# Patient Record
Sex: Male | Born: 1945 | Race: White | Hispanic: No | Marital: Single | Smoking: Current every day smoker
Health system: Southern US, Community
[De-identification: ages and names within clinical notes are randomized; demographics above are authoritative.]

## PROBLEM LIST (undated history)

## (undated) DIAGNOSIS — Z9289 Personal history of other medical treatment: Secondary | ICD-10-CM

## (undated) DIAGNOSIS — J449 Chronic obstructive pulmonary disease, unspecified: Secondary | ICD-10-CM

## (undated) DIAGNOSIS — C3490 Malignant neoplasm of unspecified part of unspecified bronchus or lung: Secondary | ICD-10-CM

## (undated) DIAGNOSIS — R918 Other nonspecific abnormal finding of lung field: Secondary | ICD-10-CM

## (undated) DIAGNOSIS — J189 Pneumonia, unspecified organism: Secondary | ICD-10-CM

## (undated) DIAGNOSIS — J45909 Unspecified asthma, uncomplicated: Secondary | ICD-10-CM

## (undated) HISTORY — PX: LUNG LOBECTOMY: SHX167

## (undated) SURGERY — Surgical Case
Anesthesia: *Unknown

---

## 1988-01-13 DIAGNOSIS — C3490 Malignant neoplasm of unspecified part of unspecified bronchus or lung: Secondary | ICD-10-CM

## 1988-01-13 HISTORY — DX: Malignant neoplasm of unspecified part of unspecified bronchus or lung: C34.90

## 2013-11-12 DIAGNOSIS — J189 Pneumonia, unspecified organism: Secondary | ICD-10-CM

## 2013-11-12 HISTORY — DX: Pneumonia, unspecified organism: J18.9

## 2013-11-21 ENCOUNTER — Inpatient Hospital Stay: Payer: Self-pay | Admitting: Internal Medicine

## 2013-11-21 DIAGNOSIS — Z85118 Personal history of other malignant neoplasm of bronchus and lung: Secondary | ICD-10-CM | POA: Diagnosis not present

## 2013-11-21 DIAGNOSIS — J189 Pneumonia, unspecified organism: Secondary | ICD-10-CM | POA: Diagnosis not present

## 2013-11-21 DIAGNOSIS — J180 Bronchopneumonia, unspecified organism: Secondary | ICD-10-CM | POA: Diagnosis not present

## 2013-11-21 DIAGNOSIS — F17210 Nicotine dependence, cigarettes, uncomplicated: Secondary | ICD-10-CM | POA: Diagnosis not present

## 2013-11-21 DIAGNOSIS — R590 Localized enlarged lymph nodes: Secondary | ICD-10-CM | POA: Diagnosis not present

## 2013-11-21 DIAGNOSIS — J9601 Acute respiratory failure with hypoxia: Secondary | ICD-10-CM | POA: Diagnosis not present

## 2013-11-21 DIAGNOSIS — Z902 Acquired absence of lung [part of]: Secondary | ICD-10-CM | POA: Diagnosis present

## 2013-11-21 DIAGNOSIS — A419 Sepsis, unspecified organism: Secondary | ICD-10-CM | POA: Diagnosis not present

## 2013-11-21 DIAGNOSIS — J441 Chronic obstructive pulmonary disease with (acute) exacerbation: Secondary | ICD-10-CM | POA: Diagnosis not present

## 2013-11-21 DIAGNOSIS — R6520 Severe sepsis without septic shock: Secondary | ICD-10-CM | POA: Diagnosis not present

## 2013-11-21 DIAGNOSIS — J440 Chronic obstructive pulmonary disease with acute lower respiratory infection: Secondary | ICD-10-CM | POA: Diagnosis present

## 2013-11-21 DIAGNOSIS — Z720 Tobacco use: Secondary | ICD-10-CM | POA: Diagnosis not present

## 2013-11-21 DIAGNOSIS — R093 Abnormal sputum: Secondary | ICD-10-CM | POA: Diagnosis present

## 2013-11-21 LAB — COMPREHENSIVE METABOLIC PANEL
ALBUMIN: 2.9 g/dL — AB (ref 3.4–5.0)
ALT: 78 U/L — AB
ANION GAP: 8 (ref 7–16)
Alkaline Phosphatase: 95 U/L
BUN: 12 mg/dL (ref 7–18)
Bilirubin,Total: 0.5 mg/dL (ref 0.2–1.0)
CHLORIDE: 98 mmol/L (ref 98–107)
CREATININE: 0.64 mg/dL (ref 0.60–1.30)
Calcium, Total: 9 mg/dL (ref 8.5–10.1)
Co2: 31 mmol/L (ref 21–32)
EGFR (African American): >60
Glucose: 100 mg/dL — ABNORMAL HIGH (ref 65–99)
Osmolality: 274 (ref 275–301)
Potassium: 3.8 mmol/L (ref 3.5–5.1)
SGOT(AST): 49 U/L — ABNORMAL HIGH (ref 15–37)
SODIUM: 137 mmol/L (ref 136–145)
Total Protein: 8.5 g/dL — ABNORMAL HIGH (ref 6.4–8.2)

## 2013-11-21 LAB — CBC
HCT: 45.2 % (ref 40.0–52.0)
HGB: 14.5 g/dL (ref 13.0–18.0)
MCH: 26.9 pg (ref 26.0–34.0)
MCHC: 32.1 g/dL (ref 32.0–36.0)
MCV: 84 fL (ref 80–100)
Platelet: 478 10*3/uL — ABNORMAL HIGH (ref 150–440)
RBC: 5.4 10*6/uL (ref 4.40–5.90)
RDW: 13.3 % (ref 11.5–14.5)
WBC: 19.7 10*3/uL — AB (ref 3.8–10.6)

## 2013-11-21 LAB — TROPONIN I: Troponin-I: <0.02 ng/mL

## 2013-11-22 DIAGNOSIS — J9601 Acute respiratory failure with hypoxia: Secondary | ICD-10-CM | POA: Diagnosis not present

## 2013-11-22 DIAGNOSIS — Z720 Tobacco use: Secondary | ICD-10-CM | POA: Diagnosis not present

## 2013-11-22 DIAGNOSIS — Z85118 Personal history of other malignant neoplasm of bronchus and lung: Secondary | ICD-10-CM | POA: Diagnosis not present

## 2013-11-22 DIAGNOSIS — J189 Pneumonia, unspecified organism: Secondary | ICD-10-CM | POA: Diagnosis not present

## 2013-11-22 LAB — BASIC METABOLIC PANEL
Anion Gap: 7 (ref 7–16)
BUN: 13 mg/dL (ref 7–18)
CO2: 30 mmol/L (ref 21–32)
Calcium, Total: 8.4 mg/dL — ABNORMAL LOW (ref 8.5–10.1)
Chloride: 101 mmol/L (ref 98–107)
Creatinine: 0.67 mg/dL (ref 0.60–1.30)
EGFR (Non-African Amer.): >60
Glucose: 143 mg/dL — ABNORMAL HIGH (ref 65–99)
Osmolality: 278 (ref 275–301)
POTASSIUM: 4.2 mmol/L (ref 3.5–5.1)
Sodium: 138 mmol/L (ref 136–145)

## 2013-11-22 LAB — CBC WITH DIFFERENTIAL/PLATELET
BASOS ABS: 0 10*3/uL (ref 0.0–0.1)
BASOS PCT: 0.2 %
EOS ABS: 0 10*3/uL (ref 0.0–0.7)
Eosinophil %: 0 %
HCT: 37.1 % — ABNORMAL LOW (ref 40.0–52.0)
HGB: 11.9 g/dL — AB (ref 13.0–18.0)
LYMPHS ABS: 1.7 10*3/uL (ref 1.0–3.6)
LYMPHS PCT: 13.3 %
MCH: 26.9 pg (ref 26.0–34.0)
MCHC: 32 g/dL (ref 32.0–36.0)
MCV: 84 fL (ref 80–100)
Monocyte #: 0.2 x10 3/mm (ref 0.2–1.0)
Monocyte %: 1.6 %
Neutrophil #: 10.6 10*3/uL — ABNORMAL HIGH (ref 1.4–6.5)
Neutrophil %: 84.9 %
PLATELETS: 397 10*3/uL (ref 150–440)
RBC: 4.41 10*6/uL (ref 4.40–5.90)
RDW: 13.3 % (ref 11.5–14.5)
WBC: 12.5 10*3/uL — ABNORMAL HIGH (ref 3.8–10.6)

## 2013-11-26 LAB — CULTURE, BLOOD (SINGLE)

## 2013-11-27 LAB — CULTURE, BLOOD (SINGLE)

## 2013-11-29 DIAGNOSIS — J158 Pneumonia due to other specified bacteria: Secondary | ICD-10-CM | POA: Diagnosis not present

## 2013-11-29 DIAGNOSIS — Z125 Encounter for screening for malignant neoplasm of prostate: Secondary | ICD-10-CM | POA: Diagnosis not present

## 2013-11-29 DIAGNOSIS — R5383 Other fatigue: Secondary | ICD-10-CM | POA: Diagnosis not present

## 2013-11-29 DIAGNOSIS — G4701 Insomnia due to medical condition: Secondary | ICD-10-CM | POA: Diagnosis not present

## 2013-11-29 DIAGNOSIS — E756 Lipid storage disorder, unspecified: Secondary | ICD-10-CM | POA: Diagnosis not present

## 2013-11-29 DIAGNOSIS — R0602 Shortness of breath: Secondary | ICD-10-CM | POA: Diagnosis not present

## 2013-11-29 DIAGNOSIS — F17210 Nicotine dependence, cigarettes, uncomplicated: Secondary | ICD-10-CM | POA: Diagnosis not present

## 2013-11-29 DIAGNOSIS — R29890 Loss of height: Secondary | ICD-10-CM | POA: Diagnosis not present

## 2013-12-12 DIAGNOSIS — J158 Pneumonia due to other specified bacteria: Secondary | ICD-10-CM | POA: Diagnosis not present

## 2013-12-12 DIAGNOSIS — R0602 Shortness of breath: Secondary | ICD-10-CM | POA: Diagnosis not present

## 2013-12-12 DIAGNOSIS — G4701 Insomnia due to medical condition: Secondary | ICD-10-CM | POA: Diagnosis not present

## 2013-12-12 DIAGNOSIS — R29890 Loss of height: Secondary | ICD-10-CM | POA: Diagnosis not present

## 2013-12-13 DIAGNOSIS — R0602 Shortness of breath: Secondary | ICD-10-CM | POA: Diagnosis not present

## 2013-12-27 ENCOUNTER — Ambulatory Visit: Payer: Self-pay | Admitting: Internal Medicine

## 2013-12-27 DIAGNOSIS — J449 Chronic obstructive pulmonary disease, unspecified: Secondary | ICD-10-CM | POA: Diagnosis not present

## 2013-12-27 DIAGNOSIS — R634 Abnormal weight loss: Secondary | ICD-10-CM | POA: Diagnosis not present

## 2013-12-27 DIAGNOSIS — J158 Pneumonia due to other specified bacteria: Secondary | ICD-10-CM | POA: Diagnosis not present

## 2013-12-27 DIAGNOSIS — F17210 Nicotine dependence, cigarettes, uncomplicated: Secondary | ICD-10-CM | POA: Diagnosis not present

## 2013-12-27 DIAGNOSIS — J439 Emphysema, unspecified: Secondary | ICD-10-CM | POA: Diagnosis not present

## 2013-12-27 DIAGNOSIS — R918 Other nonspecific abnormal finding of lung field: Secondary | ICD-10-CM | POA: Diagnosis not present

## 2014-01-25 ENCOUNTER — Ambulatory Visit: Payer: Self-pay | Admitting: Internal Medicine

## 2014-05-05 NOTE — Discharge Summary (Signed)
PATIENT NAME:  Rick Clark, Rick Clark MR#:  [id number] DATE OF BIRTH:  27-Mar-1945  DATE OF ADMISSION:  11/21/2013 DATE OF DISCHARGE:  11/22/2013  PRESENTING COMPLAINT: Shortness of breath.   DISCHARGE DIAGNOSES:  1.  Acute chronic obstructive pulmonary disease exacerbation with emphysema and ongoing tobacco abuse.  2.  Bilateral pneumonia improving.  3.  Chronic tobacco abuse. Saturations 97% on room air.   MEDICATIONS:  1.  Keflex 500 mg p.o. 3 times a day.  2.  Azithromycin 500 mg p.o. daily. 3.  Symbicort 160/4.5 at 1 inhalation b.i.d.  4.  Spiriva 18 mcg inhalation daily.  5.  Ventolin HFA 2 puffs 4 times a day as needed.  6.  Prednisone taper as directed.  7.  Establish primary care physician in the area.   FOLLOWUP: With [facility] on 11/29/2013 at 2:15 p.m.    LABORATORY DATA: At discharge: White count is 12.5, hemoglobin and hematocrit 11.9 and 37.1 and a platelet count is 397,000.  Creatinine is 0.69.  CT of the chest shows no evidence of PE bilateral lower lobe and lingular bronchopneumonia.  There is mediastinal left hilar lymphadenopathy likely reactive, given the pulmonary findings.  Blood cultures negative in 18 to 24 hours.  Blood cultures 1 out of 2, gram-positive cocci in cluster and second culture is negative in 18 to 24 hours.   CONSULTATIONS: None.    BRIEF SUMMARY OF HOSPITAL COURSE:  [name] is a 69 year old Caucasian male with past medical history of lung cancer, status post partial pneumonectomy and ongoing tobacco abuse, comes in with increasing shortness of breath for the past 2 weeks. He has been having some cough.  He was initially hypoxic in the Emergency Room.  He was admitted with:  1.  Acute hypoxic respiratory failure secondary to chronic obstructive pulmonary disease exacerbation and was started on IV steroids, high dose, along with oral inhalers and nebulizers. The patient improved.  His saturations were 97% on room air.  His shortness of  breath was much better.  He was changed to p.o. prednisone taper and given inhalers to go home.   2.  Bilateral bronchopneumonia.   The patient was started on IV Levaquin, and since he was not able to afford it, he was changed to p.o. Keflex and Zithromax. Blood cultures 1 out of 2 was positive for gram-positive cocci  which likely is staph coag-negative coag negative. He remained afebrile. White count was down to 12,000.  3.  Ongoing tobacco abuse. The patient was advised on smoking cessation.  4.  History of lung cancer, status post partial pneumonectomy on the right.   Overall hospital stay remained stable.   CODE STATUS: The patient remained a FULL CODE.  He has been set up an appointment with [facility] to establish primary care.   TIME SPENT: 40 minutes.     ____________________________ Hart Rochester Posey Pronto, MD sap:DT D: 11/23/2013 07:03:13 ET T: 11/23/2013 11:58:13 ET JOB#: [id number]  cc: Aniqua Briere A. Posey Pronto, MD, <Dictator> [facility]   Ilda Basset MD ELECTRONICALLY SIGNED 12/14/2013 14:03

## 2014-05-05 NOTE — H&P (Signed)
PATIENT NAME:  Rick Clark, Rick Clark MR#:  [id number] DATE OF BIRTH:  [birth year]  DATE OF ADMISSION:  11/21/2013  PRIMARY CARE PHYSICIAN: The patient does not have one.  CHIEF COMPLAINT: Shortness of breath.   HISTORY OF PRESENT ILLNESS: This is a 69 year old male who presents to the hospital complaining of shortness of breath and weakness progressively getting worse over the past 2 weeks. The patient describes his symptoms as having significant exertional dyspnea now on minimal exertion. He admits to a cough but is nonproductive. Because his symptoms have progressively gotten worse, he came to the ER for further evaluation. In the Emergency Room, the patient was noted to be hypoxic with room air O2 sats in the 80s. His chest x-ray findings were suggestive of multifocal pneumonia. Hospitalist services were contacted for further treatment and evaluation. The patient denies any chest pain, any nausea, vomiting, abdominal pain, diarrhea, fevers, chills, or any other associated symptoms presently.   REVIEW OF SYSTEMS: CONSTITUTIONAL: No documented fever. Positive weakness. Positive weight loss, unknown how much amount. No weight gain.  EYES: No blurred or double vision.  ENT: No tinnitus. No postnasal drip. No redness of the oropharynx.  RESPIRATORY: Positive cough. No wheeze. No hemoptysis. Positive dyspnea.  CARDIOVASCULAR: No chest pain, no orthopnea, no palpitations, or syncope.  GASTROINTESTINAL: No nausea, no vomiting, no diarrhea, no abdominal pain, and no melena or hematochezia.  GENITOURINARY: No dysuria or hematuria.  ENDOCRINE: No polyuria or nocturia. No heat or cold intolerance.  HEMATOLOGIC: No anemia, no bruising, no bleeding.  INTEGUMENTARY: No rashes or lesions.  MUSCULOSKELETAL: No arthritis. No swelling. No gout.  NEUROLOGIC: No numbness or tingling. No ataxia. No seizure type activity. PSYCH: No anxiety. No insomnia. No ADD.   PAST MEDICAL HISTORY: Consistent with history of lung  cancer status post partial pneumonectomy, history of ongoing tobacco abuse.   ALLERGIES: No known drug allergies.   SOCIAL HISTORY: Still smokes about a pack per day, has been smoking for the past 40+ years. No alcohol abuse. No illicit drug abuse. Lives with his granddaughter.   FAMILY HISTORY: Mother and father are both deceased. Mother was diabetic. Father died from old age.   CURRENT MEDICATIONS: He is currently on no medications.   PHYSICAL EXAMINATION: Presently is as follows:  VITAL SIGNS: Temperature 97.8, pulse 83, respirations 18, blood pressure 93/52, sats 83% on room air.  GENERAL: He is a cachectic emaciated male but in no apparent distress.  HEAD, EYES, EARS, NOSE AND THROAT: He is atraumatic, normocephalic. Extraocular muscles are intact. Pupils are equal and reactive to light. Sclerae anicteric. No conjunctival injection. No pharyngeal erythema.  NECK: Supple. There is no jugular venous distention. No bruits. No lymphadenopathy or thyromegaly.  HEART: Regular rate and rhythm. Distant heart sounds. No murmurs, no rubs, no clicks.  LUNGS: Prolonged inspiratory and expiratory phase, minimal end-expiratory wheezing. No rhonchi. No rales. Negative use of accessory muscles. No dullness to percussion.  ABDOMEN: Soft, flat, nontender, nondistended. Has good bowel sounds. No hepatosplenomegaly appreciated.  EXTREMITIES: No evidence of any cyanosis. Positive clubbing. No peripheral edema. +2 pedal and radial pulses bilaterally.  NEUROLOGIC: The patient is alert, awake and oriented x3 with no focal motor or sensory deficits appreciated bilaterally.  SKIN: Moist and warm with no rashes appreciated.  LYMPHATIC: There is no cervical or axillary lymphadenopathy.   DIAGNOSTIC DATA: Serum glucose 100, BUN 12, creatinine 0.6, sodium 137, potassium 3.8, chloride 98, bicarb 31. LFTs are within normal limits. Troponin less than 0.02. White  cell count 8.7, hemoglobin 14.5, hematocrit 45.2, platelet  count 478,000.   The patient did have a chest x-ray done which shows partial pneumonectomy on the right, patchy bronchopneumonia in both mid and lower lung fields.   ASSESSMENT AND PLAN: This is a 69 year old male with a history of lung cancer status post partial pneumonectomy, ongoing tobacco abuse, presents to the hospital with shortness of breath, weakness and noted to be in chronic obstructive pulmonary disease exacerbation due to pneumonia.  1.  Chronic obstructive pulmonary disease exacerbation. This is likely due to multifocal pneumonia. I will treat the patient with IV steroids, around-the-clock nebulizer treatments, start him on some Symbicort and Spiriva, and give him IV Levaquin for his pneumonia. Follow him clinically. We will assess him for home oxygen prior to discharge.  2.  Pneumonia. This is likely community-acquired pneumonia. Will treat the patient with IV Levaquin, follow blood and sputum cultures.  3.  Leukocytosis, secondary to the pneumonia. We will follow white cell count with IV antibiotic therapy.  4.  History of lung cancer status post pneumonectomy. I will get a CT chest with contrast to rule out underlying lung mass possibly, also work-up for possible underlying pulmonary embolism.   CODE STATUS: FULL.  TIME SPENT ON ADMISSION: 50 minutes.  ____________________________ Belia Heman. Verdell Carmine, MD vjs:sb D: 11/21/2013 67:20:94 ET T: 11/21/2013 14:34:10 ET JOB#: [id number]  cc: Belia Heman. Verdell Carmine, MD, <Dictator> Henreitta Leber MD ELECTRONICALLY SIGNED 12/01/2013 12:39

## 2014-06-22 ENCOUNTER — Other Ambulatory Visit: Payer: Self-pay | Admitting: Physician Assistant

## 2014-06-22 ENCOUNTER — Inpatient Hospital Stay
Admission: EM | Admit: 2014-06-22 | Discharge: 2014-07-02 | DRG: 193 | Disposition: A | Discharge status: Home Health | Payer: PPO | Attending: Internal Medicine | Admitting: Internal Medicine

## 2014-06-22 ENCOUNTER — Ambulatory Visit
Admission: RE | Admit: 2014-06-22 | Discharge: 2014-06-22 | Disposition: A | Discharge status: Home / Self Care | Payer: PPO | Source: Ambulatory Visit | Attending: Physician Assistant | Admitting: Physician Assistant

## 2014-06-22 ENCOUNTER — Encounter: Payer: Self-pay | Admitting: Emergency Medicine

## 2014-06-22 DIAGNOSIS — F17210 Nicotine dependence, cigarettes, uncomplicated: Secondary | ICD-10-CM | POA: Diagnosis present

## 2014-06-22 DIAGNOSIS — R0602 Shortness of breath: Secondary | ICD-10-CM

## 2014-06-22 DIAGNOSIS — Z7951 Long term (current) use of inhaled steroids: Secondary | ICD-10-CM | POA: Diagnosis not present

## 2014-06-22 DIAGNOSIS — Z8249 Family history of ischemic heart disease and other diseases of the circulatory system: Secondary | ICD-10-CM

## 2014-06-22 DIAGNOSIS — R079 Chest pain, unspecified: Secondary | ICD-10-CM

## 2014-06-22 DIAGNOSIS — J189 Pneumonia, unspecified organism: Secondary | ICD-10-CM | POA: Diagnosis present

## 2014-06-22 DIAGNOSIS — J449 Chronic obstructive pulmonary disease, unspecified: Secondary | ICD-10-CM | POA: Diagnosis not present

## 2014-06-22 DIAGNOSIS — R0781 Pleurodynia: Secondary | ICD-10-CM

## 2014-06-22 DIAGNOSIS — J918 Pleural effusion in other conditions classified elsewhere: Secondary | ICD-10-CM

## 2014-06-22 DIAGNOSIS — R627 Adult failure to thrive: Secondary | ICD-10-CM | POA: Diagnosis present

## 2014-06-22 DIAGNOSIS — J90 Pleural effusion, not elsewhere classified: Secondary | ICD-10-CM | POA: Diagnosis not present

## 2014-06-22 DIAGNOSIS — Z79899 Other long term (current) drug therapy: Secondary | ICD-10-CM

## 2014-06-22 DIAGNOSIS — Z85118 Personal history of other malignant neoplasm of bronchus and lung: Secondary | ICD-10-CM | POA: Diagnosis not present

## 2014-06-22 DIAGNOSIS — Z681 Body mass index (BMI) 19 or less, adult: Secondary | ICD-10-CM

## 2014-06-22 DIAGNOSIS — J181 Lobar pneumonia, unspecified organism: Secondary | ICD-10-CM

## 2014-06-22 DIAGNOSIS — E43 Unspecified severe protein-calorie malnutrition: Secondary | ICD-10-CM | POA: Diagnosis present

## 2014-06-22 DIAGNOSIS — J948 Other specified pleural conditions: Secondary | ICD-10-CM | POA: Diagnosis not present

## 2014-06-22 DIAGNOSIS — J869 Pyothorax without fistula: Secondary | ICD-10-CM | POA: Diagnosis not present

## 2014-06-22 HISTORY — DX: Pneumonia, unspecified organism: J18.9

## 2014-06-22 HISTORY — DX: Malignant neoplasm of unspecified part of unspecified bronchus or lung: C34.90

## 2014-06-22 LAB — COMPREHENSIVE METABOLIC PANEL
ALK PHOS: 82 U/L (ref 38–126)
ALT: 11 U/L — AB (ref 17–63)
ANION GAP: 10 (ref 5–15)
AST: 16 U/L (ref 15–41)
Albumin: 3.4 g/dL — ABNORMAL LOW (ref 3.5–5.0)
BUN: 13 mg/dL (ref 6–20)
CALCIUM: 8.9 mg/dL (ref 8.9–10.3)
CHLORIDE: 93 mmol/L — AB (ref 101–111)
CO2: 30 mmol/L (ref 22–32)
Creatinine, Ser: 0.64 mg/dL (ref 0.61–1.24)
GFR calc Af Amer: >60 mL/min (ref >60)
Glucose, Bld: 141 mg/dL — ABNORMAL HIGH (ref 65–99)
Potassium: 3.9 mmol/L (ref 3.5–5.1)
SODIUM: 133 mmol/L — AB (ref 135–145)
TOTAL PROTEIN: 7.8 g/dL (ref 6.5–8.1)
Total Bilirubin: 0.6 mg/dL (ref 0.3–1.2)

## 2014-06-22 LAB — PROTIME-INR
INR: 1.24
Prothrombin Time: 15.8 seconds — ABNORMAL HIGH (ref 11.4–15.0)

## 2014-06-22 LAB — CBC WITH DIFFERENTIAL/PLATELET
BASOS PCT: 0 %
Basophils Absolute: 0.1 10*3/uL (ref 0–0.1)
EOS ABS: 0 10*3/uL (ref 0–0.7)
EOS PCT: 0 %
HCT: 43.7 % (ref 40.0–52.0)
Hemoglobin: 14.2 g/dL (ref 13.0–18.0)
LYMPHS ABS: 1 10*3/uL (ref 1.0–3.6)
Lymphocytes Relative: 5 %
MCH: 27.2 pg (ref 26.0–34.0)
MCHC: 32.6 g/dL (ref 32.0–36.0)
MCV: 83.5 fL (ref 80.0–100.0)
Monocytes Absolute: 1.3 10*3/uL — ABNORMAL HIGH (ref 0.2–1.0)
Monocytes Relative: 6 %
NEUTROS PCT: 89 %
Neutro Abs: 17.7 10*3/uL — ABNORMAL HIGH (ref 1.4–6.5)
PLATELETS: 306 10*3/uL (ref 150–440)
RBC: 5.23 MIL/uL (ref 4.40–5.90)
RDW: 13.9 % (ref 11.5–14.5)
WBC: 20 10*3/uL — AB (ref 3.8–10.6)

## 2014-06-22 LAB — TROPONIN I: Troponin I: <0.03 ng/mL (ref <0.031)

## 2014-06-22 MED ORDER — MORPHINE SULFATE 2 MG/ML IJ SOLN
2.0000 mg | INTRAMUSCULAR | Status: DC | PRN
  Administered 2014-06-23 – 2014-06-26 (×16): 2 mg via INTRAVENOUS
  Filled 2014-06-22 (×15): qty 1

## 2014-06-22 MED ORDER — SODIUM CHLORIDE 0.9 % IV SOLN
Freq: Once | INTRAVENOUS | Status: AC
  Administered 2014-06-22: 21:00:00 via INTRAVENOUS

## 2014-06-22 MED ORDER — VANCOMYCIN HCL 500 MG IV SOLR
500.0000 mg | INTRAVENOUS | Status: AC
  Administered 2014-06-22: 500 mg via INTRAVENOUS
  Filled 2014-06-22 (×2): qty 500

## 2014-06-22 MED ORDER — ALBUTEROL SULFATE (2.5 MG/3ML) 0.083% IN NEBU: 3.0000 mL | INHALATION_SOLUTION | Freq: Four times a day (QID) | RESPIRATORY_TRACT | Status: DC | PRN

## 2014-06-22 MED ORDER — VANCOMYCIN HCL IN DEXTROSE 1-5 GM/200ML-% IV SOLN: 1000.0000 mg | Freq: Once | INTRAVENOUS | Status: DC

## 2014-06-22 MED ORDER — HEPARIN SODIUM (PORCINE) 5000 UNIT/ML IJ SOLN
5000.0000 [IU] | Freq: Three times a day (TID) | INTRAMUSCULAR | Status: DC
  Administered 2014-06-22 – 2014-06-23 (×2): 5000 [IU] via SUBCUTANEOUS
  Filled 2014-06-22 (×2): qty 1

## 2014-06-22 MED ORDER — LEVOFLOXACIN IN D5W 750 MG/150ML IV SOLN
750.0000 mg | Freq: Once | INTRAVENOUS | Status: AC
  Administered 2014-06-22: 750 mg via INTRAVENOUS

## 2014-06-22 MED ORDER — LEVOFLOXACIN IN D5W 750 MG/150ML IV SOLN
INTRAVENOUS | Status: AC
  Administered 2014-06-22: 750 mg via INTRAVENOUS
  Filled 2014-06-22: qty 150

## 2014-06-22 MED ORDER — ACETAMINOPHEN 325 MG PO TABS: 650.0000 mg | ORAL_TABLET | Freq: Four times a day (QID) | ORAL | Status: DC | PRN

## 2014-06-22 MED ORDER — BUDESONIDE-FORMOTEROL FUMARATE 160-4.5 MCG/ACT IN AERO
2.0000 | INHALATION_SPRAY | Freq: Two times a day (BID) | RESPIRATORY_TRACT | Status: DC
  Administered 2014-06-22 – 2014-07-02 (×19): 2 via RESPIRATORY_TRACT
  Filled 2014-06-22: qty 6

## 2014-06-22 MED ORDER — ONDANSETRON HCL 4 MG/2ML IJ SOLN: 4.0000 mg | Freq: Four times a day (QID) | INTRAMUSCULAR | Status: DC | PRN

## 2014-06-22 MED ORDER — ONDANSETRON HCL 4 MG PO TABS: 4.0000 mg | ORAL_TABLET | Freq: Four times a day (QID) | ORAL | Status: DC | PRN

## 2014-06-22 MED ORDER — IBUPROFEN 400 MG PO TABS: 400.0000 mg | ORAL_TABLET | Freq: Four times a day (QID) | ORAL | Status: DC | PRN

## 2014-06-22 MED ORDER — SODIUM CHLORIDE 0.9 % IV SOLN
INTRAVENOUS | Status: AC
Start: 2014-06-22 — End: 2014-06-23
  Administered 2014-06-22 – 2014-06-23 (×3): via INTRAVENOUS

## 2014-06-22 MED ORDER — IOHEXOL 350 MG/ML SOLN
75.0000 mL | Freq: Once | INTRAVENOUS | Status: AC | PRN
  Administered 2014-06-22: 75 mL via INTRAVENOUS

## 2014-06-22 MED ORDER — MORPHINE SULFATE 2 MG/ML IJ SOLN
2.0000 mg | Freq: Once | INTRAMUSCULAR | Status: AC
  Administered 2014-06-22: 2 mg via INTRAVENOUS

## 2014-06-22 MED ORDER — VANCOMYCIN HCL 500 MG IV SOLR
500.0000 mg | INTRAVENOUS | Status: DC
  Administered 2014-06-23 – 2014-06-24 (×2): 500 mg via INTRAVENOUS
  Filled 2014-06-22 (×5): qty 500

## 2014-06-22 MED ORDER — ACETAMINOPHEN 650 MG RE SUPP: 650.0000 mg | Freq: Four times a day (QID) | RECTAL | Status: DC | PRN

## 2014-06-22 MED ORDER — MORPHINE SULFATE 2 MG/ML IJ SOLN
INTRAMUSCULAR | Status: AC
  Administered 2014-06-22: 2 mg via INTRAVENOUS
  Filled 2014-06-22: qty 1

## 2014-06-22 MED ORDER — TIOTROPIUM BROMIDE MONOHYDRATE 18 MCG IN CAPS
18.0000 ug | ORAL_CAPSULE | Freq: Every day | RESPIRATORY_TRACT | Status: DC
  Administered 2014-06-23 – 2014-07-02 (×9): 18 ug via RESPIRATORY_TRACT
  Filled 2014-06-22 (×3): qty 5

## 2014-06-22 NOTE — ED Provider Notes (Signed)
[facility] Emergency Department Provider Note  ____________________________________________  Time seen: Approximately 7:12 PM  I have reviewed the triage vital signs and the nursing notes.   HISTORY  Chief Complaint Pneumonia  Patient and spouse strands HPI Rick Clark is a 69 y.o. male presents to the ER for complaints of left lateral side pain. Patient states that he was evaluated by his primary care physician and had outpatient CT of his chest done today and was then sent to the ER for further medical evaluation directly from CT. Patient at this time complains of left lateral rib pain states pain is currently a 5 out of 10, states pain is mostly with movement or deep inspiration. Patient denies need for pain medicines at this time.  Patient states that the original onset of this left lateral rib pain was Wednesday and states has been persistent since. Patient denies fall or injury. Denies denies shortness of breath however states that it feels that he cannot take a deep breath in. Denies other pain. Denies abdominal pain, nausea, vomiting, diarrhea. Denies appetite change. Reports continues to eat and drink well.    History reviewed. No pertinent past medical history. 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 Pneumonia 06/22/2014    History reviewed. No pertinent past surgical history.  Current Outpatient Rx  Name  Route  Sig  Dispense  Refill  . albuterol (PROVENTIL HFA;VENTOLIN HFA) 108 (90 BASE) MCG/ACT inhaler   Inhalation   Inhale 1 puff into the lungs every 6 (six) hours as needed for wheezing or shortness of breath.         . budesonide-formoterol (SYMBICORT) 160-4.5 MCG/ACT inhaler   Inhalation   Inhale 2 puffs into the lungs 2 (two) times daily.         Marland Kitchen ibuprofen (ADVIL,MOTRIN) 200 MG tablet   Oral   Take 400 mg by mouth every 6 (six) hours as needed for headache or mild  pain.         Marland Kitchen tiotropium (SPIRIVA) 18 MCG inhalation capsule   Inhalation   Place 18 mcg into inhaler and inhale daily.          PCP: [facility] Allerg[facility] Review of patient's allergies indicates no known allergies.  No family history on file.  Social History History  Substance Use Topics  . Smoking status: Current Every Day Smoker -- 0.50 packs/day for 55 years    Types: Cigarettes  . Smokeless tobacco: Not on file  . Alcohol Use: No    Review of Systems Constitutional: No fever/chills Eyes: No visual changes. ENT: No sore throat. Cardiovascular: Denies chest pain. Respiratory: Denies shortness of breath. Reports unable to take deep inspiration. Gastrointestinal: No abdominal pain.  No nausea, no vomiting.  No diarrhea.  No constipation. Genitourinary: Negative for dysuria. Musculoskeletal: Negative for back pain. Positive for left lateral rib pain Skin: Negative for rash. Neurological: Negative for headaches, focal weakness or numbness.  10-point ROS otherwise negative.  ____________________________________________   PHYSICAL EXAM:  VITAL SIGNS: ED Triage Vitals  Enc Vitals Group     BP 06/22/14 1830 129/63 mmHg     Pulse Rate 06/22/14 1805 102     Resp 06/22/14 1805 18     Temp 06/22/14 1805 97.8 F (36.6 C)     Temp Source 06/22/14 1805 Oral     SpO2 06/22/14 1805 95 %     Weight 06/22/14 1805 88 lb (  39.917 kg)     Height 06/22/14 1805 '5\' 5"'$  (1.651 m)     Head Cir --      Peak Flow --      Pain Score 06/22/14 1812 4     Pain Loc --      Pain Edu? --      Excl. in [place]? --    Today's Vitals   06/22/14 1845 06/22/14 1847 06/22/14 1900 06/22/14 1930  BP:   123/57 112/55  Pulse:   90 88  Temp:      TempSrc:      Resp: 23   31  Height:      Weight:      SpO2:  96% 92% 97%  PainSc:       Constitutional: Alert and oriented. Well appearing and in no acute distress. Eyes: Conjunctivae are normal. PERRL. EOMI. Head: Atraumatic. Nose: No  congestion/rhinnorhea. Mouth/Throat: Mucous membranes are moist.  Oropharynx non-erythematous. Neck: No stridor.  No cervical spine tenderness to palpation. Hematological/Lymphatic/Immunilogical: No cervical lymphadenopathy. Cardiovascular: Normal rate, regular rhythm. Grossly normal heart sounds.  Good peripheral circulation. Respiratory: Decreased lung sounds left lower lobe. Dry intermittent cough noted. No wheezes. No retractions. Gastrointestinal: Soft and nontender. No distention. No abdominal bruits. No CVA tenderness. Musculoskeletal: No lower extremity tenderness nor edema.  No joint effusions. No cervical, thoracic or lumbar tenderness to palpation. Left lateral ribs nontender to palpation. Pain to left lateral ribs increased with flexion and rotation per patient, however again noted nontender to palpation. Distal pedal pulses equal bilaterally and easily found. Neurologic:  Normal speech and language. No gross focal neurologic deficits are appreciated. Speech is normal. No gait instability. Skin:  Skin is warm, dry and intact. No rash noted. Psychiatric: Mood and affect are normal. Speech and behavior are normal.  ____________________________________________   LABS (all labs ordered are listed, but only abnormal results are displayed)  Labs Reviewed  CBC WITH DIFFERENTIAL/PLATELET - Abnormal; Notable for the following:    WBC 20.0 (*)    Neutro Abs 17.7 (*)    Monocytes Absolute 1.3 (*)    All other components within normal limits  COMPREHENSIVE METABOLIC PANEL - Abnormal; Notable for the following:    Sodium 133 (*)    Chloride 93 (*)    Glucose, Bld 141 (*)    Albumin 3.4 (*)    ALT 11 (*)    All other components within normal limits  CULTURE, BLOOD (ROUTINE X 2)  CULTURE, BLOOD (ROUTINE X 2)  TROPONIN I   ____________________________________________  EKG  See  Dr. Lucilla Lame dictation ____________________________________________  RADIOLOGY  CT ANGIOGRAPHY  CHEST WITH CONTRAST  TECHNIQUE: Multidetector CT imaging of the chest was performed using the standard protocol during bolus administration of intravenous contrast. Multiplanar CT image reconstructions and MIPs were obtained to evaluate the vascular anatomy.  CONTRAST: 59m OMNIPAQUE IOHEXOL 350 MG/ML SOLN  COMPARISON: 01/25/2014  FINDINGS: Mediastinum/Nodes: The quality of this exam for evaluation of pulmonary embolism is good. No evidence of pulmonary embolism.  Advanced atherosclerosis, including at the origin of the left subclavian artery. Normal heart size, without pericardial effusion.  A subcarinal node is increased at 1.0 cm today versus 8 mm on the prior. Not pathologic by size criteria.  Left infrahilar soft tissue fullness is new or increased. Example at 11 mm on image 96 of series 6.  Lungs/Pleura: Trace right pleural thickening with pleural calcifications. A left-sided pleural effusion is small. Demonstrates loculation, including laterally and medially on image  111.  Fluid within the endobronchial tree, worse on the left, including to the left lower lobe. Advanced centrilobular emphysema with bullous disease at the apices and pleural-parenchymal biapical scarring.  New patchy right lower lobe airspace disease. Left apical opacity is relatively linear at maximally 6 mm on image 34. Slightly increased since the prior exam.  Favor left-sided pleural thickening anteriorly on image 44.  Peripheral left lower lobe airspace disease. Areas of relative hypoattenuation within, including on image 133 of series 6.  Upper abdomen: Well-circumscribed tiny low-density liver lesions are likely cysts or bile duct hamartomas. Normal imaged portions of the spleen, pancreas, left kidney. Left adrenal gland mildly thickened. Right adrenal gland not imaged.  Musculoskeletal: No acute osseous abnormality.  Review of the MIP images confirms the above  findings.  IMPRESSION: 1. No evidence of pulmonary embolism. 2. Left worse than right bibasilar airspace disease is new and suspicious for infection or aspiration. Areas of left lower lobe hypo enhancement for which necrosis cannot be excluded. 3. Left-sided pleural effusion with loculation. Likely parapneumonic. No significant pleural enhancement to confirm empyema. Thoracentesis should be considered. 4. Advanced centrilobular emphysema. Left apical linear opacity is slightly increased in thickness since the prior exam. Possibly scarring but warrants followup attention. 5. Left infrahilar soft tissue fullness is favored to be related to reactive adenopathy. 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 Recommend follow-up with chest CT at approximately 6-12 weeks with special attention to this area. 6. Fluid in the endobronchial tree, especially the left lower lobe. Clinically correlate for aspiration. 7. Right pleural thickening and calcification, likely related to prior empyema or hemothorax. These results will be called to the ordering clinician or representative by the Radiology Department at the imaging location.    Electronically Signed  By: Abigail Miyamoto M.D.  On: 06/22/2014 17:17 ____________________________________________    ____________________________________________   INITIAL IMPRESSION / ASSESSMENT AND PLAN / ED COURSE  Pertinent labs & imaging results that were available during my care of the patient were reviewed by me and considered in my medical decision making (see chart for details).  Well-appearing patient. Presents the ER directed from CT due to abnormal CT chest findings. Discussed patient and planning care with Dr. Kerman Passey who also reviewed CT chest. Patient with elevated WBC 20 and Concern for left lower lobar  pneumonia and left lower pleural effusion with loculation.   1900 discussed patient and CT findings with Dr. Humphrey Rolls- pulmonology. Per Dr. Chancy Milroy patient to be admitted with IV antibiotics and fluids and he will consult. Per Dr. Chancy Milroy he reports patient will likely undergo a thoracentesis due to concern for possible left lower lung empyema.   Discussed with with Dr Marcille Blanco hospitalist who will resume care. Blood cultures obtained. Admit for IV antibiotics. IV levaquin given in ER. Patient and spouse agreed to plan.  ____________________________________________   FINAL CLINICAL IMPRESSION(S) / ED DIAGNOSES  Final diagnoses:  Left lower lobe pneumonia  Rib pain on left side     Marylene Land, NP 06/22/14 2215  Harvest Dark, MD 06/22/14 2324

## 2014-06-22 NOTE — ED Provider Notes (Signed)
-----------------------------------------   7:12 PM on 06/22/2014 -----------------------------------------  EKG reviewed and interpreted by myself shows sinus rhythm at 91 bpm, narrow QRS, normal axis, normal intervals, nonspecific ST changes. No ST elevations noted.  Harvest Dark, MD 06/22/14 [phone]

## 2014-06-22 NOTE — ED Notes (Signed)
Pt awoke Wednesday with left sided pain, pt reports sob and trouble breathing. Pt is in no acute distress at this time

## 2014-06-22 NOTE — ED Provider Notes (Signed)
-----------------------------------------   8:39 PM on 06/22/2014 -----------------------------------------  EKG reviewed and interpreted by myself shows sinus rhythm at 91 bpm, narrow QRS, normal axis, normal intervals, nonspecific ST changes but no ST elevations noted.  Harvest Dark, MD 06/22/14 2040

## 2014-06-22 NOTE — ED Notes (Signed)
Pt sent from CT with IV in place .  MD called with report, states pt has left sided pneumonia and needs further eval for possible admission under Dr. Humphrey Rolls.Marland Kitchen

## 2014-06-23 ENCOUNTER — Encounter: Payer: Self-pay | Admitting: Internal Medicine

## 2014-06-23 DIAGNOSIS — J189 Pneumonia, unspecified organism: Secondary | ICD-10-CM

## 2014-06-23 DIAGNOSIS — J918 Pleural effusion in other conditions classified elsewhere: Secondary | ICD-10-CM

## 2014-06-23 DIAGNOSIS — R627 Adult failure to thrive: Secondary | ICD-10-CM

## 2014-06-23 MED ORDER — LEVOFLOXACIN IN D5W 750 MG/150ML IV SOLN
750.0000 mg | INTRAVENOUS | Status: DC
  Filled 2014-06-23: qty 150

## 2014-06-23 MED ORDER — NICOTINE 21 MG/24HR TD PT24
21.0000 mg | MEDICATED_PATCH | Freq: Every day | TRANSDERMAL | Status: DC
  Filled 2014-06-23 (×6): qty 1

## 2014-06-23 NOTE — H&P (Addendum)
Rick Clark is an 69 y.o. male.   Chief Complaint: Cough HPI: The patient presents emergency department complaining of 2 days of cough and progressive weakness. The cough has been nonproductive. The patient denies fevers or chills but states that he barely had the strength to pick up a cup of coffee. In the emergency department the patient underwent a CT of the chest which showed multilobar pneumonia as well as a left sided pleural effusion. There is some right pleural thickening as well concerning for possible malignancy. Due to the patient's age and possible empyema emergency department staff called for admission  Past Medical History  Diagnosis Date  . Lung cancer   . Pneumonia Nov '15    Past Surgical History  Procedure Laterality Date  . Lung lobectomy Right     Upper lobe    Family History  Problem Relation Age of Onset  . Coronary artery disease Father    Social History:  reports that he has been smoking Cigarettes.  He has a 27.5 pack-year smoking history. He does not have any smokeless tobacco history on file. He reports that he does not drink alcohol or use illicit drugs.  Allergies: No Known Allergies  Medications Prior to Admission  Medication Sig Dispense Refill  . albuterol (PROVENTIL HFA;VENTOLIN HFA) 108 (90 BASE) MCG/ACT inhaler Inhale 1 puff into the lungs every 6 (six) hours as needed for wheezing or shortness of breath.    . budesonide-formoterol (SYMBICORT) 160-4.5 MCG/ACT inhaler Inhale 2 puffs into the lungs 2 (two) times daily.    Marland Kitchen ibuprofen (ADVIL,MOTRIN) 200 MG tablet Take 400 mg by mouth every 6 (six) hours as needed for headache or mild pain.    Marland Kitchen tiotropium (SPIRIVA) 18 MCG inhalation capsule Place 18 mcg into inhaler and inhale daily.      Results for orders placed or performed during the hospital encounter of 06/22/14 (from the past 48 hour(s))  CBC with Differential     Status: Abnormal   Collection Time: 06/22/14  7:00 PM  Result Value Ref  Range   WBC 20.0 (H) 3.8 - 10.6 K/uL   RBC 5.23 4.40 - 5.90 MIL/uL   Hemoglobin 14.2 13.0 - 18.0 g/dL   HCT 43.7 40.0 - 52.0 %   MCV 83.5 80.0 - 100.0 fL   MCH 27.2 26.0 - 34.0 pg   MCHC 32.6 32.0 - 36.0 g/dL   RDW 13.9 11.5 - 14.5 %   Platelets 306 150 - 440 K/uL   Neutrophils Relative % 89 %   Neutro Abs 17.7 (H) 1.4 - 6.5 K/uL   Lymphocytes Relative 5 %   Lymphs Abs 1.0 1.0 - 3.6 K/uL   Monocytes Relative 6 %   Monocytes Absolute 1.3 (H) 0.2 - 1.0 K/uL   Eosinophils Relative 0 %   Eosinophils Absolute 0.0 0 - 0.7 K/uL   Basophils Relative 0 %   Basophils Absolute 0.1 0 - 0.1 K/uL  Comprehensive metabolic panel     Status: Abnormal   Collection Time: 06/22/14  7:00 PM  Result Value Ref Range   Sodium 133 (L) 135 - 145 mmol/L   Potassium 3.9 3.5 - 5.1 mmol/L   Chloride 93 (L) 101 - 111 mmol/L   CO2 30 22 - 32 mmol/L   Glucose, Bld 141 (H) 65 - 99 mg/dL   BUN 13 6 - 20 mg/dL   Creatinine, Ser 0.64 0.61 - 1.24 mg/dL   Calcium 8.9 8.9 - 10.3 mg/dL   Total  Protein 7.8 6.5 - 8.1 g/dL   Albumin 3.4 (L) 3.5 - 5.0 g/dL   AST 16 15 - 41 U/L   ALT 11 (L) 17 - 63 U/L   Alkaline Phosphatase 82 38 - 126 U/L   Total Bilirubin 0.6 0.3 - 1.2 mg/dL   GFR calc non Af Amer >60 >60 mL/min   GFR calc Af Amer >60 >60 mL/min    Comment: (NOTE) The eGFR has been calculated using the CKD EPI equation. This calculation has not been validated in all clinical situations. eGFR's persistently <60 mL/min signify possible Chronic Kidney Disease.    Anion gap 10 5 - 15  Troponin I     Status: None   Collection Time: 06/22/14  7:00 PM  Result Value Ref Range   Troponin I <0.03 <0.031 ng/mL    Comment:        NO INDICATION OF MYOCARDIAL INJURY.   Protime-INR     Status: Abnormal   Collection Time: 06/22/14 11:30 PM  Result Value Ref Range   Prothrombin Time 15.8 (H) 11.4 - 15.0 seconds   INR 1.24    Ct Angio Chest Pe W/cm &/or Wo Cm  06/22/2014   CLINICAL DATA:  Left-sided pain for 3 days.  Smoker for 57 years. Lung cancer in 1980. Lobectomy. Shortness of breath.  EXAM: CT ANGIOGRAPHY CHEST WITH CONTRAST  TECHNIQUE: Multidetector CT imaging of the chest was performed using the standard protocol during bolus administration of intravenous contrast. Multiplanar CT image reconstructions and MIPs were obtained to evaluate the vascular anatomy.  CONTRAST:  23m OMNIPAQUE IOHEXOL 350 MG/ML SOLN  COMPARISON:  01/25/2014  FINDINGS: Mediastinum/Nodes: The quality of this exam for evaluation of pulmonary embolism is good. No evidence of pulmonary embolism.  Advanced atherosclerosis, including at the origin of the left subclavian artery. Normal heart size, without pericardial effusion.  A subcarinal node is increased at 1.0 cm today versus 8 mm on the prior. Not pathologic by size criteria.  Left infrahilar soft tissue fullness is new or increased. Example at 11 mm on image 96 of series 6.  Lungs/Pleura: Trace right pleural thickening with pleural calcifications. A left-sided pleural effusion is small. Demonstrates loculation, including laterally and medially on image 111.  Fluid within the endobronchial tree, worse on the left, including to the left lower lobe. Advanced centrilobular emphysema with bullous disease at the apices and pleural-parenchymal biapical scarring.  New patchy right lower lobe airspace disease. Left apical opacity is relatively linear at maximally 6 mm on image 34. Slightly increased since the prior exam.  Favor left-sided pleural thickening anteriorly on image 44.  Peripheral left lower lobe airspace disease. Areas of relative hypoattenuation within, including on image 133 of series 6.  Upper abdomen: Well-circumscribed tiny low-density liver lesions are likely cysts or bile duct hamartomas. Normal imaged portions of the spleen, pancreas, left kidney. Left adrenal gland mildly thickened. Right adrenal gland not imaged.  Musculoskeletal: No acute osseous abnormality.  Review of the MIP  images confirms the above findings.  IMPRESSION: 1. No evidence of pulmonary embolism. 2. Left worse than right bibasilar airspace disease is new and suspicious for infection or aspiration. Areas of left lower lobe hypo enhancement for which necrosis cannot be excluded. 3. Left-sided pleural effusion with loculation. Likely parapneumonic. No significant pleural enhancement to confirm empyema. Thoracentesis should be considered. 4. Advanced centrilobular emphysema. Left apical linear opacity is slightly increased in thickness since the prior exam. Possibly scarring but warrants followup attention. 5. Left  infrahilar soft tissue fullness is favored to be related to reactive adenopathy. 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 Recommend follow-up with chest CT at approximately 6-12 weeks with special attention to this area. 6. Fluid in the endobronchial tree, especially the left lower lobe. Clinically correlate for aspiration. 7. Right pleural thickening and calcification, likely related to prior empyema or hemothorax. These results will be called to the ordering clinician or representative by the Radiology Department at the imaging location.   Electronically Signed   By: Abigail Miyamoto M.D.   On: 06/22/2014 17:17    Review of Systems  Constitutional: Positive for weight loss. Negative for fever and chills.  HENT: Negative for sore throat and tinnitus.   Eyes: Negative for blurred vision and redness.  Respiratory: Positive for cough and shortness of breath (Exertional).        Nonproductive  Cardiovascular: Negative for chest pain, palpitations, orthopnea and PND.  Gastrointestinal: Negative for nausea, vomiting, abdominal pain and diarrhea.  Genitourinary: Negative for dysuria, urgency and frequency.  Musculoskeletal: Negative for myalgias and joint pain.  Skin: Negative  for rash.       No lesions  Neurological: Positive for weakness. Negative for speech change and focal weakness.  Endo/Heme/Allergies: Does not bruise/bleed easily.       No temperature intolerance  Psychiatric/Behavioral: Negative for depression and suicidal ideas.    Blood pressure 110/51, pulse 96, temperature 98.7 F (37.1 C), temperature source Oral, resp. rate 20, height _0  (1.651 m), weight 40.461 kg (89 lb 3.2 oz), SpO2 100 %. Physical Exam  Nursing note and vitals reviewed. Constitutional: He is oriented to person, place, and time. He appears well-developed and well-nourished.  HENT:  Head: Normocephalic and atraumatic.  Mouth/Throat: Oropharynx is clear and moist.  Eyes: Conjunctivae and EOM are normal. Pupils are equal, round, and reactive to light. No scleral icterus.  Neck: Normal range of motion. Neck supple. No JVD present. No tracheal deviation present. No thyromegaly present.  Cardiovascular: Normal rate, regular rhythm, normal heart sounds and intact distal pulses.  Exam reveals no gallop and no friction rub.   No murmur heard. Respiratory: Effort normal. No respiratory distress. He has decreased breath sounds in the right lower field and the left lower field.  GI: Soft. Bowel sounds are normal. He exhibits no distension.  Genitourinary:  Deferred  Musculoskeletal: Normal range of motion. He exhibits no edema.  Lymphadenopathy:    He has no cervical adenopathy.  Neurological: He is alert and oriented to person, place, and time. No cranial nerve deficit.  Skin: Skin is warm and dry. No rash noted. No erythema.  Psychiatric: He has a normal mood and affect. His behavior is normal. Judgment and thought content normal.     Assessment/Plan This is a 69 year old Caucasian male admitted for pneumonia with parapneumonic effusion. 1. Pneumonia: Community-acquired, multilobar. Unclear if postobstructive but there is a concern for malignancy as there is left-sided hilar  soft tissue fullness. The left-sided pleural effusion is loculated areas of hypoattenuation which could represent necrosis. Thus I started the patient on vancomycin in addition to the Levaquin that he was given in the ED. Pulmonology consult ordered for possible thoracentesis. Test pleural fluid. The patient has a history of lung cancer 2. Tobacco abuse: NicoDerm patch 3. Failure to thrive: The patient is moderately malnourished; admits to approximately 15 pounds of unintentional weight loss or 6  months. He denies nausea or vomiting. He may be hypermetabolic due to malignancy. 4. DVT prophylaxis: Heparin 5. GI prophylaxis: None The patient is a full code. Time spent on admission orders and patient care approximately 35 minutes. Harrie Foreman 06/23/2014, 1:36 AM

## 2014-06-23 NOTE — Progress Notes (Signed)
[facility] [place] NAME: Rick Clark    MR#:  [id number]  DATE OF BIRTH:  1945-12-01  SUBJECTIVE:  CHIEF COMPLAINT:   Chief Complaint  Patient presents with  . Pneumonia    REVIEW OF SYSTEMS:  CONSTITUTIONAL: No fever,positive fatigue and weakness. Loosing weight. EYES: No blurred or double vision.  EARS, NOSE, AND THROAT: No tinnitus or ear pain.  RESPIRATORY: positive cough, shortness of breath, no wheezing or hemoptysis.  CARDIOVASCULAR: No chest pain, orthopnea, edema.  GASTROINTESTINAL: No nausea, vomiting, diarrhea or abdominal pain.  GENITOURINARY: No dysuria, hematuria.  ENDOCRINE: No polyuria, nocturia,  HEMATOLOGY: No anemia, easy bruising or bleeding SKIN: No rash or lesion. MUSCULOSKELETAL: No joint pain or arthritis.   NEUROLOGIC: No tingling, numbness, weakness.  PSYCHIATRY: No anxiety or depression.   ROS  DRUG ALLERGIES:  No Known Allergies  VITALS:  Blood pressure 110/56, pulse 91, temperature 98.4 F (36.9 C), temperature source Oral, resp. rate 20, height '5\' 5"'$  (1.651 m), weight 40.506 kg (89 lb 4.8 oz), SpO2 99 %.  PHYSICAL EXAMINATION:  GENERAL:  69 y.o.-year-old patient lying in the bed with no acute distress. Severe malnourished. Thin. EYES: Pupils equal, round, reactive to light and accommodation. No scleral icterus. Extraocular muscles intact.  HEENT: Head atraumatic, normocephalic. Oropharynx and nasopharynx clear.  NECK:  Supple, no jugular venous distention. No thyroid enlargement, no tenderness.  LUNGS:  breath sounds bilaterally, no wheezing, positive for  crepitation. Decreased air entry b/l lower lobes No use of accessory muscles of respiration.  CARDIOVASCULAR: S1, S2 normal. No murmurs, rubs, or gallops.  ABDOMEN: Soft, nontender, nondistended. Bowel sounds present. No organomegaly or mass.  EXTREMITIES: No pedal edema, cyanosis, or clubbing.  NEUROLOGIC: Cranial nerves II  through XII are intact. Muscle strength 5/5 in all extremities. Sensation intact. Gait not checked.  PSYCHIATRIC: The patient is alert and oriented x 3.  SKIN: No obvious rash, lesion, or ulcer.   Physical Exam LABORATORY PANEL:   CBC  Recent Labs Lab 06/22/14 1900  WBC 20.0*  HGB 14.2  HCT 43.7  PLT 306   ------------------------------------------------------------------------------------------------------------------  Chemistries   Recent Labs Lab 06/22/14 1900  NA 133*  K 3.9  CL 93*  CO2 30  GLUCOSE 141*  BUN 13  CREATININE 0.64  CALCIUM 8.9  AST 16  ALT 11*  ALKPHOS 82  BILITOT 0.6   ------------------------------------------------------------------------------------------------------------------  Cardiac Enzymes  Recent Labs Lab 06/22/14 1900  TROPONINI <0.03   ------------------------------------------------------------------------------------------------------------------  RADIOLOGY:  Ct Angio Chest Pe W/cm &/or Wo Cm  06/22/2014   CLINICAL DATA:  Left-sided pain for 3 days. Smoker for 57 years. Lung cancer in 1980. Lobectomy. Shortness of breath.  EXAM: CT ANGIOGRAPHY CHEST WITH CONTRAST  TECHNIQUE: Multidetector CT imaging of the chest was performed using the standard protocol during bolus administration of intravenous contrast. Multiplanar CT image reconstructions and MIPs were obtained to evaluate the vascular anatomy.  CONTRAST:  59m OMNIPAQUE IOHEXOL 350 MG/ML SOLN  COMPARISON:  01/25/2014  FINDINGS: Mediastinum/Nodes: The quality of this exam for evaluation of pulmonary embolism is good. No evidence of pulmonary embolism.  Advanced atherosclerosis, including at the origin of the left subclavian artery. Normal heart size, without pericardial effusion.  A subcarinal node is increased at 1.0 cm today versus 8 mm on the prior. Not pathologic by size criteria.  Left infrahilar soft tissue fullness is new or increased. Example at 11 mm on image 96 of series  6.  Lungs/Pleura: Trace right pleural thickening with pleural calcifications. A left-sided pleural effusion is small. Demonstrates loculation, including laterally and medially on image 111.  Fluid within the endobronchial tree, worse on the left, including to the left lower lobe. Advanced centrilobular emphysema with bullous disease at the apices and pleural-parenchymal biapical scarring.  New patchy right lower lobe airspace disease. Left apical opacity is relatively linear at maximally 6 mm on image 34. Slightly increased since the prior exam.  Favor left-sided pleural thickening anteriorly on image 44.  Peripheral left lower lobe airspace disease. Areas of relative hypoattenuation within, including on image 133 of series 6.  Upper abdomen: Well-circumscribed tiny low-density liver lesions are likely cysts or bile duct hamartomas. Normal imaged portions of the spleen, pancreas, left kidney. Left adrenal gland mildly thickened. Right adrenal gland not imaged.  Musculoskeletal: No acute osseous abnormality.  Review of the MIP images confirms the above findings.  IMPRESSION: 1. No evidence of pulmonary embolism. 2. Left worse than right bibasilar airspace disease is new and suspicious for infection or aspiration. Areas of left lower lobe hypo enhancement for which necrosis cannot be excluded. 3. Left-sided pleural effusion with loculation. Likely parapneumonic. No significant pleural enhancement to confirm empyema. Thoracentesis should be considered. 4. Advanced centrilobular emphysema. Left apical linear opacity is slightly increased in thickness since the prior exam. Possibly scarring but warrants followup attention. 5. Left infrahilar soft tissue fullness is favored to be related to reactive adenopathy. 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 Recommend follow-up with chest CT at  approximately 6-12 weeks with special attention to this area. 6. Fluid in the endobronchial tree, especially the left lower lobe. Clinically correlate for aspiration. 7. Right pleural thickening and calcification, likely related to prior empyema or hemothorax. These results will be called to the ordering clinician or representative by the Radiology Department at the imaging location.   Electronically Signed   By: Abigail Miyamoto M.D.   On: 06/22/2014 17:17     ASSESSMENT AND PLAN:  This is a 69 year old Caucasian male admitted for pneumonia with parapneumonic effusion. 1. Pneumonia: Community-acquired, multilobar. Unclear if postobstructive but there is a concern for malignancy as there is left-sided hilar soft tissue fullness. The left-sided pleural effusion is loculated areas of hypoattenuation which could represent necrosis. Thus I started the patient on vancomycin in addition to the Levaquin that he was given in the ED. Pulmonology appreciated- he called CT surgery for thoracentesis & Test pleural fluid. The patient has a history of lung cancer 2. Tobacco abuse: NicoDerm patch 3. Failure to thrive: The patient is moderately malnourished; admits to approximately 15 pounds of unintentional weight loss or 6 months. He denies nausea or vomiting. He may be hypermetabolic due to malignancy. 4. DVT prophylaxis: Heparin 5. GI prophylaxis: None  All the records are reviewed and case discussed with Care Management/Social Workerr. Management plans discussed with the patient, family and they are in agreement.  CODE STATUS: full  TOTAL TIME TAKING CARE OF THIS PATIENT: 35 minutes.   POSSIBLE D/C IN 3-4 DAYS, DEPENDING ON CLINICAL CONDITION.   Vaughan Basta M.D on 06/23/2014   Between 7am to 6pm - Pager - [phone]  After 6pm go to [email or web address] - password [email or web address] [facility]  Office  [phone]  CC: Primary care physician; [name], MD

## 2014-06-23 NOTE — Progress Notes (Signed)
Initial Nutrition Assessment  DOCUMENTATION CODES:  Severe malnutrition in context of chronic illness  INTERVENTION:   (Medical Nutrition Supplement:) Once diet progressed recommend adding Ensure Enlive po BID, each supplement provides 350 kcal and 20 grams of protein   NUTRITION DIAGNOSIS:  Predicted suboptimal nutrient intake related to catabolic illness as evidenced by percent weight loss.    GOAL:  Patient will meet greater than or equal to 90% of their needs    MONITOR:   (Energy intake, Pulmonary profile, )  REASON FOR ASSESSMENT:   (BMI 14.9)    ASSESSMENT:  Pt admitted with multilobe pneumonia, left sided pleural effusion  Past Medical History  Diagnosis Date  . Lung cancer   . Pneumonia Nov '15   Current Nutrition: NPO  Nutrition Prior to Admission Pt reports good, normal intake prior to admission. Typically eats biscuit in am for breakfast, hamburger (fast food) for lunch and grand-daughter usually cooks meat and vegetables for dinner.  Did report since November with bout of pneumonia intake has been up and down.    Labs:  Electrolyte and Renal Profile:  Recent Labs Lab 06/22/14 1900  BUN 13  CREATININE 0.64  NA 133*  K 3.9   Glucose profile: 141   Medications: NS at 15m/hr   Nutrition-Focused physical exam completed. Findings are moderate fat depletion in orbital and thoracic region, severe upper arm fat depletion, severe in all areas except moderate in dorsal hand area, and no edema.    Height:  Ht Readings from Last 1 Encounters:  06/22/14 '5\' 5"'$  (1.651 m)    Weight:  Wt Readings from Last 1 Encounters:  06/23/14 89 lb 4.8 oz (40.506 kg)    Weight Change: 14% weight loss in last 6 months  Wt Readings from Last 10 Encounters:  06/23/14 89 lb 4.8 oz (40.506 kg)    BMI:  Body mass index is 14.86 kg/(m^2).  Estimated Nutritional Needs:  Kcal:  BEE 1306 kcals (IF 1.0-1.2, AF 1.3) 18280-0349kcals/d (Using IBW of  62kg)  Protein:  1.0-1.2 g/d 62-74 g/d (Using IBW of 62kg)  Fluid:  (25-391mkg) 1550-186024m Using IBW of 62kg)  Skin:  Reviewed, no issues  Diet Order:  Diet NPO time specified Except for: Sips with Meds  EDUCATION NEEDS:  No education needs identified at this time   Intake/Output Summary (Last 24 hours) at 06/23/14 0852 Last data filed at 06/23/14 0612  Gross per 24 hour  Intake 618.75 ml  Output    250 ml  Net 368.75 ml    Last BM:  6/9  HIGH Care Level Aniston Christman B. AllZenia ResidesD,KathleenDNHillsboroughager)

## 2014-06-23 NOTE — Plan of Care (Signed)
Problem: Discharge Progression Outcomes Goal: Discharge plan in place and appropriate Individualization of Care Outcome: Progressing 1. Likes to be called Bobbie 2. Lives at home with granddaughter and great grandson 3. Hx of COPD and right upper lung cancer with lobectomy Goal: Other Discharge Outcomes/Goals Outcome: Progressing Plan of care progress to goal for: Pain-pt c/o pain, prn meds given with improvement Hemodynamically-VSS, IVF continue @ 81m/hr, continues on O2 '@4L'$  via nasal cannula Complications-no evidence of this shift Diet-pt is NPO except meds at this time Activity-pt has good mobility

## 2014-06-23 NOTE — Progress Notes (Signed)
ANTIBIOTIC CONSULT NOTE - INITIAL  Pharmacy Consult for Vancomycin/Levaquin Indication: pneumonia  No Known Allergies  Patient Measurements: Height: '5\' 5"'$  (165.1 cm) Weight: 89 lb 3.2 oz (40.461 kg) IBW/kg (Calculated) : 61.5   Vital Signs: Temp: 98.7 F (37.1 C) (06/10 2307) Temp Source: Oral (06/10 2307) BP: 110/51 mmHg (06/11 0008) Pulse Rate: 96 (06/11 0008) Intake/Output from previous day:   Intake/Output from this shift:    Labs:  Recent Labs  06/22/14 1900  WBC 20.0*  HGB 14.2  PLT 306  CREATININE 0.64   Estimated Creatinine Clearance: 49.2 mL/min (by C-G formula based on Cr of 0.64). No results for input(s): VANCOTROUGH, VANCOPEAK, VANCORANDOM, GENTTROUGH, GENTPEAK, GENTRANDOM, TOBRATROUGH, TOBRAPEAK, TOBRARND, AMIKACINPEAK, AMIKACINTROU, AMIKACIN in the last 72 hours.   Kinetics:   Ke: 0.045, Vd: 27.9   Microbiology: No results found for this or any previous visit (from the past 720 hour(s)).  Medical History: History reviewed. No pertinent past medical history.  Medications:  Scheduled:  . budesonide-formoterol  2 puff Inhalation BID  . heparin  5,000 Units Subcutaneous 3 times per day  . [START ON 06/24/2014] levofloxacin (LEVAQUIN) IV  750 mg Intravenous Q48H  . tiotropium  18 mcg Inhalation Daily  . vancomycin  500 mg Intravenous STAT  . vancomycin  500 mg Intravenous Q18H   Infusions:  . sodium chloride 75 mL/hr at 06/22/14 2317   PRN: acetaminophen **OR** acetaminophen, albuterol, ibuprofen, morphine injection, ondansetron **OR** ondansetron (ZOFRAN) IV Anti-infectives    Start     Dose/Rate Route Frequency Ordered Stop   06/24/14 1800  levofloxacin (LEVAQUIN) IVPB 750 mg     750 mg 100 mL/hr over 90 Minutes Intravenous Every 48 hours 06/23/14 0023     06/23/14 0600  vancomycin (VANCOCIN) 500 mg in sodium chloride 0.9 % 100 mL IVPB     500 mg 100 mL/hr over 60 Minutes Intravenous Every 18 hours 06/22/14 2326     06/22/14 2300   vancomycin (VANCOCIN) IVPB 1000 mg/200 mL premix  Status:  Discontinued     1,000 mg 200 mL/hr over 60 Minutes Intravenous  Once 06/22/14 2259 06/22/14 2309   06/22/14 2245  vancomycin (VANCOCIN) 500 mg in sodium chloride 0.9 % 100 mL IVPB     500 mg 100 mL/hr over 60 Minutes Intravenous STAT 06/22/14 2231 06/23/14 2245   06/22/14 2045  levofloxacin (LEVAQUIN) IVPB 750 mg     750 mg 100 mL/hr over 90 Minutes Intravenous  Once 06/22/14 2032 06/22/14 2246     Assessment: Patient with CAP and empyema ordered Levaquin and vancomycin.  Goal of Therapy:  Vancomycin trough level 15-20 mcg/ml  Plan:  1. Vancomycin 500 mg iv q 18 h with stacked dosing. Will check a trough with the 4th dose.   2. Levaquin 750 mg iv q 48 h.    Ulice Dash D 06/23/2014,12:24 AM

## 2014-06-23 NOTE — Plan of Care (Signed)
Problem: Discharge Progression Outcomes Goal: Discharge plan in place and appropriate Individualization of Care  1. Likes to be called Rick Clark 2. Lives at home with granddaughter and great grandson 3. Hx of COPD and right upper lung cancer with lobectomy Goal: Other Discharge Outcomes/Goals Outcome: Progressing Plan of care progress to goal for:  Pain-pt c/o pain, prn morphine given. Pt stated relief. Hemodynamically-VSS, IVF continue @ 84m/hr, continues on O2 '@4L'$  via nasal cannula Complications-no evidence of this shift Diet-changed to regular. Activity-pt has good mobility Plan for possible surgery next week, per Dr. KHumphrey Rolls Cardiothoracic surgeon consult ordered.

## 2014-06-23 NOTE — Consult Note (Signed)
Pulmonary Critical Care  Initial Consult Note   Rick Clark IEP:[id number] DOB: [birth year] DOA: 06/22/2014  Referring physician: Rosilyn Mings, MD PCP: [name], MD   Chief Complaint: SOB Chest Pain  HPI: Rick Clark is a 69 y.o. male with prior history of lung cancer in 1980 presented to the office with increased cough and shortness of breath. He states he was doing fine until Wednsday of this week. He states that he noted increased cough and congestion but he states he was also feeling some pressure on his rib cage. He has been having increased difficulty breathing also. Patient states that he has had no fevers noted no chills noted. He states he was at the beach over the last weekend. Patient still smokes. He states he has had no hemoptysis. He states that the pain is mainly with breathing.   Review of Systems:  Constitutional:  No weight loss, night sweats, Fevers.  HEENT:  No headaches, nasal congestion, post nasal drip,  Cardio-vascular:  +chest pain, PND, swelling in lower extremities  GI:  No heartburn, indigestion, abdominal pain, nausea, vomiting, diarrhea  Resp:  +shortness of breath with exertion. +productive cough, No coughing up of blood Skin:  no rash or lesions.  Musculoskeletal:  No joint pain or swelling.   Remainder ROS performed and is unremarkable other than noted in HPI  Past Medical History  Diagnosis Date  . Lung cancer   . Pneumonia Nov '15   Past Surgical History  Procedure Laterality Date  . Lung lobectomy Right     Upper lobe   Social History:  reports that he has been smoking Cigarettes.  He has a 27.5 pack-year smoking history. He does not have any smokeless tobacco history on file. He reports that he does not drink alcohol or use illicit drugs.  No Known Allergies  Family History  Problem Relation Age of Onset  . Coronary artery disease Father     Prior to Admission medications   Medication Sig Start Date End Date  Taking? Authorizing Provider  albuterol (PROVENTIL HFA;VENTOLIN HFA) 108 (90 BASE) MCG/ACT inhaler Inhale 1 puff into the lungs every 6 (six) hours as needed for wheezing or shortness of breath.   Yes Historical Provider, MD  budesonide-formoterol (SYMBICORT) 160-4.5 MCG/ACT inhaler Inhale 2 puffs into the lungs 2 (two) times daily.   Yes Historical Provider, MD  ibuprofen (ADVIL,MOTRIN) 200 MG tablet Take 400 mg by mouth every 6 (six) hours as needed for headache or mild pain.   Yes Historical Provider, MD  tiotropium (SPIRIVA) 18 MCG inhalation capsule Place 18 mcg into inhaler and inhale daily.   Yes Historical Provider, MD   Physical Exam: Filed Vitals:   06/23/14 0517 06/23/14 0518 06/23/14 0538 06/23/14 1259  BP: 101/49 105/49 118/60 110/56  Pulse: 94 93 93 91  Temp: 99.3 F (37.4 C)   98.4 F (36.9 C)  TempSrc: Oral   Oral  Resp: 20     Height:      Weight:   40.506 kg (89 lb 4.8 oz)   SpO2: 98%   99%    Wt Readings from Last 3 Encounters:  06/23/14 40.506 kg (89 lb 4.8 oz)    General:  Appears calm and comfortable Eyes: PERRL, normal lids, irises & conjunctiva ENT: grossly normal hearing, lips & tongue Neck: no LAD, masses or thyromegaly Cardiovascular: RRR, no m/r/g. No LE edema. Respiratory: Normal respiratory effort. +ronchi noted with increased upper airway secretions Abdomen: soft, nontender Skin:  no rash or induration seen on limited exam Musculoskeletal: grossly normal tone BUE/BLE Psychiatric: grossly normal mood and affect Neurologic: grossly non-focal.          Labs on Admission:  Basic Metabolic Panel:  Recent Labs Lab 06/22/14 1900  NA 133*  K 3.9  CL 93*  CO2 30  GLUCOSE 141*  BUN 13  CREATININE 0.64  CALCIUM 8.9   Liver Function Tests:  Recent Labs Lab 06/22/14 1900  AST 16  ALT 11*  ALKPHOS 82  BILITOT 0.6  PROT 7.8  ALBUMIN 3.4*   No results for input(s): LIPASE, AMYLASE in the last 168 hours. No results for input(s): AMMONIA  in the last 168 hours. CBC:  Recent Labs Lab 06/22/14 1900  WBC 20.0*  NEUTROABS 17.7*  HGB 14.2  HCT 43.7  MCV 83.5  PLT 306   Cardiac Enzymes:  Recent Labs Lab 06/22/14 1900  TROPONINI <0.03    BNP (last 3 results) No results for input(s): BNP in the last 8760 hours.  ProBNP (last 3 results) No results for input(s): PROBNP in the last 8760 hours.  CBG: No results for input(s): GLUCAP in the last 168 hours.  Radiological Exams on Admission: Ct Angio Chest Pe W/cm &/or Wo Cm  06/22/2014   CLINICAL DATA:  Left-sided pain for 3 days. Smoker for 57 years. Lung cancer in 1980. Lobectomy. Shortness of breath.  EXAM: CT ANGIOGRAPHY CHEST WITH CONTRAST  TECHNIQUE: Multidetector CT imaging of the chest was performed using the standard protocol during bolus administration of intravenous contrast. Multiplanar CT image reconstructions and MIPs were obtained to evaluate the vascular anatomy.  CONTRAST:  12m OMNIPAQUE IOHEXOL 350 MG/ML SOLN  COMPARISON:  01/25/2014  FINDINGS: Mediastinum/Nodes: The quality of this exam for evaluation of pulmonary embolism is good. No evidence of pulmonary embolism.  Advanced atherosclerosis, including at the origin of the left subclavian artery. Normal heart size, without pericardial effusion.  A subcarinal node is increased at 1.0 cm today versus 8 mm on the prior. Not pathologic by size criteria.  Left infrahilar soft tissue fullness is new or increased. Example at 11 mm on image 96 of series 6.  Lungs/Pleura: Trace right pleural thickening with pleural calcifications. A left-sided pleural effusion is small. Demonstrates loculation, including laterally and medially on image 111.  Fluid within the endobronchial tree, worse on the left, including to the left lower lobe. Advanced centrilobular emphysema with bullous disease at the apices and pleural-parenchymal biapical scarring.  New patchy right lower lobe airspace disease. Left apical opacity is relatively  linear at maximally 6 mm on image 34. Slightly increased since the prior exam.  Favor left-sided pleural thickening anteriorly on image 44.  Peripheral left lower lobe airspace disease. Areas of relative hypoattenuation within, including on image 133 of series 6.  Upper abdomen: Well-circumscribed tiny low-density liver lesions are likely cysts or bile duct hamartomas. Normal imaged portions of the spleen, pancreas, left kidney. Left adrenal gland mildly thickened. Right adrenal gland not imaged.  Musculoskeletal: No acute osseous abnormality.  Review of the MIP images confirms the above findings.  IMPRESSION: 1. No evidence of pulmonary embolism. 2. Left worse than right bibasilar airspace disease is new and suspicious for infection or aspiration. Areas of left lower lobe hypo enhancement for which necrosis cannot be excluded. 3. Left-sided pleural effusion with loculation. Likely parapneumonic. No significant pleural enhancement to confirm empyema. Thoracentesis should be considered. 4. Advanced centrilobular emphysema. Left apical linear opacity is slightly increased in thickness  since the prior exam. Possibly scarring but warrants followup attention. 5. Left infrahilar soft tissue fullness is favored to be related to reactive adenopathy. 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 Recommend follow-up with chest CT at approximately 6-12 weeks with special attention to this area. 6. Fluid in the endobronchial tree, especially the left lower lobe. Clinically correlate for aspiration. 7. Right pleural thickening and calcification, likely related to prior empyema or hemothorax. These results will be called to the ordering clinician or representative by the Radiology Department at the imaging location.   Electronically Signed   By: Abigail Miyamoto M.D.   On: 06/22/2014 17:17    EKG:  Independently reviewed.  Assessment/Plan Principal Problem:   Pneumonia Active Problems:   Parapneumonic effusion   Failure to thrive in adult   1. Probable Empyema -I have personally reviewed that CT scan -will get a Thoracic surgery consult I think he needs a chest tube drainage or he will need VATS -agree with antibiotics  2. COPD -discussed smoking cessation -MDI as needed  3. H/o Lung Cancer -worrisome as he has ongoing tobacco use -will need to send fluid for cytology  Code Status: Full Code DVT Prophylaxis:SCD Family Communication: Ex-Wife Disposition Plan: Home  Time spent: 45mn    I have personally obtained a history, examined the patient, evaluated laboratory and imaging results, formulated the assessment and plan and placed orders.  The Patient requires high complexity decision making for assessment and support.    SAllyne Gee MD [facility] Sleep Medicine

## 2014-06-24 DIAGNOSIS — E43 Unspecified severe protein-calorie malnutrition: Secondary | ICD-10-CM | POA: Insufficient documentation

## 2014-06-24 LAB — CREATININE, SERUM
Creatinine, Ser: 0.66 mg/dL (ref 0.61–1.24)
GFR calc Af Amer: >60 mL/min (ref >60)
GFR calc non Af Amer: >60 mL/min (ref >60)

## 2014-06-24 MED ORDER — OXYCODONE-ACETAMINOPHEN 5-325 MG PO TABS
1.0000 | ORAL_TABLET | Freq: Once | ORAL | Status: AC
  Administered 2014-06-24: 02:00:00 1 via ORAL
  Filled 2014-06-24: qty 1

## 2014-06-24 MED ORDER — OXYCODONE-ACETAMINOPHEN 5-325 MG PO TABS
1.0000 | ORAL_TABLET | ORAL | Status: DC | PRN
  Administered 2014-06-26: 1 via ORAL
  Filled 2014-06-24: qty 1

## 2014-06-24 MED ORDER — ALBUTEROL SULFATE (2.5 MG/3ML) 0.083% IN NEBU
2.5000 mg | INHALATION_SOLUTION | RESPIRATORY_TRACT | Status: DC
Start: 2014-06-24 — End: 2014-06-26
  Administered 2014-06-24 – 2014-06-26 (×11): 2.5 mg via RESPIRATORY_TRACT
  Filled 2014-06-24 (×12): qty 3

## 2014-06-24 MED ORDER — SODIUM CHLORIDE 0.9 % IJ SOLN
3.0000 mL | INTRAMUSCULAR | Status: DC | PRN
  Administered 2014-06-25 – 2014-07-01 (×2): 3 mL via INTRAVENOUS
  Filled 2014-06-24 (×2): qty 10

## 2014-06-24 MED ORDER — SODIUM CHLORIDE 0.9 % IJ SOLN
3.0000 mL | Freq: Three times a day (TID) | INTRAMUSCULAR | Status: DC
  Administered 2014-06-24 – 2014-07-02 (×23): 3 mL via INTRAVENOUS

## 2014-06-24 MED ORDER — ENSURE ENLIVE PO LIQD
237.0000 mL | Freq: Two times a day (BID) | ORAL | Status: DC
Start: 2014-06-24 — End: 2014-07-02
  Administered 2014-06-24 – 2014-07-02 (×17): 237 mL via ORAL

## 2014-06-24 MED ORDER — VANCOMYCIN HCL IN DEXTROSE 750-5 MG/150ML-% IV SOLN
750.0000 mg | INTRAVENOUS | Status: DC
  Administered 2014-06-24: 750 mg via INTRAVENOUS
  Filled 2014-06-24 (×3): qty 150

## 2014-06-24 MED ORDER — LEVOFLOXACIN IN D5W 500 MG/100ML IV SOLN
500.0000 mg | INTRAVENOUS | Status: DC
  Administered 2014-06-24 – 2014-06-26 (×3): 500 mg via INTRAVENOUS
  Filled 2014-06-24 (×5): qty 100

## 2014-06-24 NOTE — Plan of Care (Addendum)
Problem: Discharge Progression Outcomes Goal: Other Discharge Outcomes/Goals Outcome: Progressing Plan of care progress to goal for:  Pain-pt c/o pain when coughing and movement, prn morphine given. Percocet ordered as well. Pt stated relief. Hemodynamically-Continues on O2 '@4L'$  via nasal cannula. MD aware of low BP. No interventions at this time. Complications-no evidence of this shift Diet-tolerating diet. Ensure ordered. Activity-pt has good mobility Cardiothoracic surgeon consult ordered.

## 2014-06-24 NOTE — Progress Notes (Addendum)
Paged MD about pulse ox and low BP. Waiting on callback. MD returned call, nebulizer q4h ordered.

## 2014-06-24 NOTE — Progress Notes (Signed)
ANTIBIOTIC CONSULT NOTE - INITIAL  Pharmacy Consult for Vancomycin/Levaquin Indication: pneumonia  No Known Allergies  Patient Measurements: Height: '5\' 5"'$  (165.1 cm) Weight: 96 lb 8 oz (43.772 kg) IBW/kg (Calculated) : 61.5  Vital Signs: Temp: 98.6 F (37 C) (06/12 0736) Temp Source: Oral (06/12 0736) BP: 125/46 mmHg (06/12 0828) Pulse Rate: 66 (06/12 0828)  Labs:  Recent Labs  06/22/14 1900 06/24/14 0457  WBC 20.0*  --   HGB 14.2  --   PLT 306  --   CREATININE 0.64 0.66   Estimated Creatinine Clearance: 53.2 mL/min (by C-G formula based on Cr of 0.66).  Kinetics:   Ke: 0.045, Vd: 27.9   Microbiology: Recent Results (from the past 720 hour(s))  Blood culture (routine x 2)     Status: None (Preliminary result)   Collection Time: 06/22/14  6:38 PM  Result Value Ref Range Status   Specimen Description BLOOD  Final   Special Requests Normal  Final   Culture NO GROWTH 2 DAYS  Final   Report Status PENDING  Incomplete  Blood culture (routine x 2)     Status: None (Preliminary result)   Collection Time: 06/22/14  6:44 PM  Result Value Ref Range Status   Specimen Description BLOOD  Final   Special Requests Normal  Final   Culture NO GROWTH 2 DAYS  Final   Report Status PENDING  Incomplete    Medical History: Past Medical History  Diagnosis Date  . Lung cancer   . Pneumonia Nov '15    Anti-infectives    Start     Dose/Rate Route Frequency Ordered Stop   06/24/14 1800  levofloxacin (LEVAQUIN) IVPB 750 mg     750 mg 100 mL/hr over 90 Minutes Intravenous Every 48 hours 06/23/14 0023     06/23/14 0600  vancomycin (VANCOCIN) 500 mg in sodium chloride 0.9 % 100 mL IVPB     500 mg 100 mL/hr over 60 Minutes Intravenous Every 18 hours 06/22/14 2326     06/22/14 2300  vancomycin (VANCOCIN) IVPB 1000 mg/200 mL premix  Status:  Discontinued     1,000 mg 200 mL/hr over 60 Minutes Intravenous  Once 06/22/14 2259 06/22/14 2309   06/22/14 2245  vancomycin (VANCOCIN) 500  mg in sodium chloride 0.9 % 100 mL IVPB     500 mg 100 mL/hr over 60 Minutes Intravenous STAT 06/22/14 2231 06/23/14 0039   06/22/14 2045  levofloxacin (LEVAQUIN) IVPB 750 mg     750 mg 100 mL/hr over 90 Minutes Intravenous  Once 06/22/14 2032 06/22/14 2246     Assessment: Patient with CAP and empyema ordered Levaquin and vancomycin.   Goal of Therapy:  Vancomycin trough level 15-20 mcg/ml  Plan:   1. Current orders for levaquin '750mg'$  IV Q48H, will change to levaquin '500mg'$  IV Q24H as indicated for renal function (CrCl > 82m/min) and indication (CAP)  2. Current orders for '500mg'$  IV Q18H. With updated weight and improved renal function, will increase to '750mg'$  IV Q18H. Will check trough 6/13, this will not be at steady state.  Pharmacy to follow per consult  JRexene Edison PharmD Clinical Pharmacist  06/24/2014,10:27 AM

## 2014-06-24 NOTE — Progress Notes (Signed)
[facility] NAME: Rick Clark    MR#:  [id number]  DATE OF BIRTH:  [birth year]  SUBJECTIVE:  CHIEF COMPLAINT:   Chief Complaint  Patient presents with  . Pneumonia   Have c/o pain in left side chest after coughing.  REVIEW OF SYSTEMS:  CONSTITUTIONAL: No fever,positive fatigue and weakness. Loosing weight. EYES: No blurred or double vision.  EARS, NOSE, AND THROAT: No tinnitus or ear pain.  RESPIRATORY: positive cough, shortness of breath, no wheezing or hemoptysis.  CARDIOVASCULAR: positive chest pain with cough , no orthopnea, edema.  GASTROINTESTINAL: No nausea, vomiting, diarrhea or abdominal pain.  GENITOURINARY: No dysuria, hematuria.  ENDOCRINE: No polyuria, nocturia,  HEMATOLOGY: No anemia, easy bruising or bleeding SKIN: No rash or lesion. MUSCULOSKELETAL: No joint pain or arthritis.   NEUROLOGIC: No tingling, numbness, weakness.  PSYCHIATRY: No anxiety or depression.   ROS  DRUG ALLERGIES:  No Known Allergies  VITALS:  Blood pressure 125/46, pulse 66, temperature 98.6 F (37 C), temperature source Oral, resp. rate 18, height '5\' 5"'$  (1.651 m), weight 43.772 kg (96 lb 8 oz), SpO2 95 %.  PHYSICAL EXAMINATION:  GENERAL:  69 y.o.-year-old patient lying in the bed with no acute distress. Severe malnourished. Thin. EYES: Pupils equal, round, reactive to light and accommodation. No scleral icterus. Extraocular muscles intact.  HEENT: Head atraumatic, normocephalic. Oropharynx and nasopharynx clear.  NECK:  Supple, no jugular venous distention. No thyroid enlargement, no tenderness.  LUNGS:  breath sounds bilaterally, no wheezing, positive for  crepitation. Decreased air entry b/l lower lobes No use of accessory muscles of respiration.  CARDIOVASCULAR: S1, S2 normal. No murmurs, rubs, or gallops.  ABDOMEN: Soft, nontender, nondistended. Bowel sounds present. No organomegaly or mass.  EXTREMITIES: No pedal  edema, cyanosis, or clubbing.  NEUROLOGIC: Cranial nerves II through XII are intact. Muscle strength 5/5 in all extremities. Sensation intact. Gait not checked.  PSYCHIATRIC: The patient is alert and oriented x 3.  SKIN: No obvious rash, lesion, or ulcer.   Physical Exam LABORATORY PANEL:   CBC  Recent Labs Lab 06/22/14 1900  WBC 20.0*  HGB 14.2  HCT 43.7  PLT 306   ------------------------------------------------------------------------------------------------------------------  Chemistries   Recent Labs Lab 06/22/14 1900 06/24/14 0457  NA 133*  --   K 3.9  --   CL 93*  --   CO2 30  --   GLUCOSE 141*  --   BUN 13  --   CREATININE 0.64 0.66  CALCIUM 8.9  --   AST 16  --   ALT 11*  --   ALKPHOS 82  --   BILITOT 0.6  --    ------------------------------------------------------------------------------------------------------------------  Cardiac Enzymes  Recent Labs Lab 06/22/14 1900  TROPONINI <0.03   ------------------------------------------------------------------------------------------------------------------  RADIOLOGY:  Ct Angio Chest Pe W/cm &/or Wo Cm  06/22/2014   CLINICAL DATA:  Left-sided pain for 3 days. Smoker for 57 years. Lung cancer in 1980. Lobectomy. Shortness of breath.  EXAM: CT ANGIOGRAPHY CHEST WITH CONTRAST  TECHNIQUE: Multidetector CT imaging of the chest was performed using the standard protocol during bolus administration of intravenous contrast. Multiplanar CT image reconstructions and MIPs were obtained to evaluate the vascular anatomy.  CONTRAST:  81m OMNIPAQUE IOHEXOL 350 MG/ML SOLN  COMPARISON:  01/25/2014  FINDINGS: Mediastinum/Nodes: The quality of this exam for evaluation of pulmonary embolism is good. No evidence of pulmonary embolism.  Advanced atherosclerosis, including at the origin of the left subclavian artery.  Normal heart size, without pericardial effusion.  A subcarinal node is increased at 1.0 cm today versus 8 mm on the  prior. Not pathologic by size criteria.  Left infrahilar soft tissue fullness is new or increased. Example at 11 mm on image 96 of series 6.  Lungs/Pleura: Trace right pleural thickening with pleural calcifications. A left-sided pleural effusion is small. Demonstrates loculation, including laterally and medially on image 111.  Fluid within the endobronchial tree, worse on the left, including to the left lower lobe. Advanced centrilobular emphysema with bullous disease at the apices and pleural-parenchymal biapical scarring.  New patchy right lower lobe airspace disease. Left apical opacity is relatively linear at maximally 6 mm on image 34. Slightly increased since the prior exam.  Favor left-sided pleural thickening anteriorly on image 44.  Peripheral left lower lobe airspace disease. Areas of relative hypoattenuation within, including on image 133 of series 6.  Upper abdomen: Well-circumscribed tiny low-density liver lesions are likely cysts or bile duct hamartomas. Normal imaged portions of the spleen, pancreas, left kidney. Left adrenal gland mildly thickened. Right adrenal gland not imaged.  Musculoskeletal: No acute osseous abnormality.  Review of the MIP images confirms the above findings.  IMPRESSION: 1. No evidence of pulmonary embolism. 2. Left worse than right bibasilar airspace disease is new and suspicious for infection or aspiration. Areas of left lower lobe hypo enhancement for which necrosis cannot be excluded. 3. Left-sided pleural effusion with loculation. Likely parapneumonic. No significant pleural enhancement to confirm empyema. Thoracentesis should be considered. 4. Advanced centrilobular emphysema. Left apical linear opacity is slightly increased in thickness since the prior exam. Possibly scarring but warrants followup attention. 5. Left infrahilar soft tissue fullness is favored to be related to reactive adenopathy. A central left lower lobe primary bronchogenic carcinoma (with a component  of the left lower lobe airspace disease representing postobstructive pneumonitis) could look similar. Recommend follow-up with chest CT at approximately 6-12 weeks with special attention to this area. 6. Fluid in the endobronchial tree, especially the left lower lobe. Clinically correlate for aspiration. 7. Right pleural thickening and calcification, likely related to prior empyema or hemothorax. These results will be called to the ordering clinician or representative by the Radiology Department at the imaging location.   Electronically Signed   By: Abigail Miyamoto M.D.   On: 06/22/2014 17:17     ASSESSMENT AND PLAN:  This is a 69 year old Caucasian male admitted for pneumonia with parapneumonic effusion. 1. Pneumonia: Community-acquired, multilobar. Unclear if postobstructive but there is a concern for malignancy as there is left-sided hilar soft tissue fullness. The left-sided pleural effusion is loculated   on vancomycin &  Levaquin   Consult Pulmonology appreciated- he called CT surgery for thoracentesis & Test pleural fluid.   The patient has a history of lung cancer  2. Tobacco abuse: NicoDerm patch, councelled to quit for 4 min. 3. Failure to thrive: The patient is moderately malnourished; admits to approximately 15 pounds of unintentional weight loss or 6 months. He denies nausea or vomiting. He may be hypermetabolic due to malignancy. 4. DVT prophylaxis: Heparin 5. GI prophylaxis: None 6. Severe malnutrition- will give supplements and check for underlying malignancy.  All the records are reviewed and case discussed with Care Management/Social Workerr. Management plans discussed with the patient, family and they are in agreement.  CODE STATUS: full  TOTAL TIME TAKING CARE OF THIS PATIENT: 35 minutes.   POSSIBLE D/C IN 3-4 DAYS, DEPENDING ON CLINICAL CONDITION.   Baldemar Dady, Rosalio Macadamia  M.D on 06/24/2014   Between 7am to 6pm - Pager - [phone]  After 6pm go to [email or web address] -  password [email or web address] [facility]  Office  [phone]  CC: Primary care physician; [name], MD

## 2014-06-24 NOTE — Plan of Care (Signed)
Problem: Discharge Progression Outcomes Goal: Discharge plan in place and appropriate Individualization of Care  1. Likes to be called Bobbie 2. Lives at home with granddaughter and great grandson 3. Hx of COPD and right upper lung cancer with lobectomy Goal: Other Discharge Outcomes/Goals Plan of care progress to goal for: pneumonia - Continues on ABX. - Complained of pain, morphine given with improvement. - Uses urinal at bedisde. - Fluids d/c per order. Will continue to monitor.

## 2014-06-25 ENCOUNTER — Inpatient Hospital Stay: Payer: PPO

## 2014-06-25 DIAGNOSIS — J90 Pleural effusion, not elsewhere classified: Secondary | ICD-10-CM

## 2014-06-25 LAB — BODY FLUID CELL COUNT WITH DIFFERENTIAL
EOS FL: 0 %
Lymphs, Fluid: 4 %
Monocyte-Macrophage-Serous Fluid: 16 % — ABNORMAL LOW (ref 50–90)
Neutrophil Count, Fluid: 80 % — ABNORMAL HIGH (ref 0–25)
Other Cells, Fluid: 0 %
WBC FLUID: 420 uL (ref 0–1000)

## 2014-06-25 LAB — PROTIME-INR
INR: 1.11
Prothrombin Time: 14.5 seconds (ref 11.4–15.0)

## 2014-06-25 LAB — VANCOMYCIN, TROUGH: Vancomycin Tr: <4 ug/mL — ABNORMAL LOW (ref 10–20)

## 2014-06-25 MED ORDER — VANCOMYCIN HCL IN DEXTROSE 750-5 MG/150ML-% IV SOLN
750.0000 mg | Freq: Two times a day (BID) | INTRAVENOUS | Status: DC
  Administered 2014-06-25 – 2014-06-26 (×3): 750 mg via INTRAVENOUS
  Filled 2014-06-25 (×4): qty 150

## 2014-06-25 MED ORDER — SENNA 8.6 MG PO TABS
1.0000 | ORAL_TABLET | Freq: Every day | ORAL | Status: DC | PRN
  Administered 2014-06-27 – 2014-06-28 (×2): 8.6 mg via ORAL
  Filled 2014-06-25 (×2): qty 1

## 2014-06-25 NOTE — Procedures (Signed)
Left chest tube placement    Complications:  None  Blood Loss: none  See dictation in canopy pacs

## 2014-06-25 NOTE — Plan of Care (Signed)
Problem: Discharge Progression Outcomes Goal: Other Discharge Outcomes/Goals Outcome: Progressing Progress toward goals: Pt has required pain meds for right sided pain. Pain meds did help decrease pain. Pt has been hemodynamically stable. Right pigtail chest tube placed today by [place]. Chest tube to 20 cm suction, creamy serous drainage  Total of 323m since insertion. Increasing amount of oral intake as day progressed. Pt's famly at bedside most of day.

## 2014-06-25 NOTE — Progress Notes (Signed)
[facility] NAME: Rick Clark    MR#:  [id number]  DATE OF BIRTH:  1945/09/17  SUBJECTIVE:  CHIEF COMPLAINT:   Chief Complaint  Patient presents with  . Pneumonia   Have c/o pain in left side chest after coughing. Requires oxygen. tolerating diet.  REVIEW OF SYSTEMS:  CONSTITUTIONAL: No fever,positive fatigue and weakness. Loosing weight. EYES: No blurred or double vision.  EARS, NOSE, AND THROAT: No tinnitus or ear pain.  RESPIRATORY: positive cough, shortness of breath, no wheezing or hemoptysis.  CARDIOVASCULAR: positive chest pain with cough , no orthopnea, edema.  GASTROINTESTINAL: No nausea, vomiting, diarrhea or abdominal pain.  GENITOURINARY: No dysuria, hematuria.  ENDOCRINE: No polyuria, nocturia,  HEMATOLOGY: No anemia, easy bruising or bleeding SKIN: No rash or lesion. MUSCULOSKELETAL: No joint pain or arthritis.   NEUROLOGIC: No tingling, numbness, weakness.  PSYCHIATRY: No anxiety or depression.   ROS  DRUG ALLERGIES:  No Known Allergies  VITALS:  Blood pressure 102/41, pulse 92, temperature 98.9 F (37.2 C), temperature source Oral, resp. rate 28, height '5\' 5"'$  (1.651 m), weight 44.407 kg (97 lb 14.4 oz), SpO2 93 %.  PHYSICAL EXAMINATION:  GENERAL:  69 y.o.-year-old patient lying in the bed with no acute distress. Severe malnourished. Thin. EYES: Pupils equal, round, reactive to light and accommodation. No scleral icterus. Extraocular muscles intact.  HEENT: Head atraumatic, normocephalic. Oropharynx and nasopharynx clear.  NECK:  Supple, no jugular venous distention. No thyroid enlargement, no tenderness.  LUNGS:  breath sounds bilaterally, no wheezing, positive for  crepitation. Decreased air entry b/l lower lobes No use of accessory muscles of respiration.  CARDIOVASCULAR: S1, S2 normal. No murmurs, rubs, or gallops.  ABDOMEN: Soft, nontender, nondistended. Bowel sounds present. No  organomegaly or mass.  EXTREMITIES: No pedal edema, cyanosis, or clubbing.  NEUROLOGIC: Cranial nerves II through XII are intact. Muscle strength 5/5 in all extremities. Sensation intact. Gait not checked.  PSYCHIATRIC: The patient is alert and oriented x 3.  SKIN: No obvious rash, lesion, or ulcer.   Physical Exam LABORATORY PANEL:   CBC  Recent Labs Lab 06/22/14 1900  WBC 20.0*  HGB 14.2  HCT 43.7  PLT 306   ------------------------------------------------------------------------------------------------------------------  Chemistries   Recent Labs Lab 06/22/14 1900 06/24/14 0457  NA 133*  --   K 3.9  --   CL 93*  --   CO2 30  --   GLUCOSE 141*  --   BUN 13  --   CREATININE 0.64 0.66  CALCIUM 8.9  --   AST 16  --   ALT 11*  --   ALKPHOS 82  --   BILITOT 0.6  --    ------------------------------------------------------------------------------------------------------------------  Cardiac Enzymes  Recent Labs Lab 06/22/14 1900  TROPONINI <0.03   ------------------------------------------------------------------------------------------------------------------  RADIOLOGY:  Dg Chest 2 View  06/25/2014   Inez Catalina, MD     06/25/2014  2:48 PM Left chest tube placement    Complications:  None  Blood Loss: none  See dictation in canopy pacs    ASSESSMENT AND PLAN:  This is a 69 year old Caucasian male admitted for pneumonia with parapneumonic effusion.  1. Pneumonia: Community-acquired, multilobar. Unclear if postobstructive but there is a concern for  malignancy as there is left-sided hilar soft tissue fullness. The left-sided pleural effusion is loculated   on vancomycin &  Levaquin   Consult Pulmonology appreciated- he called CT surgery for thoracentesis & Test pleural fluid.   The  patient has a history of lung cancer and surgery, so suggested radiological drainage.  sent today for CT guided drainage. 2. Tobacco abuse: NicoDerm patch, councelled to quit for  4 min.  3. Failure to thrive: The patient is moderately malnourished; admits to approximately 15 pounds of unintentional weight loss or 6 months. He denies nausea or vomiting. He may be hypermetabolic due to malignancy. 4. DVT prophylaxis: Heparin 5. GI prophylaxis: None 6. Severe malnutrition- will give supplements and check for underlying malignancy.      Wait for cytology from pleural fluids.  All the records are reviewed and case discussed with Care Management/Social Workerr. Management plans discussed with the patient, family and they are in agreement.  CODE STATUS: full  TOTAL TIME TAKING CARE OF THIS PATIENT: 35 minutes.   POSSIBLE D/C IN 3-4 DAYS, DEPENDING ON CLINICAL CONDITION.   Vaughan Basta M.D on 06/25/2014   Between 7am to 6pm - Pager - [phone]  After 6pm go to [email or web address] - password [email or web address]  Office  [phone]  CC: Primary care physician; [name], MD

## 2014-06-25 NOTE — Sedation Documentation (Signed)
Dr Dominga Ferry called, at Dr Liborio Nixon request to verify that he still wants pt to have Chest xray, no since ct guided pigtail inserted to drain. Pt didn't get any moderate sedation and tolerated procedure well. Report called to Butch Penny, pt returned to room.

## 2014-06-25 NOTE — Progress Notes (Addendum)
ANTIBIOTIC CONSULT NOTE - INITIAL  Pharmacy Consult for Vancomycin/Levaquin Indication: pneumonia  No Known Allergies  Patient Measurements: Height: '5\' 5"'$  (165.1 cm) Weight: 97 lb 14.4 oz (44.407 kg) IBW/kg (Calculated) : 61.5  Vital Signs: Temp: 99.2 F (37.3 C) (06/13 1503) Temp Source: Oral (06/13 1503) BP: 109/51 mmHg (06/13 1503) Pulse Rate: 91 (06/13 1503)  Labs:  Recent Labs  06/22/14 1900 06/24/14 0457  WBC 20.0*  --   HGB 14.2  --   PLT 306  --   CREATININE 0.64 0.66   Estimated Creatinine Clearance: 54 mL/min (by C-G formula based on Cr of 0.66).  Kinetics:   Ke: 0.045, Vd: 27.9   Microbiology: Recent Results (from the past 720 hour(s))  Blood culture (routine x 2)     Status: None (Preliminary result)   Collection Time: 06/22/14  6:38 PM  Result Value Ref Range Status   Specimen Description BLOOD  Final   Special Requests Normal  Final   Culture NO GROWTH 3 DAYS  Final   Report Status PENDING  Incomplete  Blood culture (routine x 2)     Status: None (Preliminary result)   Collection Time: 06/22/14  6:44 PM  Result Value Ref Range Status   Specimen Description BLOOD  Final   Special Requests Normal  Final   Culture NO GROWTH 3 DAYS  Final   Report Status PENDING  Incomplete    Medical History: Past Medical History  Diagnosis Date  . Lung cancer   . Pneumonia Nov '15    Anti-infectives    Start     Dose/Rate Route Frequency Ordered Stop   06/25/14 1600  vancomycin (VANCOCIN) IVPB 750 mg/150 ml premix     750 mg 150 mL/hr over 60 Minutes Intravenous Every 12 hours 06/25/14 1512     06/24/14 1800  levofloxacin (LEVAQUIN) IVPB 750 mg  Status:  Discontinued     750 mg 100 mL/hr over 90 Minutes Intravenous Every 48 hours 06/23/14 0023 06/24/14 1054   06/24/14 1800  vancomycin (VANCOCIN) IVPB 750 mg/150 ml premix  Status:  Discontinued     750 mg 150 mL/hr over 60 Minutes Intravenous Every 18 hours 06/24/14 1054 06/25/14 1512   06/24/14 1200   levofloxacin (LEVAQUIN) IVPB 500 mg     500 mg 100 mL/hr over 60 Minutes Intravenous Every 24 hours 06/24/14 1054     06/23/14 0600  vancomycin (VANCOCIN) 500 mg in sodium chloride 0.9 % 100 mL IVPB  Status:  Discontinued     500 mg 100 mL/hr over 60 Minutes Intravenous Every 18 hours 06/22/14 2326 06/24/14 1054   06/22/14 2300  vancomycin (VANCOCIN) IVPB 1000 mg/200 mL premix  Status:  Discontinued     1,000 mg 200 mL/hr over 60 Minutes Intravenous  Once 06/22/14 2259 06/22/14 2309   06/22/14 2245  vancomycin (VANCOCIN) 500 mg in sodium chloride 0.9 % 100 mL IVPB     500 mg 100 mL/hr over 60 Minutes Intravenous STAT 06/22/14 2231 06/23/14 0039   06/22/14 2045  levofloxacin (LEVAQUIN) IVPB 750 mg     750 mg 100 mL/hr over 90 Minutes Intravenous  Once 06/22/14 2032 06/22/14 2246     Assessment: Patient with CAP and empyema ordered Levaquin and vancomycin.   Vanc Trough of <4 at 1130 today Goal of Therapy:  Vancomycin trough level 15-20 mcg/ml  Plan:   1. Will continue levaquin '500mg'$  IV Q24H as indicated for renal function and indication (CAP)  2. Vanc trough of <  4.  Will increase dosing to Vancomycin 750 mg IV q12h. Will obtain trough prior to 3rd dose (not at steady state) for monitoring purposes.  Will follow SCr closely.    6/14:  Vanc trough @ 15:30 = 8 mcg/mL Will increase dose to Vancomycin 1 gm IV Q8H to start 6/15 @ 00:00. Will draw next trough before 3rd new dose on 6/15 @ 15:30.   Pharmacy to follow per consult  Murrell Converse, PharmD Clinical Pharmacist 06/25/2014

## 2014-06-25 NOTE — Consult Note (Signed)
Patient ID: Rick Clark, male   DOB: [birth year], 69 y.o.   MRN: [id number]  Chief Complaint  Patient presents with  . Pneumonia    Referred By Dr. Nathanial Rancher Reason for Referral left pleural effusion  HPI Location, Quality, Duration, Severity, Timing, Context, Modifying Factors, Associated Signs and Symptoms.  Rick Clark is a 69 y.o. male.  This patient was personally seen by me at the request of Dr. Yancey Flemings. I was asked to see this patient regarding his left pleural effusion and possible empyema.  This patient is a 69 year old gentleman with a long-standing history of tobacco abuse. He smokes at least a pack cigarettes a day. He presented to the emergency room with a 24-48 hour history of left-sided pleuritic type chest pain. He denied any fever chills or sputum production. He states that the pain was most severe with movement and deep inspiration or coughing. A CT scan was performed which revealed a left lower lobe pneumonia with a questionable hypodense area in the left lower lobe which may represent an abscess and a loculated left-sided pleural effusion. The patient was admitted to the hospital where he was placed on intravenous antibiotics. He has a history of a right thoracotomy in the early 1990s. This was performed at [facility] for lung cancer. He did not have any additional therapy. He does have a primary pulmonologist, Dr.Saddat Humphrey Rolls.  He does not use oxygen at home. 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 He did endorse a 25 pound weight loss over the last several months which was unintentional. He states that his appetite has been poor and that he has early satiety. His usual weight is about 1:15 but currently he states he weighs 90 pounds.   Past Medical History  Diagnosis Date  . Lung cancer   . Pneumonia Nov '15    Past Surgical History  Procedure Laterality Date  . Lung lobectomy Right      Upper lobe    Family History  Problem Relation Age of Onset  . Coronary artery disease Father     Social History History  Substance Use Topics  . Smoking status: Current Every Day Smoker -- 0.50 packs/day for 55 years    Types: Cigarettes  . Smokeless tobacco: Not on file  . Alcohol Use: No    No Known Allergies  Current Facility-Administered Medications  Medication Dose Route Frequency Provider Last Rate Last Dose  . acetaminophen (TYLENOL) tablet 650 mg  650 mg Oral Q6H PRN Harrie Foreman, MD       Or  . acetaminophen (TYLENOL) suppository 650 mg  650 mg Rectal Q6H PRN Harrie Foreman, MD      . albuterol (PROVENTIL) (2.5 MG/3ML) 0.083% nebulizer solution 2.5 mg  2.5 mg Nebulization Q4H Vaughan Basta, MD   2.5 mg at 06/25/14 0812  . albuterol (PROVENTIL) (2.5 MG/3ML) 0.083% nebulizer solution 3 mL  3 mL Inhalation Q6H PRN Harrie Foreman, MD      . budesonide-formoterol [facility]) 160-4.5 MCG/ACT inhaler 2 puff  2 puff Inhalation BID Harrie Foreman, MD   2 puff at 06/25/14 0900  . feeding supplement (ENSURE ENLIVE) (ENSURE ENLIVE) liquid 237 mL  237 mL Oral BID BM Vaughan Basta, MD   237 mL at 06/25/14 0902  . ibuprofen (ADVIL,MOTRIN) tablet 400 mg  400 mg Oral Q6H PRN Harrie Foreman, MD      .  levofloxacin (LEVAQUIN) IVPB 500 mg  500 mg Intravenous Q24H Jody C Barefoot, RPH   500 mg at 06/24/14 1150  . morphine 2 MG/ML injection 2 mg  2 mg Intravenous Q3H PRN Harrie Foreman, MD   2 mg at 06/25/14 0858  . nicotine (NICODERM CQ - dosed in mg/24 hours) patch 21 mg  21 mg Transdermal Daily Harrie Foreman, MD   21 mg at 06/23/14 0911  . ondansetron (ZOFRAN) tablet 4 mg  4 mg Oral Q6H PRN Harrie Foreman, MD       Or  . ondansetron [facility]) injection 4 mg  4 mg Intravenous Q6H PRN Harrie Foreman, MD      . oxyCODONE-acetaminophen (PERCOCET/ROXICET) 5-325 MG per tablet 1 tablet  1 tablet Oral Q4H PRN Vaughan Basta, MD      . sodium  chloride 0.9 % injection 3 mL  3 mL Intravenous Q8H Vaughan Basta, MD   3 mL at 06/25/14 0858  . sodium chloride 0.9 % injection 3 mL  3 mL Intravenous PRN Vaughan Basta, MD      . tiotropium (SPIRIVA) inhalation capsule 18 mcg  18 mcg Inhalation Daily Harrie Foreman, MD   18 mcg at 06/25/14 0901  . vancomycin (VANCOCIN) IVPB 750 mg/150 ml premix  750 mg Intravenous Q18H Jody C Barefoot, RPH   750 mg at 06/24/14 1734      Review of Systems A 10 point review of systems was asked and was negative except for the following positive findings 25 pound weight loss, pleuritic chest pain, no fevers, no chills, no sputum production, early satiety.  Blood pressure 106/42, pulse 83, temperature 98.8 F (37.1 C), temperature source Oral, resp. rate 28, height '5\' 5"'$  (1.651 m), weight 44.407 kg (97 lb 14.4 oz), SpO2 94 %.  Physical Exam CONSTITUTIONAL:  Pleasant, very thin and elderly appearing gentleman, and in no acute distress. EYES: Pupils equal and reactive to light, Sclera non-icteric EARS, NOSE, MOUTH AND THROAT:  The oropharynx was clear.  Edentulous.  Oral mucosa pink and moist. LYMPH NODES:  Lymph nodes in the neck and axillae were normal RESPIRATORY:  Lungs were clear but very distant.  Normal respiratory effort without pathologic use of accessory muscles of respiration.  Well-healed right thoracotomy scar. CARDIOVASCULAR: Heart was regular without murmurs.  There were no carotid bruits. GI: The abdomen was soft, nontender, and nondistended. There were no palpable masses. There was no hepatosplenomegaly. There were normal bowel sounds in all quadrants. GU:  Rectal deferred.   MUSCULOSKELETAL:  Normal muscle strength and tone.  No clubbing or cyanosis.   SKIN:  There were no pathologic skin lesions.  There were no nodules on palpation. NEUROLOGIC:  Sensation is normal.  Cranial nerves are grossly intact. PSYCH:  Oriented to person, place and time.  Mood and affect are  normal.  Data Reviewed I have personally reviewed the CT scan and compared it to his prior CT scans.  I have personally reviewed the patient's imaging, laboratory findings and medical records.    Assessment    There is a left lower lobe airspace opacity with some hypodense areas which may represent an early abscess formation. There is a loculated pleural effusion. It is not clear to me if this represents an empyema or an exudative effusion.    Plan    My recommendation would be to ask interventional radiology to place a percutaneous pigtail catheter into the left pleural space. I would recommend that this be  sent for cytology as well as for the usual microbial studies and chemistries to assess the pleural fluid. If this is an early empyema we may be able to manage this with intrapleural thrombolytics.  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 He understands that we will try to manage this issue with percutaneous drainage and not with surgery initially. He also understands that there may the a role for surgery if this is not able to be managed percutaneously. All of his questions were answered.  Thank you very much for this consultation.       [name] 06/25/2014, 9:50 AM

## 2014-06-25 NOTE — Care Management (Signed)
Admitted to [facility] with the diagnosis of pneumonia. Granddaughter Velna Hatchet, lives with him [phone]) Daughter is Katharine Look [phone]). Sees Dr. Humphrey Rolls. Last seen on Friday of last week. No home health. No skilled facility. No home oxygen. Uses no aids for ambulation, still drives and works every day.  HFNC in place. IV Vancomycin and Levaquin continues. WBC's 20. Temperature = 98.8 Shelbie Ammons RN MSN Care Management [phone]

## 2014-06-25 NOTE — OR Nursing (Signed)
Reviewed with Dr Liborio Nixon order for thorocentesis, and in regards to recommendation note by dr Larey Dresser for pigtail insertion. Dr Genevive Bi called. Orders received to change to ct guided pigtail insertion. Dr Liborio Nixon aware pt not NPO. Primary care nurse rm [phone] notified that change of procedure type, to  [facility] Primary physician who ordered thorocentesis. Consent to be obtained. No sedation to be given per Dr Golden Circle.

## 2014-06-25 NOTE — Plan of Care (Signed)
Problem: Discharge Progression Outcomes Goal: Other Discharge Outcomes/Goals Plan of care progress to goal: Pain: med given x1 with improvement Diet: poor appetite Activity: generalized weakness Awaiting on Dr Genevive Bi for consult and possible chest tube placement

## 2014-06-25 NOTE — Progress Notes (Signed)
HFNC initiated for deteriorating sats and patient comfort

## 2014-06-26 ENCOUNTER — Inpatient Hospital Stay: Payer: PPO

## 2014-06-26 ENCOUNTER — Ambulatory Visit: Payer: Self-pay

## 2014-06-26 DIAGNOSIS — J90 Pleural effusion, not elsewhere classified: Secondary | ICD-10-CM

## 2014-06-26 LAB — CBC
HCT: 38.3 % — ABNORMAL LOW (ref 40.0–52.0)
HEMOGLOBIN: 11.8 g/dL — AB (ref 13.0–18.0)
MCH: 26.6 pg (ref 26.0–34.0)
MCHC: 30.9 g/dL — AB (ref 32.0–36.0)
MCV: 85.9 fL (ref 80.0–100.0)
PLATELETS: 284 10*3/uL (ref 150–440)
RBC: 4.46 MIL/uL (ref 4.40–5.90)
RDW: 14.5 % (ref 11.5–14.5)
WBC: 18 10*3/uL — ABNORMAL HIGH (ref 3.8–10.6)

## 2014-06-26 LAB — PATHOLOGIST SMEAR REVIEW

## 2014-06-26 LAB — VANCOMYCIN, TROUGH: Vancomycin Tr: 8 ug/mL — ABNORMAL LOW (ref 10–20)

## 2014-06-26 LAB — CREATININE, SERUM
CREATININE: 0.51 mg/dL — AB (ref 0.61–1.24)
GFR calc Af Amer: >60 mL/min (ref >60)

## 2014-06-26 MED ORDER — VANCOMYCIN HCL IN DEXTROSE 1-5 GM/200ML-% IV SOLN
1000.0000 mg | Freq: Three times a day (TID) | INTRAVENOUS | Status: DC
  Administered 2014-06-27 (×2): 1000 mg via INTRAVENOUS
  Filled 2014-06-26 (×5): qty 200

## 2014-06-26 MED ORDER — HYDROMORPHONE HCL 1 MG/ML IJ SOLN
1.0000 mg | INTRAMUSCULAR | Status: DC | PRN
  Administered 2014-06-26 – 2014-07-01 (×17): 1 mg via INTRAVENOUS
  Filled 2014-06-26 (×17): qty 1

## 2014-06-26 MED ORDER — ALBUTEROL SULFATE (2.5 MG/3ML) 0.083% IN NEBU
2.5000 mg | INHALATION_SOLUTION | Freq: Four times a day (QID) | RESPIRATORY_TRACT | Status: DC
  Administered 2014-06-26 – 2014-06-27 (×6): 2.5 mg via RESPIRATORY_TRACT
  Filled 2014-06-26 (×6): qty 3

## 2014-06-26 MED ORDER — OXYCODONE-ACETAMINOPHEN 5-325 MG PO TABS
1.0000 | ORAL_TABLET | ORAL | Status: DC | PRN
  Administered 2014-06-28 – 2014-07-02 (×7): 2 via ORAL
  Filled 2014-06-26 (×7): qty 2

## 2014-06-26 MED ORDER — ALTEPLASE 2 MG IJ SOLR
Freq: Once | INTRAMUSCULAR | Status: AC
  Administered 2014-06-26: 14:00:00 via INTRAPLEURAL
  Filled 2014-06-26: qty 10

## 2014-06-26 NOTE — Progress Notes (Signed)
Dr. Genevive Bi notified of chest tube still clamped after procedure. Stated for nurse to unclamp and restart low suction to 20cm. Read back, verified, & completed.   Hiram Gash [employer]

## 2014-06-26 NOTE — Progress Notes (Signed)
Patient ID: JOBANI SABADO, male   DOB: 23-Mar-1945, 69 y.o.   MRN: [id number]  HISTORY: Feels better today.  No fever.  Less pain.  Appetite improving.  Not short of breath.  No cough.   PERTINENT REVIEW OF SYSTEMS: No fever.  Not short of breath.  No cough  Filed Vitals:   06/26/14 0503  BP: 110/47  Pulse: 77  Temp: 98.2 F (36.8 C)  Resp: 20   Wt Readings from Last 3 Encounters:  06/26/14 43.148 kg (95 lb 2 oz)    EXAM:  Neck:  Normal range of motion. Neck supple. No tracheal deviation present. No thyromegaly present.  Resp: Lungs are clear bilaterally but diminished.  No respiratory distress, normal effort. Heart:  Regular without murmurs Abd:  Abdomen is soft, non distended and non tender. No masses are palpable.  There is no rebound and no guarding.  Neurological: Alert and oriented to person, place, and time. Coordination normal.   I have reviewed the results from the pleural fluid analysis.  WBC count is low but 80% neutrophils.  No air leak present today on vigorous cough  ASSESSMENT: Left pleural effusion in setting of probable pneumonia   PLAN:   Will obtain CXray.  If there is a substantial volume of pleural fluid, will try TPA. Will check on cultures Encourage physical therapy and ambulation    [name], MD

## 2014-06-26 NOTE — Plan of Care (Signed)
Problem: Discharge Progression Outcomes Goal: Other Discharge Outcomes/Goals Plan of care progress to goals:  1. Pain:      Morphine '2mg'$  IVP x 2 doses given for Left ribcage chest tube site pain.      Dr. Genevive Bi making rounds. Chest tube clamped. Plan is for Dr. Genevive Bi to return to unclamp chest tube at       1800. Per patient between 5 and 6pm.  2. Hemodynamically Stable:       Afebrile.       Remains on 40% HFNC with scheduled aerosol treatments.       Left chest tube in place with minimal drainage. Alteplase (Cathflo Activase) administered by Dr. Genevive Bi.          Clamped by Dr. Genevive Bi. 3. Diet:     Tolerating regular diet.     Tolerated Magic Cup and Ensure Supplement. 4. Activity:      1+ Assist.        No fall/injury this shift.

## 2014-06-26 NOTE — Plan of Care (Signed)
Problem: Discharge Progression Outcomes Goal: Other Discharge Outcomes/Goals Plan of care progress to goals: 1. C/o pain at chest tube insertion site relieved by Morphine. 2. Hemodynamically: VSS, afebrile. Continues on HFNC with scheduled nebs. Left chest tube in place with minimal drainage over night. 3. Tolerating diet with ensure ordered due to poor appetite 4. +1 assist due to generalized weakness  No fall/injury this shift. Will continue to assess.

## 2014-06-26 NOTE — Progress Notes (Signed)
[facility] [place] NAME: Rick Clark    MR#:  [id number]  DATE OF BIRTH:  [birth year]  SUBJECTIVE:  CHIEF COMPLAINT:   Chief Complaint  Patient presents with  . Pneumonia   Have c/o pain in left side chest after coughing. Requires oxygen. tolerating diet.   Tolerated chest tube placement 06/25/14.  REVIEW OF SYSTEMS:  CONSTITUTIONAL: No fever,positive fatigue and weakness. Loosing weight. EYES: No blurred or double vision.  EARS, NOSE, AND THROAT: No tinnitus or ear pain.  RESPIRATORY: positive cough, shortness of breath, no wheezing or hemoptysis.  CARDIOVASCULAR: positive chest pain with cough , no orthopnea, edema.  GASTROINTESTINAL: No nausea, vomiting, diarrhea or abdominal pain.  GENITOURINARY: No dysuria, hematuria.  ENDOCRINE: No polyuria, nocturia,  HEMATOLOGY: No anemia, easy bruising or bleeding SKIN: No rash or lesion. MUSCULOSKELETAL: No joint pain or arthritis.   NEUROLOGIC: No tingling, numbness, weakness.  PSYCHIATRY: No anxiety or depression.   ROS  DRUG ALLERGIES:  No Known Allergies  VITALS:  Blood pressure 103/52, pulse 88, temperature 98.6 F (37 C), temperature source Oral, resp. rate 20, height '5\' 5"'$  (1.651 m), weight 43.148 kg (95 lb 2 oz), SpO2 94 %.  PHYSICAL EXAMINATION:  GENERAL:  69 y.o.-year-old patient lying in the bed with no acute distress. Severe malnourished. Thin. EYES: Pupils equal, round, reactive to light and accommodation. No scleral icterus. Extraocular muscles intact.  HEENT: Head atraumatic, normocephalic. Oropharynx and nasopharynx clear.  NECK:  Supple, no jugular venous distention. No thyroid enlargement, no tenderness.  LUNGS:  breath sounds bilaterally, no wheezing, positive for  crepitation. Decreased air entry b/l lower lobes No use of accessory muscles of respiration. Left side chest tube in place. On HFNC oxygen. CARDIOVASCULAR: S1, S2 normal. No murmurs, rubs, or  gallops.  ABDOMEN: Soft, nontender, nondistended. Bowel sounds present. No organomegaly or mass.  EXTREMITIES: No pedal edema, cyanosis, or clubbing.  NEUROLOGIC: Cranial nerves II through XII are intact. Muscle strength 5/5 in all extremities. Sensation intact. Gait not checked.  PSYCHIATRIC: The patient is alert and oriented x 3.  SKIN: No obvious rash, lesion, or ulcer.   Physical Exam LABORATORY PANEL:   CBC  Recent Labs Lab 06/25/14 1129  WBC 18.0*  HGB 11.8*  HCT 38.3*  PLT 284   ------------------------------------------------------------------------------------------------------------------  Chemistries   Recent Labs Lab 06/22/14 1900  06/26/14 0507  NA 133*  --   --   K 3.9  --   --   CL 93*  --   --   CO2 30  --   --   GLUCOSE 141*  --   --   BUN 13  --   --   CREATININE 0.64  < > 0.51*  CALCIUM 8.9  --   --   AST 16  --   --   ALT 11*  --   --   ALKPHOS 82  --   --   BILITOT 0.6  --   --   < > = values in this interval not displayed. ------------------------------------------------------------------------------------------------------------------  Cardiac Enzymes  Recent Labs Lab 06/22/14 1900  TROPONINI <0.03   ------------------------------------------------------------------------------------------------------------------  RADIOLOGY:  Dg Chest 2 View  06/26/2014   CLINICAL DATA:  69 year old male status post left chest tube placement for pleural effusion. Lung cancer. Subsequent encounter.  EXAM: CHEST  2 VIEW  COMPARISON:  Chest CTA 06/22/2014  FINDINGS: Seated AP and lateral views of the chest. Left side pigtail chest tube  has been placed. No pneumothorax identified. Partial reduction of the left pleural effusion when compared to the recent CT chest scout view. Underlying chronic lung disease with emphysema. New confluent left perihilar and right lung base opacity. Stable cardiac size and mediastinal contours. No acute osseous abnormality  identified.  IMPRESSION: 1. Chronic lung disease with new bilateral pulmonary opacity most compatible with new bilateral pneumonia. 2. Pigtail left chest tube placed with partial drainage of the loculated left pleural effusion.   Electronically Signed   By: Genevie Ann M.D.   On: 06/26/2014 09:10   Dg Chest 2 View  06/25/2014   Inez Catalina, MD     06/25/2014  2:48 PM Left chest tube placement    Complications:  None  Blood Loss: none  See dictation in canopy pacs  Ct Image Guided Drainage By Percutaneous Catheter  06/25/2014   CLINICAL DATA:  Left-sided pleural effusion and pneumonia  EXAM: CT GUIDED DRAINAGE OF LEFT PLEURAL EFFUSION  ANESTHESIA/SEDATION: None  PROCEDURE: The procedure, risks, benefits, and alternatives were explained to the patient. Questions regarding the procedure were encouraged and answered. The patient understands and consents to the procedure.  The left lateral chest wall was prepped with chlorhexidinein a sterile fashion, and a sterile drape was applied covering the operative field. A sterile gown and sterile gloves were used for the procedure. Local anesthesia was provided with 1% Lidocaine.  Utilizing CT fluoroscopic guidance a 14 gauge pigtail catheter was placed into the large left-sided pleural effusion utilizing a quick stick technique. The catheter was placed low within the collection to allow for maximum drainage. A sample of the fluid was sent for laboratory evaluation. The fluid was clear and yellow and non purulent. Catheter was then sutured into place and dressed in the standard sterile manner. Catheter was then placed to external drainage utilizing an Oasis evacuation system.  COMPLICATIONS: None immediate  FINDINGS: The left pleural effusion has enlarged somewhat in the interval from the prior exam of 06/22/2014 persistent left lower lobe consolidation is noted. Incidental note is made of increasing right lower lobe infiltrate when compared with the prior exam.  IMPRESSION:  Successful CT-guided placement of a drainage catheter as described above. This catheter will be utilized for continued drainage of the pleural effusion and possible thrombolytic therapy as necessary.   Electronically Signed   By: Inez Catalina M.D.   On: 06/25/2014 15:29     ASSESSMENT AND PLAN:  This is a 69 year old Caucasian male admitted for pneumonia with parapneumonic effusion.  1. Pneumonia: Community-acquired, multilobar. Unclear if postobstructive but there is a concern for  malignancy as there is left-sided hilar soft tissue fullness. The left-sided pleural effusion is loculated   on vancomycin &  Levaquin   Consult Pulmonology appreciated- he called CT surgery for thoracentesis & Test pleural fluid.   The patient has a history of lung cancer and surgery, so suggested radiological drainage.  on 06/25/14- CT guided pig tail catheter placed, Dr. Genevive Bi did TPA for adhesion lysis on 06/26/14.   Fluid cytology and cx sent. 2. Tobacco abuse: NicoDerm patch, councelled to quit for 4 min.  3. Failure to thrive: The patient is moderately malnourished; admits to approximately 15 pounds of unintentional weight loss or 6 months. He denies nausea or vomiting. He may be hypermetabolic due to malignancy. 4. DVT prophylaxis: Heparin 5. GI prophylaxis: None 6. Severe malnutrition- will give supplements and check for underlying malignancy.      Wait for cytology from pleural fluids.  All the records are reviewed and case discussed with Care Management/Social Workerr. Management plans discussed with the patient, family and they are in agreement.  CODE STATUS: full  TOTAL TIME TAKING CARE OF THIS PATIENT: 35 minutes.   POSSIBLE D/C IN 3-4 DAYS, DEPENDING ON CLINICAL CONDITION.   Vaughan Basta M.D on 06/26/2014   Between 7am to 6pm - Pager - [phone]  After 6pm go to [email or web address] - password [email or web address] [facility]  Office  [phone]  CC: Primary care physician;  [name], MD

## 2014-06-26 NOTE — Progress Notes (Signed)
Nutrition Follow-up  DOCUMENTATION CODES:  Severe malnutrition in context of chronic illness  INTERVENTION:   (Medical Nutrition Supplement:) will recommend continuing Ensure BID for added nutrition, will request chocolate flavor only per pt preference; will also send Magic Cup lunch and dinner.  NUTRITION DIAGNOSIS:  Predicted suboptimal nutrient intake related to catabolic illness as evidenced by percent weight loss; ongoing  GOAL:  Patient will meet greater than or equal to 90% of their needs; ongoing  MONITOR:   (Energy intake, Pulmonary profile, )  ASSESSMENT:  Pt on HFNC this am reports feeling better this am.  Pt s/p chest tube placement yesterday.    PO Intake: pt reports eating much better this am, ham and cheese sandwich; pt family reports pt ate bites last night of dinner but did not see breakfast. Per RN Daleen Snook, pt ate around half of ham and cheese sandwich this am. Pt drinking Ensure on visit this am. Per MD Genevive Bi note, appetite improving.  Anthropometrics: weight trend since admission Filed Weights   06/24/14 0525 06/25/14 0320 06/26/14 0500  Weight: 96 lb 8 oz (43.772 kg) 97 lb 14.4 oz (44.407 kg) 95 lb 2 oz (43.148 kg)   BMI:  Body mass index is 15.83 kg/(m^2).    Estimated Nutritional Needs:  Kcal:  BEE 1306 kcals (IF 1.0-1.2, AF 1.3) 6962-9528 kcals/d (Using IBW of 62kg)  Protein:  1.0-1.2 g/d 62-74 g/d (Using IBW of 62kg)  Fluid:  (25-23m/kg) 1550-18632md Using IBW of 62kg)  Skin:  Reviewed, no issues  Diet Order:  Diet regular Room service appropriate?: Yes; Fluid consistency:: Thin  EDUCATION NEEDS:  No education needs identified at this time   Intake/Output Summary (Last 24 hours) at 06/26/14 1317 Last data filed at 06/26/14 0813  Gross per 24 hour  Intake    240 ml  Output    625 ml  Net   -385 ml    Last BM:  6/9  MODERATE Care Level  AlDwyane LuoRD, LDN Pager ([phone]

## 2014-06-27 LAB — CBC
HCT: 42.2 % (ref 40.0–52.0)
Hemoglobin: 13.1 g/dL (ref 13.0–18.0)
MCH: 25.9 pg — AB (ref 26.0–34.0)
MCHC: 31.1 g/dL — ABNORMAL LOW (ref 32.0–36.0)
MCV: 83.2 fL (ref 80.0–100.0)
PLATELETS: 330 10*3/uL (ref 150–440)
RBC: 5.07 MIL/uL (ref 4.40–5.90)
RDW: 14.1 % (ref 11.5–14.5)
WBC: 14.5 10*3/uL — AB (ref 3.8–10.6)

## 2014-06-27 LAB — BASIC METABOLIC PANEL
ANION GAP: 7 (ref 5–15)
BUN: 16 mg/dL (ref 6–20)
CALCIUM: 8.5 mg/dL — AB (ref 8.9–10.3)
CHLORIDE: 94 mmol/L — AB (ref 101–111)
CO2: 36 mmol/L — AB (ref 22–32)
Creatinine, Ser: 0.61 mg/dL (ref 0.61–1.24)
Glucose, Bld: 124 mg/dL — ABNORMAL HIGH (ref 65–99)
Potassium: 3.7 mmol/L (ref 3.5–5.1)
SODIUM: 137 mmol/L (ref 135–145)

## 2014-06-27 LAB — CULTURE, BLOOD (ROUTINE X 2)
CULTURE: NO GROWTH
Culture: NO GROWTH
SPECIAL REQUESTS: NORMAL
Special Requests: NORMAL

## 2014-06-27 MED ORDER — ALBUTEROL SULFATE (2.5 MG/3ML) 0.083% IN NEBU
2.5000 mg | INHALATION_SOLUTION | Freq: Four times a day (QID) | RESPIRATORY_TRACT | Status: DC
  Administered 2014-06-28 – 2014-07-02 (×18): 2.5 mg via RESPIRATORY_TRACT
  Filled 2014-06-27 (×18): qty 3

## 2014-06-27 MED ORDER — LEVOFLOXACIN 500 MG PO TABS
500.0000 mg | ORAL_TABLET | Freq: Every day | ORAL | Status: DC
  Administered 2014-06-28 – 2014-06-30 (×3): 500 mg via ORAL
  Filled 2014-06-27 (×3): qty 1

## 2014-06-27 NOTE — Plan of Care (Signed)
Problem: Discharge Progression Outcomes Goal: Other Discharge Outcomes/Goals Outcome: Progressing Plan of Care progress to goal: Continues with pain at chest tube site, controlled by Dilaudid IV '1mg'$ . Attempts to eat but only small amounts. Drinking Ensure well. Does not get out of bed, states too weak and does not wish to get out of bed. Remains on high flow oxygen at 40%. Continues with non-productive cough with rhonchi on breath sounds.

## 2014-06-27 NOTE — Plan of Care (Signed)
Problem: Discharge Progression Outcomes Goal: Other Discharge Outcomes/Goals Plan of care progress to goals: 1. C/o pain at chest tube insertion site relieved by Percocet x1 and Dilaudid x1. No pain at all since 2230 per pt. 2. Hemodynamically:              -VSS, afebrile. Continues on HFNC with scheduled nebs.              -Left chest tube in place with increased drainage overnight after unclamping after administration of Alteplase (Cathflo Activase) by Dr. Genevive Bi. earlier in the day  3. Tolerating diet with ensure ordered due to poor appetite 4. +1 assist due to generalized weakness   No fall/injury this shift. Will continue to assess.

## 2014-06-27 NOTE — Progress Notes (Signed)
[facility] [place] NAME: Rick Clark    MR#:  [id number]  DATE OF BIRTH:  [birth year]  SUBJECTIVE:  CHIEF COMPLAINT:   Chief Complaint  Patient presents with  . Pneumonia   Have c/o pain in left side chest after coughing. Requires oxygen. tolerating diet.   Tolerated chest tube placement 06/25/14. Feels better now- still need HFNC.  REVIEW OF SYSTEMS:  CONSTITUTIONAL: No fever,positive fatigue and weakness. Loosing weight. EYES: No blurred or double vision.  EARS, NOSE, AND THROAT: No tinnitus or ear pain.  RESPIRATORY: positive cough, shortness of breath, no wheezing or hemoptysis.  CARDIOVASCULAR: positive chest pain with cough , no orthopnea, edema.  GASTROINTESTINAL: No nausea, vomiting, diarrhea or abdominal pain.  GENITOURINARY: No dysuria, hematuria.  ENDOCRINE: No polyuria, nocturia,  HEMATOLOGY: No anemia, easy bruising or bleeding SKIN: No rash or lesion. MUSCULOSKELETAL: No joint pain or arthritis.   NEUROLOGIC: No tingling, numbness, weakness.  PSYCHIATRY: No anxiety or depression.   ROS  DRUG ALLERGIES:  No Known Allergies  VITALS:  Blood pressure 102/47, pulse 85, temperature 98.1 F (36.7 C), temperature source Oral, resp. rate 22, height '5\' 5"'$  (1.651 m), weight 42.502 kg (93 lb 11.2 oz), SpO2 91 %.  PHYSICAL EXAMINATION:  GENERAL:  69 y.o.-year-old patient lying in the bed with no acute distress. Severe malnourished. Thin. EYES: Pupils equal, round, reactive to light and accommodation. No scleral icterus. Extraocular muscles intact.  HEENT: Head atraumatic, normocephalic. Oropharynx and nasopharynx clear.  NECK:  Supple, no jugular venous distention. No thyroid enlargement, no tenderness.  LUNGS:  breath sounds bilaterally, no wheezing, positive for  crepitation. Decreased air entry b/l lower lobes No use of accessory muscles of respiration. Left side chest tube in place. On HFNC  oxygen. CARDIOVASCULAR: S1, S2 normal. No murmurs, rubs, or gallops.  ABDOMEN: Soft, nontender, nondistended. Bowel sounds present. No organomegaly or mass.  EXTREMITIES: No pedal edema, cyanosis, or clubbing.  NEUROLOGIC: Cranial nerves II through XII are intact. Muscle strength 5/5 in all extremities. Sensation intact. Gait not checked.  PSYCHIATRIC: The patient is alert and oriented x 3.  SKIN: No obvious rash, lesion, or ulcer.   Physical Exam LABORATORY PANEL:   CBC  Recent Labs Lab 06/27/14 0548  WBC 14.5*  HGB 13.1  HCT 42.2  PLT 330   ------------------------------------------------------------------------------------------------------------------  Chemistries   Recent Labs Lab 06/22/14 1900  06/27/14 0548  NA 133*  --  137  K 3.9  --  3.7  CL 93*  --  94*  CO2 30  --  36*  GLUCOSE 141*  --  124*  BUN 13  --  16  CREATININE 0.64  < > 0.61  CALCIUM 8.9  --  8.5*  AST 16  --   --   ALT 11*  --   --   ALKPHOS 82  --   --   BILITOT 0.6  --   --   < > = values in this interval not displayed. ------------------------------------------------------------------------------------------------------------------  Cardiac Enzymes  Recent Labs Lab 06/22/14 1900  TROPONINI <0.03   ------------------------------------------------------------------------------------------------------------------  RADIOLOGY:  Dg Chest 2 View  06/26/2014   CLINICAL DATA:  69 year old male status post left chest tube placement for pleural effusion. Lung cancer. Subsequent encounter.  EXAM: CHEST  2 VIEW  COMPARISON:  Chest CTA 06/22/2014  FINDINGS: Seated AP and lateral views of the chest. Left side pigtail chest tube has been placed. No pneumothorax identified. Partial reduction  of the left pleural effusion when compared to the recent CT chest scout view. Underlying chronic lung disease with emphysema. New confluent left perihilar and right lung base opacity. Stable cardiac size and  mediastinal contours. No acute osseous abnormality identified.  IMPRESSION: 1. Chronic lung disease with new bilateral pulmonary opacity most compatible with new bilateral pneumonia. 2. Pigtail left chest tube placed with partial drainage of the loculated left pleural effusion.   Electronically Signed   By: Genevie Ann M.D.   On: 06/26/2014 09:10   Dg Chest 2 View  06/25/2014   Inez Catalina, MD     06/25/2014  2:48 PM Left chest tube placement    Complications:  None  Blood Loss: none  See dictation in canopy pacs  Ct Image Guided Drainage By Percutaneous Catheter  06/25/2014   CLINICAL DATA:  Left-sided pleural effusion and pneumonia  EXAM: CT GUIDED DRAINAGE OF LEFT PLEURAL EFFUSION  ANESTHESIA/SEDATION: None  PROCEDURE: The procedure, risks, benefits, and alternatives were explained to the patient. Questions regarding the procedure were encouraged and answered. The patient understands and consents to the procedure.  The left lateral chest wall was prepped with chlorhexidinein a sterile fashion, and a sterile drape was applied covering the operative field. A sterile gown and sterile gloves were used for the procedure. Local anesthesia was provided with 1% Lidocaine.  Utilizing CT fluoroscopic guidance a 14 gauge pigtail catheter was placed into the large left-sided pleural effusion utilizing a quick stick technique. The catheter was placed low within the collection to allow for maximum drainage. A sample of the fluid was sent for laboratory evaluation. The fluid was clear and yellow and non purulent. Catheter was then sutured into place and dressed in the standard sterile manner. Catheter was then placed to external drainage utilizing an Oasis evacuation system.  COMPLICATIONS: None immediate  FINDINGS: The left pleural effusion has enlarged somewhat in the interval from the prior exam of 06/22/2014 persistent left lower lobe consolidation is noted. Incidental note is made of increasing right lower lobe infiltrate  when compared with the prior exam.  IMPRESSION: Successful CT-guided placement of a drainage catheter as described above. This catheter will be utilized for continued drainage of the pleural effusion and possible thrombolytic therapy as necessary.   Electronically Signed   By: Inez Catalina M.D.   On: 06/25/2014 15:29     ASSESSMENT AND PLAN:  This is a 69 year old Caucasian male admitted for pneumonia with parapneumonic effusion.  1. Pneumonia: Community-acquired, multilobar. Unclear if postobstructive but there is a concern for  malignancy as there is left-sided hilar soft tissue fullness. The left-sided pleural effusion is loculated   on vancomycin &  Levaquin   Consult Pulmonology appreciated- he called CT surgery for thoracentesis & Test pleural fluid.   The patient has a history of lung cancer and surgery, so suggested radiological drainage.  on 06/25/14- CT guided catheter placed, Dr. Genevive Bi did TPA for adhesion lysis on 06/26/14.   Fluid cytology and cx sent. So far cx negative.   Pt also hae improvement, so vancomycin d/ced ( 06/27/14) and just continue levaquin.  2. Tobacco abuse: NicoDerm patch, councelled to quit for 4 min.  3. Failure to thrive: The patient is moderately malnourished; admits to approximately 15 pounds of unintentional weight loss or 6 months.   suspected malignancy- but cytology from fluid is negative- I would suggest have a CT scan once pneumonia and effusion resolves. 4. DVT prophylaxis: Heparin 5. GI prophylaxis: None 6. Severe  malnutrition- will give supplements .  All the records are reviewed and case discussed with Care Management/Social Workerr. Management plans discussed with the patient, family and they are in agreement.  CODE STATUS: full  TOTAL TIME TAKING CARE OF THIS PATIENT: 35 minutes.   POSSIBLE D/C IN 3-4 DAYS, DEPENDING ON CLINICAL CONDITION.   Vaughan Basta M.D on 06/27/2014   Between 7am to 6pm - Pager - [phone]  After 6pm go  to [email or web address] - password [email or web address] [facility]  Office  [phone]  CC: Primary care physician; [name], MD

## 2014-06-28 DIAGNOSIS — J869 Pyothorax without fistula: Secondary | ICD-10-CM

## 2014-06-28 LAB — BODY FLUID CULTURE: Culture: NO GROWTH

## 2014-06-28 MED ORDER — HEPARIN SODIUM (PORCINE) 5000 UNIT/ML IJ SOLN
5000.0000 [IU] | Freq: Three times a day (TID) | INTRAMUSCULAR | Status: DC
  Administered 2014-06-28 – 2014-07-02 (×12): 5000 [IU] via SUBCUTANEOUS
  Filled 2014-06-28 (×13): qty 1

## 2014-06-28 MED ORDER — POLYETHYLENE GLYCOL 3350 17 G PO PACK
17.0000 g | PACK | Freq: Every day | ORAL | Status: DC | PRN
  Administered 2014-06-28 – 2014-06-29 (×2): 17 g via ORAL
  Filled 2014-06-28 (×2): qty 1

## 2014-06-28 NOTE — Progress Notes (Signed)
   06/28/14 1700  Clinical Encounter Type  Visited With Patient  Visit Type Initial  Stress Factors  Patient Stress Factors None identified  Chaplin rounded floor and made initial contact with patient and offered support.  Argentina Donovan Emani Taussig [phone]

## 2014-06-28 NOTE — Progress Notes (Signed)
CC: Left empyema Subjective: CT directed chest tube is in place draining empyema fluid. Patient has minimal pain at the insertion site. He is on supplemental oxygen.  Objective: Vital signs in last 24 hours: Temp:  [98.1 F (36.7 C)-98.4 F (36.9 C)] 98.1 F (36.7 C) (06/16 0525) Pulse Rate:  [82-88] 82 (06/16 0525) BP: (100-104)/(50-54) 100/50 mmHg (06/16 0525) SpO2:  [91 %-95 %] 95 % (06/16 0525) FiO2 (%):  [35 %-40 %] 40 % (06/15 2024) Weight:  [40.007 kg (88 lb 3.2 oz)] 40.007 kg (88 lb 3.2 oz) (06/16 0500) Last BM Date: 06/21/14  Intake/Output from previous day: 06/15 0701 - 06/16 0700 In: 6 [I.V.:6] Out: 990 [Urine:475; Chest Tube:515] Intake/Output this shift:    Physical exam:  Functional left chest tube. Chest shows bilateral wheezes. Nontender calves  Lab Results: CBC   Recent Labs  06/25/14 1129 06/27/14 0548  WBC 18.0* 14.5*  HGB 11.8* 13.1  HCT 38.3* 42.2  PLT 284 330   BMET  Recent Labs  06/26/14 0507 06/27/14 0548  NA  --  137  K  --  3.7  CL  --  94*  CO2  --  36*  GLUCOSE  --  124*  BUN  --  16  CREATININE 0.51* 0.61  CALCIUM  --  8.5*   PT/INR No results for input(s): LABPROT, INR in the last 72 hours. ABG No results for input(s): PHART, HCO3 in the last 72 hours.  Invalid input(s): PCO2, PO2  Studies/Results: Dg Chest 2 View  06/26/2014   CLINICAL DATA:  69 year old male status post left chest tube placement for pleural effusion. Lung cancer. Subsequent encounter.  EXAM: CHEST  2 VIEW  COMPARISON:  Chest CTA 06/22/2014  FINDINGS: Seated AP and lateral views of the chest. Left side pigtail chest tube has been placed. No pneumothorax identified. Partial reduction of the left pleural effusion when compared to the recent CT chest scout view. Underlying chronic lung disease with emphysema. New confluent left perihilar and right lung base opacity. Stable cardiac size and mediastinal contours. No acute osseous abnormality identified.   IMPRESSION: 1. Chronic lung disease with new bilateral pulmonary opacity most compatible with new bilateral pneumonia. 2. Pigtail left chest tube placed with partial drainage of the loculated left pleural effusion.   Electronically Signed   By: Genevie Ann M.D.   On: 06/26/2014 09:10    Anti-infectives: Anti-infectives    Start     Dose/Rate Route Frequency Ordered Stop   06/28/14 1000  levofloxacin (LEVAQUIN) tablet 500 mg     500 mg Oral Daily 06/27/14 1312     06/27/14 0000  vancomycin (VANCOCIN) IVPB 1000 mg/200 mL premix  Status:  Discontinued     1,000 mg 200 mL/hr over 60 Minutes Intravenous Every 8 hours 06/26/14 1632 06/27/14 1312   06/25/14 1600  vancomycin (VANCOCIN) IVPB 750 mg/150 ml premix  Status:  Discontinued     750 mg 150 mL/hr over 60 Minutes Intravenous Every 12 hours 06/25/14 1512 06/26/14 1631   06/24/14 1800  levofloxacin (LEVAQUIN) IVPB 750 mg  Status:  Discontinued     750 mg 100 mL/hr over 90 Minutes Intravenous Every 48 hours 06/23/14 0023 06/24/14 1054   06/24/14 1800  vancomycin (VANCOCIN) IVPB 750 mg/150 ml premix  Status:  Discontinued     750 mg 150 mL/hr over 60 Minutes Intravenous Every 18 hours 06/24/14 1054 06/25/14 1512   06/24/14 1200  levofloxacin (LEVAQUIN) IVPB 500 mg  Status:  Discontinued  500 mg 100 mL/hr over 60 Minutes Intravenous Every 24 hours 06/24/14 1054 06/27/14 1312   06/23/14 0600  vancomycin (VANCOCIN) 500 mg in sodium chloride 0.9 % 100 mL IVPB  Status:  Discontinued     500 mg 100 mL/hr over 60 Minutes Intravenous Every 18 hours 06/22/14 2326 06/24/14 1054   06/22/14 2300  vancomycin (VANCOCIN) IVPB 1000 mg/200 mL premix  Status:  Discontinued     1,000 mg 200 mL/hr over 60 Minutes Intravenous  Once 06/22/14 2259 06/22/14 2309   06/22/14 2245  vancomycin (VANCOCIN) 500 mg in sodium chloride 0.9 % 100 mL IVPB     500 mg 100 mL/hr over 60 Minutes Intravenous STAT 06/22/14 2231 06/23/14 0039   06/22/14 2045  levofloxacin (LEVAQUIN)  IVPB 750 mg     750 mg 100 mL/hr over 90 Minutes Intravenous  Once 06/22/14 2032 06/22/14 2246      Assessment/Plan:  Left-sided empyema. Chest tube in place. Dr. Faith Rogue asked that chest tube be left in place for several days. I will continue to follow the patient and reviewed his chest x-ray but no plans for chest tube removal at this time.  Florene Glen, MD, FACS  06/28/2014

## 2014-06-28 NOTE — Progress Notes (Signed)
Dressing changed to chest tube site, area was cleaned with iodine and new dressing was applied. Area around site clean and dry, stitches intact. 4x4 and Tegaderm dressing applied, change q day per order.

## 2014-06-28 NOTE — Progress Notes (Signed)
ANTIBIOTIC CONSULT NOTE - INITIAL  Pharmacy Consult for Vancomycin/Levaquin Indication: pneumonia  No Known Allergies  Patient Measurements: Height: '5\' 5"'$  (165.1 cm) Weight: 88 lb 3.2 oz (40.007 kg) IBW/kg (Calculated) : 61.5  Vital Signs: Temp: 98.1 F (36.7 C) (06/16 0525) Temp Source: Oral (06/16 0525) BP: 100/50 mmHg (06/16 0525) Pulse Rate: 82 (06/16 0525)  Labs:  Recent Labs  06/25/14 1129 06/26/14 0507 06/27/14 0548  WBC 18.0*  --  14.5*  HGB 11.8*  --  13.1  PLT 284  --  330  CREATININE  --  0.51* 0.61   Estimated Creatinine Clearance: 48.6 mL/min (by C-G formula based on Cr of 0.61).  Kinetics:   Ke: 0.045, Vd: 27.9   Microbiology: Recent Results (from the past 720 hour(s))  Blood culture (routine x 2)     Status: None   Collection Time: 06/22/14  6:38 PM  Result Value Ref Range Status   Specimen Description BLOOD  Final   Special Requests Normal  Final   Culture NO GROWTH 5 DAYS  Final   Report Status 06/27/2014 FINAL  Final  Blood culture (routine x 2)     Status: None   Collection Time: 06/22/14  6:44 PM  Result Value Ref Range Status   Specimen Description BLOOD  Final   Special Requests Normal  Final   Culture NO GROWTH 5 DAYS  Final   Report Status 06/27/2014 FINAL  Final  Body fluid culture     Status: None (Preliminary result)   Collection Time: 06/25/14  2:30 PM  Result Value Ref Range Status   Specimen Description PLEURAL  Final   Special Requests NONE  Final   Gram Stain PENDING  Incomplete   Culture NO GROWTH 2 DAYS  Final   Report Status PENDING  Incomplete    Medical History: Past Medical History  Diagnosis Date  . Lung cancer   . Pneumonia Nov '15    Anti-infectives    Start     Dose/Rate Route Frequency Ordered Stop   06/28/14 1000  levofloxacin (LEVAQUIN) tablet 500 mg     500 mg Oral Daily 06/27/14 1312     06/27/14 0000  vancomycin (VANCOCIN) IVPB 1000 mg/200 mL premix  Status:  Discontinued     1,000 mg 200 mL/hr  over 60 Minutes Intravenous Every 8 hours 06/26/14 1632 06/27/14 1312   06/25/14 1600  vancomycin (VANCOCIN) IVPB 750 mg/150 ml premix  Status:  Discontinued     750 mg 150 mL/hr over 60 Minutes Intravenous Every 12 hours 06/25/14 1512 06/26/14 1631   06/24/14 1800  levofloxacin (LEVAQUIN) IVPB 750 mg  Status:  Discontinued     750 mg 100 mL/hr over 90 Minutes Intravenous Every 48 hours 06/23/14 0023 06/24/14 1054   06/24/14 1800  vancomycin (VANCOCIN) IVPB 750 mg/150 ml premix  Status:  Discontinued     750 mg 150 mL/hr over 60 Minutes Intravenous Every 18 hours 06/24/14 1054 06/25/14 1512   06/24/14 1200  levofloxacin (LEVAQUIN) IVPB 500 mg  Status:  Discontinued     500 mg 100 mL/hr over 60 Minutes Intravenous Every 24 hours 06/24/14 1054 06/27/14 1312   06/23/14 0600  vancomycin (VANCOCIN) 500 mg in sodium chloride 0.9 % 100 mL IVPB  Status:  Discontinued     500 mg 100 mL/hr over 60 Minutes Intravenous Every 18 hours 06/22/14 2326 06/24/14 1054   06/22/14 2300  vancomycin (VANCOCIN) IVPB 1000 mg/200 mL premix  Status:  Discontinued  1,000 mg 200 mL/hr over 60 Minutes Intravenous  Once 06/22/14 2259 06/22/14 2309   06/22/14 2245  vancomycin (VANCOCIN) 500 mg in sodium chloride 0.9 % 100 mL IVPB     500 mg 100 mL/hr over 60 Minutes Intravenous STAT 06/22/14 2231 06/23/14 0039   06/22/14 2045  levofloxacin (LEVAQUIN) IVPB 750 mg     750 mg 100 mL/hr over 90 Minutes Intravenous  Once 06/22/14 2032 06/22/14 2246     Assessment: Patient with CAP and empyema ordered Levofloxacin. Patient is currently on day 7 of levofloxacin, currently '500mg'$  PO Q24hr. Patient previously received vancomycin x 6 days (last dose 6/15).     Plan:   1. Will continue levaquin '500mg'$  PO Q24H. If creatinine clearance remains < 52m/min tomorrow, will transition patient to levofloxacin '250mg'$  PO Q24hr.    Pharmacy will continue to monitor and adjust per consult.    MCurrie Paris PharmD 06/28/2014

## 2014-06-28 NOTE — Progress Notes (Signed)
On call MD notified that patient still has been unable to have a bowel movement (since June 9) after Miralax and Senokot given last night. MD stated to give both tonight which are ordered PRN and let rounding doctor know tomorrow if they continue to be ineffective.   Hiram Gash [employer]

## 2014-06-28 NOTE — Progress Notes (Signed)
[facility] at [place] NAME: Rick Clark    MR#:  [id number]  DATE OF BIRTH:  Dec 21, 1945  SUBJECTIVE:  CHIEF COMPLAINT:   Chief Complaint  Patient presents with  . Pneumonia   Have c/o pain in left side chest after coughing. Requires oxygen. tolerating diet.   Tolerated chest tube placement 06/25/14. Feels better now- still need HFNC. Pain at the tube insertion site.  REVIEW OF SYSTEMS:  CONSTITUTIONAL: No fever,positive fatigue and weakness. Loosing weight. EYES: No blurred or double vision.  EARS, NOSE, AND THROAT: No tinnitus or ear pain.  RESPIRATORY: positive cough, shortness of breath, no wheezing or hemoptysis.  CARDIOVASCULAR: positive chest pain with cough , no orthopnea, edema.  GASTROINTESTINAL: No nausea, vomiting, diarrhea or abdominal pain.  GENITOURINARY: No dysuria, hematuria.  ENDOCRINE: No polyuria, nocturia,  HEMATOLOGY: No anemia, easy bruising or bleeding SKIN: No rash or lesion. MUSCULOSKELETAL: No joint pain or arthritis.   NEUROLOGIC: No tingling, numbness, weakness.  PSYCHIATRY: No anxiety or depression.   ROS  DRUG ALLERGIES:  No Known Allergies  VITALS:  Blood pressure 100/50, pulse 82, temperature 98.1 F (36.7 C), temperature source Oral, resp. rate 22, height '5\' 5"'$  (1.651 m), weight 40.007 kg (88 lb 3.2 oz), SpO2 94 %.  PHYSICAL EXAMINATION:  GENERAL:  69 y.o.-year-old patient lying in the bed with no acute distress. Severe malnourished. Thin. EYES: Pupils equal, round, reactive to light and accommodation. No scleral icterus. Extraocular muscles intact.  HEENT: Head atraumatic, normocephalic. Oropharynx and nasopharynx clear.  NECK:  Supple, no jugular venous distention. No thyroid enlargement, no tenderness.  LUNGS:  breath sounds bilaterally, no wheezing, positive for  crepitation. Decreased air entry b/l lower lobes No use of accessory muscles of respiration. Left side chest tube in  place. On HFNC oxygen. CARDIOVASCULAR: S1, S2 normal. No murmurs, rubs, or gallops.  ABDOMEN: Soft, nontender, nondistended. Bowel sounds present. No organomegaly or mass.  EXTREMITIES: No pedal edema, cyanosis, or clubbing.  NEUROLOGIC: Cranial nerves II through XII are intact. Muscle strength 5/5 in all extremities. Sensation intact. Gait not checked.  PSYCHIATRIC: The patient is alert and oriented x 3.  SKIN: No obvious rash, lesion, or ulcer.   Physical Exam LABORATORY PANEL:   CBC  Recent Labs Lab 06/27/14 0548  WBC 14.5*  HGB 13.1  HCT 42.2  PLT 330   ------------------------------------------------------------------------------------------------------------------  Chemistries   Recent Labs Lab 06/22/14 1900  06/27/14 0548  NA 133*  --  137  K 3.9  --  3.7  CL 93*  --  94*  CO2 30  --  36*  GLUCOSE 141*  --  124*  BUN 13  --  16  CREATININE 0.64  < > 0.61  CALCIUM 8.9  --  8.5*  AST 16  --   --   ALT 11*  --   --   ALKPHOS 82  --   --   BILITOT 0.6  --   --   < > = values in this interval not displayed. ------------------------------------------------------------------------------------------------------------------  Cardiac Enzymes  Recent Labs Lab 06/22/14 1900  TROPONINI <0.03   ------------------------------------------------------------------------------------------------------------------  RADIOLOGY:  No results found.   ASSESSMENT AND PLAN:  This is a 69 year old Caucasian male admitted for pneumonia with parapneumonic effusion.  1. Pneumonia: Community-acquired, multilobar. Unclear if postobstructive but there is a concern for  malignancy as there is left-sided hilar soft tissue fullness. The left-sided pleural effusion is loculated   on vancomycin &  Levaquin   Consult Pulmonology appreciated- he called CT surgery for thoracentesis & Test pleural fluid.   The patient has a history of lung cancer and surgery, so suggested radiological  drainage.  on 06/25/14- CT guided catheter placed, Dr. Genevive Bi did TPA for adhesion lysis on 06/26/14.   Fluid cytology and cx sent. So far cx negative.   Pt also hae improvement, so vancomycin d/ced ( 06/27/14) and just continue levaquin.   As per surgery - still need to keep tube in for few days- repeat xray chest.  2. Tobacco abuse: NicoDerm patch, councelled to quit for 4 min.  3. Failure to thrive: The patient is moderately malnourished; admits to approximately 15 pounds of unintentional weight loss or 6 months.   suspected malignancy- but cytology from fluid is negative- I would suggest have a CT scan once pneumonia and effusion resolves. 4. DVT prophylaxis: Heparin 5. GI prophylaxis: None 6. Severe malnutrition- will give supplements .  All the records are reviewed and case discussed with Care Management/Social Workerr. Management plans discussed with the patient, family and they are in agreement.  CODE STATUS: full  TOTAL TIME TAKING CARE OF THIS PATIENT: 30 minutes.   POSSIBLE D/C IN 3-4 DAYS, DEPENDING ON CLINICAL CONDITION.   Vaughan Basta M.D on 06/28/2014   Between 7am to 6pm - Pager - [phone]  After 6pm go to [email or web address] - password [email or web address] [facility]  Office  [phone]  CC: Primary care physician; [name], MD

## 2014-06-28 NOTE — Plan of Care (Signed)
Problem: Discharge Progression Outcomes Goal: Other Discharge Outcomes/Goals Outcome: Progressing Patient is alert and oriented, remains bedbound. Utilized urinal, senna given due to last BM not since 6/13. Pt has pain at chest tube side, dilaudid given with improvement.

## 2014-06-29 ENCOUNTER — Inpatient Hospital Stay: Payer: PPO

## 2014-06-29 MED ORDER — SENNA 8.6 MG PO TABS
1.0000 | ORAL_TABLET | Freq: Every day | ORAL | Status: DC
  Administered 2014-06-29: 8.6 mg via ORAL
  Filled 2014-06-29 (×4): qty 1

## 2014-06-29 MED ORDER — DOCUSATE SODIUM 100 MG PO CAPS
100.0000 mg | ORAL_CAPSULE | Freq: Two times a day (BID) | ORAL | Status: DC
  Administered 2014-06-29 – 2014-06-30 (×3): 100 mg via ORAL
  Filled 2014-06-29 (×7): qty 1

## 2014-06-29 NOTE — Plan of Care (Addendum)
Problem: Discharge Progression Outcomes Goal: Other Discharge Outcomes/Goals Outcome: Progressing Patient had chest xray this am showed improvement compared to last xray.  Chest tube still in place and not draining much drainage.  Patient continues to have some pain at chest tube insertion site PRN pain medication managing pain.  Patient not eating much [place] Daughter has been bringing food from outside and he is eating that a little better.  Saturations are maintaining on 4 liters [place].  Spoke with Dr. Burt Knack this afternoon and the plan is to keep chest tube in place until Monday.  Patient with positive bowel sounds no bowel movement today, miralax, stool softener, and senna given.

## 2014-06-29 NOTE — Progress Notes (Signed)
Nutrition Follow-up  DOCUMENTATION CODES:  Severe malnutrition in context of chronic illness  INTERVENTION: Medical Food Supplement Therapy: will recommend continuing Ensure Enlive (each supplement provides 350kcal and 20 grams of protein) BID, and Magic Cup BID Meals and Snacks: Cater to patient preferences Coordination of Care: pt may need stronger bowel regimen secondary to last BM 6/9 Education: RD provided "High-Calorie High-Protein Nutrition Therapy" handout from the Academy of [employer] Dietetics.  Provided examples on ways to increase caloric density of foods and beverages frequently consumed by the patient. Also provided ideas to promote variety and to incorporate additional nutrient dense foods into patient's diet. Discussed eating small frequent meals and snacks to assist in increasing overall po intake. Teach back method used.  Expect good compliance.   NUTRITION DIAGNOSIS:  Predicted suboptimal nutrient intake related to catabolic illness as evidenced by percent weight loss, ongoing  GOAL:  Patient will meet greater than or equal to 90% of their needs; ongoing  MONITOR:   (Energy intake, Pulmonary profile, )   ASSESSMENT:  Pt on nasal cannula this am on visit, chest tube remains in place.   Current Nutrition: pt reports not eating this am as he was waiting on granddaughter to bring food from outside for breakfast. On visit, granddaughter brought in biscuit from [place] for pt to eat; pt drinking Ensure on visit. Pt reports most of meals eaten recently from community.  RN Mendel Ryder reports pt eats much better when family brings it in versus meals delivered by dining services. Pt reports drinking Ensures and eating 'some' of Magic cups. Per I and O chart pt ate 60% of lunch yesterday.  Gastrointestinal Profile: last BM 6/9  Medications: Miralax prn, senokot prn Labs:  Recent Labs Lab 06/22/14 1900 06/24/14 0457 06/26/14 0507 06/27/14 0548  NA 133*  --    --  137  K 3.9  --   --  3.7  CL 93*  --   --  94*  CO2 30  --   --  36*  BUN 13  --   --  16  CREATININE 0.64 0.66 0.51* 0.61  CALCIUM 8.9  --   --  8.5*  GLUCOSE 141*  --   --  124*    Anthropometrics: Filed Weights   06/27/14 0525 06/28/14 0500 06/29/14 0309  Weight: 93 lb 11.2 oz (42.502 kg) 88 lb 3.2 oz (40.007 kg) 87 lb 6.4 oz (39.644 kg)    BMI:  Body mass index is 14.54 kg/(m^2).  Estimated Nutritional Needs:  Kcal:  BEE 1306 kcals (IF 1.0-1.2, AF 1.3) 6659-9357 kcals/d (Using IBW of 62kg)  Protein:  1.0-1.2 g/d 62-74 g/d (Using IBW of 62kg)  Fluid:  (25-21m/kg) 1550-18658md Using IBW of 62kg)  Skin:  Reviewed, no issues  Diet Order:  Diet regular Room service appropriate?: Yes; Fluid consistency:: Thin  EDUCATION NEEDS:  Education needs addressed   Intake/Output Summary (Last 24 hours) at 06/29/14 1228 Last data filed at 06/29/14 0800  Gross per 24 hour  Intake    240 ml  Output    780 ml  Net   -540 ml     MODERATE Care Level  AlDwyane LuoRD, LDN Pager [phone]

## 2014-06-29 NOTE — Progress Notes (Signed)
[facility] at [place] NAME: Rick Clark    MR#:  [id number]  DATE OF BIRTH:  1945/08/25  SUBJECTIVE:  CHIEF COMPLAINT:   Chief Complaint  Patient presents with  . Pneumonia       Tolerated chest tube placement 06/25/14. Feels better now- last 24 hrs only had 30 ml drainage from chest tube, and now he is on nasal canula oxygen, feels much better- but have pain at tube insertion site.  REVIEW OF SYSTEMS:  CONSTITUTIONAL: No fever,positive fatigue and weakness. Loosing weight. EYES: No blurred or double vision.  EARS, NOSE, AND THROAT: No tinnitus or ear pain.  RESPIRATORY: positive cough, less shortness of breath now , no wheezing or hemoptysis.  CARDIOVASCULAR: positive chest pain with cough , no orthopnea, edema.  GASTROINTESTINAL: No nausea, vomiting, diarrhea or abdominal pain.  GENITOURINARY: No dysuria, hematuria.  ENDOCRINE: No polyuria, nocturia,  HEMATOLOGY: No anemia, easy bruising or bleeding SKIN: No rash or lesion. MUSCULOSKELETAL: No joint pain or arthritis.   NEUROLOGIC: No tingling, numbness, weakness.  PSYCHIATRY: No anxiety or depression.   ROS  DRUG ALLERGIES:  No Known Allergies  VITALS:  Blood pressure 115/62, pulse 81, temperature 98.4 F (36.9 C), temperature source Oral, resp. rate 18, height '5\' 5"'$  (1.651 m), weight 39.644 kg (87 lb 6.4 oz), SpO2 97 %.  PHYSICAL EXAMINATION:  GENERAL:  69 y.o.-year-old patient lying in the bed with no acute distress. Severe malnourished. Thin. EYES: Pupils equal, round, reactive to light and accommodation. No scleral icterus. Extraocular muscles intact.  HEENT: Head atraumatic, normocephalic. Oropharynx and nasopharynx clear.  NECK:  Supple, no jugular venous distention. No thyroid enlargement, no tenderness.  LUNGS:  breath sounds bilaterally, no wheezing, positive for  crepitation. Decreased air entry b/l lower lobes No use of accessory muscles of respiration.  Left side chest tube in place. On HFNC oxygen. CARDIOVASCULAR: S1, S2 normal. No murmurs, rubs, or gallops.  ABDOMEN: Soft, nontender, nondistended. Bowel sounds present. No organomegaly or mass.  EXTREMITIES: No pedal edema, cyanosis, or clubbing.  NEUROLOGIC: Cranial nerves II through XII are intact. Muscle strength 5/5 in all extremities. Sensation intact. Gait not checked.  PSYCHIATRIC: The patient is alert and oriented x 3.  SKIN: No obvious rash, lesion, or ulcer.   Physical Exam LABORATORY PANEL:   CBC  Recent Labs Lab 06/27/14 0548  WBC 14.5*  HGB 13.1  HCT 42.2  PLT 330   ------------------------------------------------------------------------------------------------------------------  Chemistries   Recent Labs Lab 06/22/14 1900  06/27/14 0548  NA 133*  --  137  K 3.9  --  3.7  CL 93*  --  94*  CO2 30  --  36*  GLUCOSE 141*  --  124*  BUN 13  --  16  CREATININE 0.64  < > 0.61  CALCIUM 8.9  --  8.5*  AST 16  --   --   ALT 11*  --   --   ALKPHOS 82  --   --   BILITOT 0.6  --   --   < > = values in this interval not displayed. ------------------------------------------------------------------------------------------------------------------  Cardiac Enzymes  Recent Labs Lab 06/22/14 1900  TROPONINI <0.03   ------------------------------------------------------------------------------------------------------------------  RADIOLOGY:  Dg Chest 2 View  06/29/2014   CLINICAL DATA:  Pleural effusion, chest tube  EXAM: CHEST  2 VIEW  COMPARISON:  06/26/2014  FINDINGS: Cardiomediastinal silhouette is stable. Hyperinflation again noted. Residual bilateral small basilar infiltrates with improvement from prior  exam. Stable left basilar pigtail pleural catheter. There is no pneumothorax. No pulmonary edema.  IMPRESSION: Hyperinflation again noted. Residual bilateral basilar infiltrates with improvement from prior exam. Stable left basilar pigtail pleural catheter. No  pneumothorax.   Electronically Signed   By: Lahoma Crocker M.D.   On: 06/29/2014 08:20     ASSESSMENT AND PLAN:  This is a 69 year old Caucasian male admitted for pneumonia with parapneumonic effusion.  1. Pneumonia: Community-acquired, multilobar. Unclear if postobstructive but there is a concern for  malignancy as there is  left-sided hilar soft tissue fullness. The left-sided pleural effusion is loculated   on vancomycin &  Levaquin   Consult Pulmonology appreciated- he called CT surgery for thoracentesis & Test pleural fluid.   The patient has a history of lung cancer and surgery, so suggested radiological drainage.  on 06/25/14- CT guided catheter placed, Dr. Genevive Bi did TPA for adhesion lysis on 06/26/14.   Fluid cytology and cx sent. So far cx negative.   Pt also had improvement, so vancomycin d/ced ( 06/27/14) and just continue levaquin.   As per surgery - still need to keep tube in for few days- repeat xray chest shows improvement.   Pt now had minimal drainage in last 24 hrs- may d/c tube soon- will let surgical team decide.  2. Tobacco abuse: NicoDerm patch, councelled to quit for 4 min.  3. Failure to thrive: The patient is moderately malnourished; admits to approximately 15 pounds of unintentional weight loss or 6 months.   suspected malignancy- but cytology from fluid is negative- I would suggest have a CT scan once pneumonia and effusion resolves. 4. DVT prophylaxis: Heparin 5. GI prophylaxis: None 6. Severe malnutrition- will give supplements .  All the records are reviewed and case discussed with Care Management/Social Workerr. Management plans discussed with the patient, family and they are in agreement.  CODE STATUS: full  TOTAL TIME TAKING CARE OF THIS PATIENT: 30 minutes.   POSSIBLE D/C IN 3-4 DAYS, DEPENDING ON CLINICAL CONDITION.   Vaughan Basta M.D on 06/29/2014   Between 7am to 6pm - Pager - [phone]  After 6pm go to [email or web address] - password [email or web address]  [facility]  Office  [phone]  CC: Primary care physician; [name], MD

## 2014-06-29 NOTE — Plan of Care (Signed)
Problem: Discharge Progression Outcomes Goal: Other Discharge Outcomes/Goals Plan of care progress to goals: 1. C/o pain at chest tube insertion site relieved by Dilaudid x2.  2. Hemodynamically:               -VSS, afebrile. Stable O2 sats on 4L [place].             -Left chest tube in place with decreased drainage.  3. Tolerating diet with ensure ordered due to poor appetite 4. No BM since 06/21/2014. Miralax and Senna given with no results at this time.  No fall/injury this shift. Will continue to assess.

## 2014-06-30 DIAGNOSIS — J189 Pneumonia, unspecified organism: Principal | ICD-10-CM

## 2014-06-30 DIAGNOSIS — J948 Other specified pleural conditions: Secondary | ICD-10-CM

## 2014-06-30 NOTE — Plan of Care (Signed)
Problem: Discharge Progression Outcomes Goal: Discharge plan in place and appropriate Individualization of Care  Outcome: Progressing Care Plan note: Pain: Dilaudid IV given x2 for discomfort to left side today - good results. Hemodynamics: Vital signs stable - although BP slightly low at 103/52. O2 [place] weaned - to room air - patient maintaining O2 sats >92. Chest tube in place to 20cm water suction. Drained 2m during the day today - clear-looking fluid. Dressing changed - insertion site clean and dry and intact. Complications: Patient coughing - productive of thick yellow phlem. Insentive spirometer and moving around more encouraged. Constipation: patient had large bowel movement today in am. Did not want any more laxatives today. Diet: Small appetite - patient appears very slender. Activity: Patient remained in bed, but was able to move about in bed. Plan: Re-assessment of chest tube on Monday. No distress noted.

## 2014-06-30 NOTE — Progress Notes (Signed)
CC: Left chest empyema Subjective: Much less pain today feels well.  Objective: Vital signs in last 24 hours: Temp:  [98 F (36.7 C)-98.4 F (36.9 C)] 98 F (36.7 C) (06/18 0446) Pulse Rate:  [73-90] 82 (06/18 0446) Resp:  [18] 18 (06/18 0446) BP: (107-112)/(49-58) 111/58 mmHg (06/18 0446) SpO2:  [94 %-100 %] 94 % (06/18 0820) FiO2 (%):  [24 %] 24 % (06/18 0820) Weight:  [81 lb 4.8 oz (36.877 kg)] 81 lb 4.8 oz (36.877 kg) (06/18 0455) Last BM Date: 06/21/14  Intake/Output from previous day: 06/17 0701 - 06/18 0700 In: 120 [P.O.:120] Out: 875 [Urine:875] Intake/Output this shift:    Physical exam:  Chest tubes functional wheezes bilateral.  Lab Results: CBC  No results for input(s): WBC, HGB, HCT, PLT in the last 72 hours. BMET No results for input(s): NA, K, CL, CO2, GLUCOSE, BUN, CREATININE, CALCIUM in the last 72 hours. PT/INR No results for input(s): LABPROT, INR in the last 72 hours. ABG No results for input(s): PHART, HCO3 in the last 72 hours.  Invalid input(s): PCO2, PO2  Studies/Results: Dg Chest 2 View  06/29/2014   CLINICAL DATA:  Pleural effusion, chest tube  EXAM: CHEST  2 VIEW  COMPARISON:  06/26/2014  FINDINGS: Cardiomediastinal silhouette is stable. Hyperinflation again noted. Residual bilateral small basilar infiltrates with improvement from prior exam. Stable left basilar pigtail pleural catheter. There is no pneumothorax. No pulmonary edema.  IMPRESSION: Hyperinflation again noted. Residual bilateral basilar infiltrates with improvement from prior exam. Stable left basilar pigtail pleural catheter. No pneumothorax.   Electronically Signed   By: Lahoma Crocker M.D.   On: 06/29/2014 08:20    Anti-infectives: Anti-infectives    Start     Dose/Rate Route Frequency Ordered Stop   06/28/14 1000  levofloxacin (LEVAQUIN) tablet 500 mg     500 mg Oral Daily 06/27/14 1312     06/27/14 0000  vancomycin (VANCOCIN) IVPB 1000 mg/200 mL premix  Status:  Discontinued      1,000 mg 200 mL/hr over 60 Minutes Intravenous Every 8 hours 06/26/14 1632 06/27/14 1312   06/25/14 1600  vancomycin (VANCOCIN) IVPB 750 mg/150 ml premix  Status:  Discontinued     750 mg 150 mL/hr over 60 Minutes Intravenous Every 12 hours 06/25/14 1512 06/26/14 1631   06/24/14 1800  levofloxacin (LEVAQUIN) IVPB 750 mg  Status:  Discontinued     750 mg 100 mL/hr over 90 Minutes Intravenous Every 48 hours 06/23/14 0023 06/24/14 1054   06/24/14 1800  vancomycin (VANCOCIN) IVPB 750 mg/150 ml premix  Status:  Discontinued     750 mg 150 mL/hr over 60 Minutes Intravenous Every 18 hours 06/24/14 1054 06/25/14 1512   06/24/14 1200  levofloxacin (LEVAQUIN) IVPB 500 mg  Status:  Discontinued     500 mg 100 mL/hr over 60 Minutes Intravenous Every 24 hours 06/24/14 1054 06/27/14 1312   06/23/14 0600  vancomycin (VANCOCIN) 500 mg in sodium chloride 0.9 % 100 mL IVPB  Status:  Discontinued     500 mg 100 mL/hr over 60 Minutes Intravenous Every 18 hours 06/22/14 2326 06/24/14 1054   06/22/14 2300  vancomycin (VANCOCIN) IVPB 1000 mg/200 mL premix  Status:  Discontinued     1,000 mg 200 mL/hr over 60 Minutes Intravenous  Once 06/22/14 2259 06/22/14 2309   06/22/14 2245  vancomycin (VANCOCIN) 500 mg in sodium chloride 0.9 % 100 mL IVPB     500 mg 100 mL/hr over 60 Minutes Intravenous STAT 06/22/14  2231 06/23/14 0039   06/22/14 2045  levofloxacin (LEVAQUIN) IVPB 750 mg     750 mg 100 mL/hr over 90 Minutes Intravenous  Once 06/22/14 2032 06/22/14 2246      Assessment/Plan:  Chest tube draining empyema of the left chest. Continue chest tube until Monday when Dr. Faith Rogue ease patient.  Florene Glen, MD, FACS  06/30/2014

## 2014-06-30 NOTE — Progress Notes (Signed)
[facility] [place] NAME: Rick Clark    MR#:  [id number]  DATE OF BIRTH:  January 11, 1946  SUBJECTIVE:  CHIEF COMPLAINT:   Chief Complaint  Patient presents with  . Pneumonia       Tolerated chest tube placement 06/25/14. Feels better now- last 24 hrs only had 30 ml drainage from chest tube, and now he is on nasal canula oxygen tapered to 1 ltr, feels much better- but have pain at tube insertion site. Now minimal fluids return from chest tube.  REVIEW OF SYSTEMS:  CONSTITUTIONAL: No fever,positive fatigue and weakness. Loosing weight. EYES: No blurred or double vision.  EARS, NOSE, AND THROAT: No tinnitus or ear pain.  RESPIRATORY: positive cough, less shortness of breath now , no wheezing or hemoptysis.  CARDIOVASCULAR: positive chest pain with cough , no orthopnea, edema.  GASTROINTESTINAL: No nausea, vomiting, diarrhea or abdominal pain.  GENITOURINARY: No dysuria, hematuria.  ENDOCRINE: No polyuria, nocturia,  HEMATOLOGY: No anemia, easy bruising or bleeding SKIN: No rash or lesion. MUSCULOSKELETAL: No joint pain or arthritis.   NEUROLOGIC: No tingling, numbness, weakness.  PSYCHIATRY: No anxiety or depression.   ROS  DRUG ALLERGIES:  No Known Allergies  VITALS:  Blood pressure 111/58, pulse 82, temperature 98 F (36.7 C), temperature source Oral, resp. rate 18, height '5\' 5"'$  (1.651 m), weight 36.877 kg (81 lb 4.8 oz), SpO2 92 %.  PHYSICAL EXAMINATION:  GENERAL:  69 y.o.-year-old patient lying in the bed with no acute distress. Severe malnourished. Thin. EYES: Pupils equal, round, reactive to light and accommodation. No scleral icterus. Extraocular muscles intact.  HEENT: Head atraumatic, normocephalic. Oropharynx and nasopharynx clear.  NECK:  Supple, no jugular venous distention. No thyroid enlargement, no tenderness.  LUNGS:  breath sounds bilaterally, no wheezing, positive for  crepitation. Decreased air entry b/l  lower lobes No use of accessory muscles of respiration. Left side chest tube in place. Now on nasal canula oxygen. CARDIOVASCULAR: S1, S2 normal. No murmurs, rubs, or gallops.  ABDOMEN: Soft, nontender, nondistended. Bowel sounds present. No organomegaly or mass.  EXTREMITIES: No pedal edema, cyanosis, or clubbing.  NEUROLOGIC: Cranial nerves II through XII are intact. Muscle strength 5/5 in all extremities. Sensation intact. Gait not checked.  PSYCHIATRIC: The patient is alert and oriented x 3.  SKIN: No obvious rash, lesion, or ulcer.   Physical Exam LABORATORY PANEL:   CBC  Recent Labs Lab 06/27/14 0548  WBC 14.5*  HGB 13.1  HCT 42.2  PLT 330   ------------------------------------------------------------------------------------------------------------------  Chemistries   Recent Labs Lab 06/27/14 0548  NA 137  K 3.7  CL 94*  CO2 36*  GLUCOSE 124*  BUN 16  CREATININE 0.61  CALCIUM 8.5*   ------------------------------------------------------------------------------------------------------------------  Cardiac Enzymes No results for input(s): TROPONINI in the last 168 hours. ------------------------------------------------------------------------------------------------------------------  RADIOLOGY:  Dg Chest 2 View  06/29/2014   CLINICAL DATA:  Pleural effusion, chest tube  EXAM: CHEST  2 VIEW  COMPARISON:  06/26/2014  FINDINGS: Cardiomediastinal silhouette is stable. Hyperinflation again noted. Residual bilateral small basilar infiltrates with improvement from prior exam. Stable left basilar pigtail pleural catheter. There is no pneumothorax. No pulmonary edema.  IMPRESSION: Hyperinflation again noted. Residual bilateral basilar infiltrates with improvement from prior exam. Stable left basilar pigtail pleural catheter. No pneumothorax.   Electronically Signed   By: Lahoma Crocker M.D.   On: 06/29/2014 08:20     ASSESSMENT AND PLAN:  This is a 69 year old Caucasian  male  admitted for pneumonia with parapneumonic effusion.  1. Pneumonia: Community-acquired, multilobar. Unclear if postobstructive but there is a concern for   malignancy as there is  left-sided hilar soft tissue fullness. The left-sided pleural effusion is loculated   initially was on vancomycin &  Levaquin   Consult Pulmonology appreciated- he called CT surgery for thoracentesis & Test pleural fluid.   The patient has a history of lung cancer and surgery, so suggested radiological drainage.  on 06/25/14- CT guided catheter placed, Dr. Genevive Bi did TPA for adhesion lysis on 06/26/14.   Fluid cytology and cx sent. So far cx negative.   Pt also had improvement, so vancomycin d/ced ( 06/27/14) and just continue levaquin.   As per surgery - still need to keep tube in for few days- repeat xray chest shows improvement.   Pt now had minimal drainage in last 24 hrs- may d/c tube soon- will let surgical team decide.   As per Dr. Burt Knack- will wait for Dr. Genevive Bi- and will get repeat xray chest.   Oxygen requirement is improving.  2. Tobacco abuse: NicoDerm patch, councelled to quit for 4 min.  3. Failure to thrive: The patient is moderately malnourished; admits to approximately 15 pounds of unintentional weight loss or 6 months.   suspected malignancy- but cytology from fluid is negative- I would suggest have a CT scan once pneumonia and effusion resolves.  4. DVT prophylaxis: Heparin 5. GI prophylaxis: None 6. Severe malnutrition- will give supplements .  All the records are reviewed and case discussed with Care Management/Social Workerr. Management plans discussed with the patient, family and they are in agreement.  CODE STATUS: full  TOTAL TIME TAKING CARE OF THIS PATIENT: 30 minutes.   POSSIBLE D/C IN 3-4 DAYS, DEPENDING ON CLINICAL CONDITION.   Rick Clark M.D on 06/30/2014   Between 7am to 6pm - Pager - [phone]  After 6pm go to [email or web address] - password [email or web address] [facility]  Office  [phone]  CC: Primary care physician; [name], MD

## 2014-06-30 NOTE — Plan of Care (Signed)
Problem: Discharge Progression Outcomes Goal: Other Discharge Outcomes/Goals Outcome: Progressing Plan of care progress to goal: Pt had Dilaudid for pain earlier. Pt stated recently that his pain has not been as bad to his left side near his chest tube. Pt voiding in the urinal without difficulty. Pt still has not had a bm. Pt abdomen distended with positive bowel sounds x 4. Pt given stool softener at bedtime. Pt declined further interventions stating " I want to sleep tonight".

## 2014-07-01 LAB — BASIC METABOLIC PANEL
Anion gap: 5 (ref 5–15)
BUN: 16 mg/dL (ref 6–20)
CHLORIDE: 95 mmol/L — AB (ref 101–111)
CO2: 31 mmol/L (ref 22–32)
CREATININE: 0.54 mg/dL — AB (ref 0.61–1.24)
Calcium: 8.6 mg/dL — ABNORMAL LOW (ref 8.9–10.3)
Glucose, Bld: 102 mg/dL — ABNORMAL HIGH (ref 65–99)
Potassium: 5 mmol/L (ref 3.5–5.1)
Sodium: 131 mmol/L — ABNORMAL LOW (ref 135–145)

## 2014-07-01 LAB — CBC
HEMATOCRIT: 41 % (ref 40.0–52.0)
Hemoglobin: 13.1 g/dL (ref 13.0–18.0)
MCH: 26.4 pg (ref 26.0–34.0)
MCHC: 31.9 g/dL — AB (ref 32.0–36.0)
MCV: 82.7 fL (ref 80.0–100.0)
PLATELETS: 491 10*3/uL — AB (ref 150–440)
RBC: 4.96 MIL/uL (ref 4.40–5.90)
RDW: 14.4 % (ref 11.5–14.5)
WBC: 15.3 10*3/uL — ABNORMAL HIGH (ref 3.8–10.6)

## 2014-07-01 MED ORDER — LEVOFLOXACIN 250 MG PO TABS
250.0000 mg | ORAL_TABLET | Freq: Every day | ORAL | Status: DC
  Administered 2014-07-01 – 2014-07-02 (×2): 250 mg via ORAL
  Filled 2014-07-01 (×2): qty 1

## 2014-07-01 NOTE — Progress Notes (Signed)
ANTIBIOTIC CONSULT NOTE - FOLLOW UP  Pharmacy Consult for Levaquin Indication: pneumonia  No Known Allergies  Patient Measurements: Height: '5\' 5"'$  (165.1 cm) Weight: 84 lb (38.102 kg) IBW/kg (Calculated) : 61.5  Vital Signs: Temp: 97.9 F (36.6 C) (06/19 0532) Temp Source: Oral (06/19 0532) BP: 100/57 mmHg (06/19 0532) Pulse Rate: 87 (06/19 0532)  Labs:  Recent Labs  07/01/14 0512  WBC 15.3*  HGB 13.1  PLT 491*  CREATININE 0.54*   Estimated Creatinine Clearance: 46.3 mL/min (by C-G formula based on Cr of 0.54).  Microbiology: Recent Results (from the past 720 hour(s))  Blood culture (routine x 2)     Status: None   Collection Time: 06/22/14  6:38 PM  Result Value Ref Range Status   Specimen Description BLOOD  Final   Special Requests Normal  Final   Culture NO GROWTH 5 DAYS  Final   Report Status 06/27/2014 FINAL  Final  Blood culture (routine x 2)     Status: None   Collection Time: 06/22/14  6:44 PM  Result Value Ref Range Status   Specimen Description BLOOD  Final   Special Requests Normal  Final   Culture NO GROWTH 5 DAYS  Final   Report Status 06/27/2014 FINAL  Final  Body fluid culture     Status: None   Collection Time: 06/25/14  2:30 PM  Result Value Ref Range Status   Specimen Description PLEURAL  Final   Special Requests NONE  Final   Gram Stain MODERATE WBC SEEN NO ORGANISMS SEEN   Final   Culture NO GROWTH 3 DAYS  Final   Report Status 06/28/2014 FINAL  Final    Medical History: Past Medical History  Diagnosis Date  . Lung cancer   . Pneumonia Nov '15    Anti-infectives    Start     Dose/Rate Route Frequency Ordered Stop   06/28/14 1000  levofloxacin (LEVAQUIN) tablet 500 mg     500 mg Oral Daily 06/27/14 1312     06/27/14 0000  vancomycin (VANCOCIN) IVPB 1000 mg/200 mL premix  Status:  Discontinued     1,000 mg 200 mL/hr over 60 Minutes Intravenous Every 8 hours 06/26/14 1632 06/27/14 1312   06/25/14 1600  vancomycin (VANCOCIN) IVPB  750 mg/150 ml premix  Status:  Discontinued     750 mg 150 mL/hr over 60 Minutes Intravenous Every 12 hours 06/25/14 1512 06/26/14 1631   06/24/14 1800  levofloxacin (LEVAQUIN) IVPB 750 mg  Status:  Discontinued     750 mg 100 mL/hr over 90 Minutes Intravenous Every 48 hours 06/23/14 0023 06/24/14 1054   06/24/14 1800  vancomycin (VANCOCIN) IVPB 750 mg/150 ml premix  Status:  Discontinued     750 mg 150 mL/hr over 60 Minutes Intravenous Every 18 hours 06/24/14 1054 06/25/14 1512   06/24/14 1200  levofloxacin (LEVAQUIN) IVPB 500 mg  Status:  Discontinued     500 mg 100 mL/hr over 60 Minutes Intravenous Every 24 hours 06/24/14 1054 06/27/14 1312   06/23/14 0600  vancomycin (VANCOCIN) 500 mg in sodium chloride 0.9 % 100 mL IVPB  Status:  Discontinued     500 mg 100 mL/hr over 60 Minutes Intravenous Every 18 hours 06/22/14 2326 06/24/14 1054   06/22/14 2300  vancomycin (VANCOCIN) IVPB 1000 mg/200 mL premix  Status:  Discontinued     1,000 mg 200 mL/hr over 60 Minutes Intravenous  Once 06/22/14 2259 06/22/14 2309   06/22/14 2245  vancomycin (VANCOCIN) 500 mg  in sodium chloride 0.9 % 100 mL IVPB     500 mg 100 mL/hr over 60 Minutes Intravenous STAT 06/22/14 2231 06/23/14 0039   06/22/14 2045  levofloxacin (LEVAQUIN) IVPB 750 mg     750 mg 100 mL/hr over 90 Minutes Intravenous  Once 06/22/14 2032 06/22/14 2246     Assessment: Patient with CAP and empyema ordered Levofloxacin. Patient is currently on day 9 of levofloxacin, currently '500mg'$  PO Q24hr. Patient previously received vancomycin (last dose 6/15).     Plan:   1. Creatinine clearance remains < 59m/min, will transition patient to levofloxacin '250mg'$  PO Q24hr.    Pharmacy will continue to monitor and adjust per consult.    SSkip Mayer PharmD 07/01/2014

## 2014-07-01 NOTE — Progress Notes (Signed)
[facility] at [place] NAME: Rick Clark    MR#:  [id number]  DATE OF BIRTH:  1945/02/01  SUBJECTIVE:  CHIEF COMPLAINT:   Chief Complaint  Patient presents with  . Pneumonia       Tolerated chest tube placement 06/25/14. Feels better now- last 24 hrs only had 30 ml drainage from chest tube,on room air now, feels much better- but have pain at tube insertion site. Now minimal fluids return from chest tube.  REVIEW OF SYSTEMS:  CONSTITUTIONAL: No fever,positive fatigue and weakness. Loosing weight. EYES: No blurred or double vision.  EARS, NOSE, AND THROAT: No tinnitus or ear pain.  RESPIRATORY: positive cough, less shortness of breath now , no wheezing or hemoptysis.  CARDIOVASCULAR: positive chest pain with cough , no orthopnea, edema.  GASTROINTESTINAL: No nausea, vomiting, diarrhea or abdominal pain.  GENITOURINARY: No dysuria, hematuria.  ENDOCRINE: No polyuria, nocturia,  HEMATOLOGY: No anemia, easy bruising or bleeding SKIN: No rash or lesion. MUSCULOSKELETAL: No joint pain or arthritis.   NEUROLOGIC: No tingling, numbness, weakness.  PSYCHIATRY: No anxiety or depression.   ROS  DRUG ALLERGIES:  No Known Allergies  VITALS:  Blood pressure 114/60, pulse 81, temperature 97.7 F (36.5 C), temperature source Oral, resp. rate 18, height '5\' 5"'$  (1.651 m), weight 38.102 kg (84 lb), SpO2 98 %.  PHYSICAL EXAMINATION:  GENERAL:  69 y.o.-year-old patient lying in the bed with no acute distress. Severe malnourished. Thin. EYES: Pupils equal, round, reactive to light and accommodation. No scleral icterus. Extraocular muscles intact.  HEENT: Head atraumatic, normocephalic. Oropharynx and nasopharynx clear.  NECK:  Supple, no jugular venous distention. No thyroid enlargement, no tenderness.  LUNGS:  breath sounds bilaterally, no wheezing, positive for  crepitation. Decreased air entry b/l lower lobes No use of accessory muscles of  respiration. Left side chest tube in place. Now on room air. CARDIOVASCULAR: S1, S2 normal. No murmurs, rubs, or gallops.  ABDOMEN: Soft, nontender, nondistended. Bowel sounds present. No organomegaly or mass.  EXTREMITIES: No pedal edema, cyanosis, or clubbing.  NEUROLOGIC: Cranial nerves II through XII are intact. Muscle strength 5/5 in all extremities. Sensation intact. Gait not checked.  PSYCHIATRIC: The patient is alert and oriented x 3.  SKIN: No obvious rash, lesion, or ulcer.   Physical Exam LABORATORY PANEL:   CBC  Recent Labs Lab 07/01/14 0512  WBC 15.3*  HGB 13.1  HCT 41.0  PLT 491*   ------------------------------------------------------------------------------------------------------------------  Chemistries   Recent Labs Lab 07/01/14 0512  NA 131*  K 5.0  CL 95*  CO2 31  GLUCOSE 102*  BUN 16  CREATININE 0.54*  CALCIUM 8.6*   ------------------------------------------------------------------------------------------------------------------  Cardiac Enzymes No results for input(s): TROPONINI in the last 168 hours. ------------------------------------------------------------------------------------------------------------------  RADIOLOGY:  No results found.   ASSESSMENT AND PLAN:  This is a 69 year old Caucasian male admitted for pneumonia with parapneumonic effusion.  1. Pneumonia: Community-acquired, multilobar. Unclear if postobstructive but there is a concern for   malignancy as there is  left-sided hilar soft tissue fullness. The left-sided pleural effusion is loculated   initially was on vancomycin &  Levaquin   Consult Pulmonology appreciated- he called CT surgery for thoracentesis & Test pleural fluid.   The patient has a history of lung cancer and surgery, so suggested radiological drainage.  on 06/25/14- CT guided catheter placed, Dr. Genevive Bi did TPA for adhesion lysis on 06/26/14.   Fluid cytology and cx sent. So far cx negative.  Pt also had  improvement, so vancomycin d/ced ( 06/27/14) and just continue levaquin.   As per surgery - still need to keep tube in for few days- repeat xray chest shows improvement.   Pt now had minimal drainage in last 24 hrs- may d/c tube soon- will let surgical team decide.     Oxygen requirement is improving.   Likely chest tube will be removed on Monday by Dr. Genevive Bi.   Pt may need PT eval after tube removal- due to long stay.  2. Tobacco abuse: NicoDerm patch, councelled to quit for 4 min.  3. Failure to thrive: The patient is moderately malnourished; admits to approximately 15 pounds of unintentional weight loss or 6 months.   suspected malignancy- but cytology from fluid is negative- I would suggest have a CT scan once pneumonia and effusion resolves.  4. DVT prophylaxis: Heparin 5. GI prophylaxis: None 6. Severe malnutrition- will give supplements .  May need PT eval.  All the records are reviewed and case discussed with Care Management/Social Workerr. Management plans discussed with the patient, family and they are in agreement.  CODE STATUS: full  TOTAL TIME TAKING CARE OF THIS PATIENT: 30 minutes.   POSSIBLE D/C IN 3-4 DAYS, DEPENDING ON CLINICAL CONDITION.   Vaughan Basta M.D on 07/01/2014   Between 7am to 6pm - Pager - [phone]  After 6pm go to [email or web address] - password [email or web address] [facility]  Office  [phone]  CC: Primary care physician; [name], MD

## 2014-07-01 NOTE — Plan of Care (Signed)
Problem: Discharge Progression Outcomes Goal: Other Discharge Outcomes/Goals Outcome: Progressing Plan of care progress to goals: 1. Pain:      Dilaudid x 1. Percocet 2 tablets x 2 doses given. Left ribcage chest tube site pain. Noted relief. 2. Hemodynamically Stable:        Afebrile.        O2 sat 100% on RA.       No sob noted.      Scheduled aerosol treatments.        Left chest tube in place with minimal drainage.  3. Diet:      Tolerating regular diet.      Tolerated Magic Cup and Ensure Supplement. 4. Activity:      Repositions in bed.         No fall/injury this shift.

## 2014-07-01 NOTE — Plan of Care (Signed)
Problem: Discharge Progression Outcomes Goal: Other Discharge Outcomes/Goals Outcome: Progressing Plan of care progress to goals: 1. C/o pain at chest tube insertion site relieved by Dilaudid x2.   2. Hemodynamically:                -VSS, afebrile. Stable O2 sats stable on room air.             -Left chest tube in place with decreased drainage.   3. Tolerating diet continues ensure 4.No fall/injury this shift. Will continue to assess.

## 2014-07-02 ENCOUNTER — Inpatient Hospital Stay: Payer: PPO

## 2014-07-02 DIAGNOSIS — J90 Pleural effusion, not elsewhere classified: Secondary | ICD-10-CM

## 2014-07-02 MED ORDER — LEVOFLOXACIN 250 MG PO TABS: 250.0000 mg | ORAL_TABLET | Freq: Every day | ORAL | Status: DC

## 2014-07-02 NOTE — Plan of Care (Signed)
Problem: Discharge Progression Outcomes Goal: Other Discharge Outcomes/Goals Plan of care progress to goal: - Complained of pain, PRN meds given with improvement. - Chest tube in place. - Uses urinal at bedside, will continue to monitor.

## 2014-07-02 NOTE — Progress Notes (Signed)
   07/02/14 0950  Clinical Encounter Type  Visited With Patient  Visit Type Initial  Consult/Referral To Chaplain  Provided pastoral presence and support to patient.  Patient told me he is "feeling good." He hopes to be discharged today as soon as the doctor sees him.  Told me his family had been to see him the other day, but had not been in yet today.  He said he appreciated my visit and thanked me for coming.  [place] [phone]

## 2014-07-02 NOTE — Clinical Social Work Note (Signed)
Late entry 07/02/14 for 07/01/14  Clinical Social Work Assessment  Patient Details  Name: Rick Clark MRN: [id number] Date of Birth: 1945-05-22  Date of referral:  06/29/14               Reason for consult:  Discharge Planning                Permission sought to share information with:  Family Supports Permission granted to share information::  Yes, Verbal Permission Granted  Name::   Chief Executive Officer::     Relationship::  daughter, granddaughter  Sport and exercise psychologist Information:     Housing/Transportation Living arrangements for the past 2 months:  Single Family Home Source of Information:    Patient Interpreter Needed:    Criminal Activity/Legal Involvement Pertinent to Current Situation/Hospitalization:  No - Comment as needed Significant Relationships:  Adult Children, Other Family Members Lives with:    Do you feel safe going back to the place where you live?  Yes Need for family participation in patient care:  Yes (Comment) (Pt will require extensive support and assistance from family at home)  Care giving concerns: Pt will require assistance with ADL's at dc. PT eval pending due to chest tube needing to come out prior to eval on Monday.    Social Worker assessment / plan:  CSW met with Pt while his family was at bedside. CSW and family discussed Pt's dc plans knowing that Pt is severely deconditioned and is probably declined in mobility as well due to not getting out of bed. Pt's family is beside him in agreement that he will likely need 1 to 2 people to help him move at dc. Pt's granddaughter and her boyfriend state that they will be there to help Pt. Pt's daughter also at bedside in agreement with Pt's granddaughter taking care of Pt. Pt was working up until this admission and states that he would have a hard time going to a facility even if it was recommended for therapy.  CSW will follow up with RN CM regarding dc plans likely home.   Employment status:  Museum/gallery curator:    PT Recommendations:  Not assessed at this time Information / Referral to community resources:  [address]  Patient/Family's Response to care: Pt and his family are pleased with Pt's care, though concerned that he has not been out of bed and wondering what he will need once he starts moving. Family is very supportive of Pt. Pt is pleasant and motivated to recovering at home.   Patient/Family's Understanding of and Emotional Response to Diagnosis, Current Treatment, and Prognosis:  Pt and his family are optimistic for Pt's recovery.   Emotional Assessment Appearance:  Appears older than stated age, Other (Comment Required (malnurished) Attitude/Demeanor/Rapport:   (pleasant) Affect (typically observed):  Accepting, Happy, Hopeful Orientation:  Oriented to Self, Oriented to Place, Oriented to  Time, Oriented to Situation Alcohol / Substance use:  Tobacco Use Psych involvement (Current and /or in the community):  No (Comment)  Discharge Needs  Concerns to be addressed:  Discharge Planning Concerns Readmission within the last 30 days:  No Current discharge risk:  Dependent with Mobility Barriers to Discharge:      Alonna Buckler, LCSW 07/02/2014, 2:22 PM

## 2014-07-02 NOTE — Care Management (Addendum)
Physical therapy evaluation completed. Recommends home with home health and physical therapy. Home oxygen.  Discussed home health agencies. [place] for home health services and oxygen. Oxygen saturations 76% room air and rest, 07/02/14 3:40pm. Will Ouida Sills, representative for [facility] updated for home oxygen, [place] updated for Home Health services.  Discharge to home today per Dr. Darvin Neighbours. Family will transport. Shelbie Ammons RN MSN Care Management [phone]

## 2014-07-02 NOTE — Discharge Instructions (Addendum)
°  DIET:  Regular diet  DISCHARGE CONDITION:  Stable  ACTIVITY:  Activity as tolerated  OXYGEN:  Home Oxygen: YES   Oxygen Delivery: 2 Liters/Min via Nasal canula  DISCHARGE LOCATION:  home   If you experience worsening of your admission symptoms, develop shortness of breath, life threatening emergency, suicidal or homicidal thoughts you must seek medical attention immediately by calling 911 or calling your MD immediately  if symptoms less severe.  You Must read complete instructions/literature along with all the possible adverse reactions/side effects for all the Medicines you take and that have been prescribed to you. Take any new Medicines after you have completely understood and accpet all the possible adverse reactions/side effects.   Please note  You were cared for by a hospitalist during your hospital stay. If you have any questions about your discharge medications or the care you received while you were in the hospital after you are discharged, you can call the unit and asked to speak with the hospitalist on call if the hospitalist that took care of you is not available. Once you are discharged, your primary care physician will handle any further medical issues. Please note that NO REFILLS for any discharge medications will be authorized once you are discharged, as it is imperative that you return to your primary care physician (or establish a relationship with a primary care physician if you do not have one) for your aftercare needs so that they can reassess your need for medications and monitor your lab values.

## 2014-07-02 NOTE — Progress Notes (Signed)
Dr. Genevive Bi at bedside. Chest tube removed without complications. Dressing applied. PT consult ordered per verbal order from Dr. Genevive Bi. Per PT recommendation patient to go home with PT and O2. Hassan Rowan, case manager made aware. Will at [place] made aware. MD making rounds. Discharge orders received. Will at bedside with home O2 tank. IV removed. Prescriptions given to patient. Discharge paperwork provided, explained, signed and witnessed. No unanswered questions. Discharged via wheelchair by nursing staff. Belongings sent with patient and family.

## 2014-07-02 NOTE — Progress Notes (Signed)
Patient ID: Rick Clark, male   DOB: January 11, 1946, 69 y.o.   MRN: [id number]  HISTORY: No new problems are voiced. He states that his breathing is better. He denied any fevers or chills.   PERTINENT REVIEW OF SYSTEMS: No fevers or chills. He does have a small amount of pain at the chest tube entry site. His appetite is improving.  Filed Vitals:   07/02/14 0749  BP: 103/52  Pulse: 88  Temp: 97.5 F (36.4 C)  Resp:    Wt Readings from Last 3 Encounters:  07/02/14 83 lb 6.4 oz (37.83 kg)    EXAM:  Resp: Lungs are clear bilaterally but very distant.  No respiratory distress, normal effort. Heart:  Regular without murmurs Skin: Skin is warm and dry. No rash noted. No diaphoretic. No erythema. No pallor. CT site is clean and dry without erythema Psychiatric: Normal mood and affect. Normal behavior. Judgment and thought content normal.      ASSESSMENT: I have independently reviewed the patient's chest x-ray from today. There is a very small left-sided pleural effusion but no pneumothorax. All cultures are negative. In addition his chest tube has drained only minimal amounts over the last several days. I believe that the chest tube can be removed.   PLAN:   I have removed the chest tube today without difficulty. Sterile dressings were applied. I would recommend that I see the patient back in the office within the next 2 weeks with a repeat chest x-ray. From my standpoint he is capable of going home at this time.    [name], MD

## 2014-07-02 NOTE — Evaluation (Signed)
Physical Therapy Evaluation Patient Details Name: Rick Clark MRN: [id number] DOB: [birth year] Today's Date: 07/02/2014   History of Present Illness  presented to ER with progressive cough, progressive weakness x2 days; admitted with multilobar PNA, L pleural effusion, R pleural thickening and concern for empyema.  L chest tube placed 6/13 and removed 6/20 (site clean and dry).  Currently on RA.  Clinical Impression  Upon evaluation, patient alert and oriented, follows all commands; eager for participation/progress with mobility.  Bilat UE/LE strength and ROM grossly WFL and symmetrical; no focal weakness, pain or sensory deficit noted.  Negative orthostatics with transition to upright.  Able to complete bed mobility indep; sit/stand, basic transfers and gait (100') with RW, min assist.  Generally unsteady and unsafe to complete without assist. Does improve to cga/close sup with use of RW for additional gait trials. Patient subjectively reporting optimal comfort/confidence with use of RW as well.  Recommend continued use of RW with all mobility at this time.  Patient/family voiced understanding and agreement.  Does desat to 86% on RA with exertion, requiring seated rest period and 2-3 min for recovery.  Second trial completed on 2L of O2 with patient still desat to 87%, but recovers immediately with cessation of activity.  May benefit from home O2 with exertion.  Care management informed/aware. Would benefit from skilled PT to address above deficits and promote optimal return to PLOF;Recommend transition to [facility] upon discharge from acute hospitalization. Patient/family aware of recommendations and in agreement with plan.     Follow Up Recommendations Home health PT    Equipment Recommendations  Rolling walker with 5" wheels (may be candidate for home O2)    Recommendations for Other Services       Precautions / Restrictions Precautions Precautions: Fall Restrictions Weight Bearing  Restrictions: No      Mobility  Bed Mobility Overal bed mobility: Independent                Transfers Overall transfer level: Needs assistance Equipment used: Rolling walker (2 wheeled) Transfers: Sit to/from Stand Sit to Stand: Min assist            Ambulation/Gait Ambulation/Gait assistance: Min assist Ambulation Distance (Feet): 100 Feet Assistive device: 1 person hand held assist       General Gait Details: narrowed BOS with LEs occasionally crossing midline; min assist from therapist for recovery.  Stairs            Wheelchair Mobility    Modified Rankin (Stroke Patients Only)       Balance Overall balance assessment: Needs assistance Sitting-balance support: No upper extremity supported;Feet supported Sitting balance-Leahy Scale: Normal     Standing balance support: No upper extremity supported Standing balance-Leahy Scale: Fair Standing balance comment: tendancy towards posterior LOB                             Pertinent Vitals/Pain Pain Assessment: No/denies pain    Home Living Family/patient expects to be discharged to:: Private residence Living Arrangements: Other relatives (granddaughter; able to provide 24 hour sup/assist) Available Help at Discharge: Family Type of Home: House Home Access: Stairs to enter Entrance Stairs-Rails: None Entrance Stairs-Number of Steps: 2 Home Layout: One level Home Equipment: None      Prior Function Level of Independence: Independent         Comments: Indep with all household/community activities; working full-time in [employer]  Extremity/Trunk Assessment   Upper Extremity Assessment: Overall WFL for tasks assessed           Lower Extremity Assessment: Overall WFL for tasks assessed         Communication   Communication: No difficulties  Cognition Arousal/Alertness: Awake/alert Behavior During Therapy: WFL for tasks  assessed/performed Overall Cognitive Status: Within Functional Limits for tasks assessed                      General Comments General comments (skin integrity, edema, etc.): L chest tube site clean and dry, covered with gauze/tegaderm    Exercises Other Exercises Other Exercises: Additional gait trial x2 with RW, cga/close sup--safety, stability and overall performance progressively improved with use of RW.  Does desat to 86% on RA with exertion, requiring seated rest period and 2-3 min for recovery.  Second trial completed on 2L of O2 with patient still desat to 87%, but recovers immediately with cessation of activity. Other Exercises: Educated on need for activity pacing and energy conservation to maximize oxygenation with mobility; patient/family voiced understanding. (10 minutes)      Assessment/Plan    PT Assessment Patient needs continued PT services  PT Diagnosis Difficulty walking;Generalized weakness   PT Problem List Decreased activity tolerance;Decreased balance;Decreased mobility;Decreased knowledge of precautions;Cardiopulmonary status limiting activity  PT Treatment Interventions DME instruction;Gait training;Stair training;Functional mobility training;Therapeutic activities;Therapeutic exercise;Patient/family education;Balance training   PT Goals (Current goals can be found in the Care Plan section) Acute Rehab PT Goals Patient Stated Goal: "to get out of here" PT Goal Formulation: With patient/family Time For Goal Achievement: 07/16/14 Potential to Achieve Goals: Good Additional Goals Additional Goal #1: Indep demonstration of activity pacing and energy conservation techniques for adequate oxygenation with functional activities.    Frequency Min 2X/week   Barriers to discharge        Co-evaluation               End of Session Equipment Utilized During Treatment: Gait belt Activity Tolerance: Patient tolerated treatment well Patient left: in  bed;with call bell/phone within reach;with bed alarm set;with family/visitor present Nurse Communication: Mobility status (O2 response to activity)         Time: 1478-2956 PT Time Calculation (min) (ACUTE ONLY): 40 min   Charges:   PT Evaluation $Initial PT Evaluation Tier I: 1 Procedure PT Treatments $Gait Training: 8-22 mins   PT G Codes:        Rick Clark, PT, DPT, NCS 07/02/2014, 3:26 PM [phone]

## 2014-07-02 NOTE — Progress Notes (Signed)
Dr. Genevive Bi at bedside. Chest tube removed without complications. Dressing applied. PT consult ordered per verbal order from Dr. Genevive Bi. Per PT recommendation patient to go home with PT and O2. Hassan Rowan, case manager made aware. Will at [facility] made aware. MD making rounds. Discharge orders received. Will at bedside with home O2 tank. IV removed. Prescriptions given to patient. Discharge paperwork provided, explained, signed and witnessed. No unanswered questions. Discharged via wheelchair by nursing staff. Belongings sent with patient and family.

## 2014-07-04 NOTE — Discharge Summary (Signed)
[facility] NAME: Rick Clark    MR#:  [id number]  DATE OF BIRTH:  May 20, 1945  DATE OF ADMISSION:  06/22/2014 ADMITTING PHYSICIAN: Harrie Foreman, MD  DATE OF DISCHARGE: 07/02/2014  3:29 PM  PRIMARY CARE PHYSICIAN: [name], MD    ADMISSION DIAGNOSIS:  Left lower lobe pneumonia [J18.9] Rib pain on left side [R07.81]  DISCHARGE DIAGNOSIS:  Principal Problem:   Pneumonia Active Problems:   Parapneumonic effusion   Failure to thrive in adult   Protein-calorie malnutrition, severe   SECONDARY DIAGNOSIS:   Past Medical History  Diagnosis Date  . Lung cancer   . Pneumonia Nov '15     ADMITTING HISTORY  Chief Complaint: Cough HPI: The patient presents emergency department complaining of 2 days of cough and progressive weakness. The cough has been nonproductive. The patient denies fevers or chills but states that he barely had the strength to pick up a cup of coffee. In the emergency department the patient underwent a CT of the chest which showed multilobar pneumonia as well as a left sided pleural effusion. There is some right pleural thickening as well concerning for possible malignancy. Due to the patient's age and possible empyema emergency department staff called for admission   HOSPITAL COURSE:   This is a 69 year old Caucasian male admitted for pneumonia with parapneumonic effusion.  1. Pneumonia: Community-acquired, multilobar. Unclear if postobstructive but there was a concern for malignancy as there is left-sided hilar soft tissue fullness. The left-sided pleural effusion is loculated  initially was on vancomycin & Levaquin  Consult Pulmonology appreciated- he called CT surgery for thoracentesis & Test pleural fluid.  The patient has a history of lung cancer and surgery, so suggested radiological drainage. on 06/25/14- CT guided catheter placed, Dr. Genevive Bi did TPA for adhesion lysis on  06/26/14.  Fluid cytology and cx sent. So far cx negative.  Pt also had improvement, so vancomycin d/ced ( 06/27/14) and just continue levaquin. Chest tube removed by Dr. Genevive Bi and patient discharged home in stable condition with Home o2 and Home health PT.  Follow up with Pulm and thoracic surgery in 1-2 weeks CONSULTS OBTAINED:  Treatment Team:  Allyne Gee, MD [name], MD [name], MD  DRUG ALLERGIES:  No Known Allergies  DISCHARGE MEDICATIONS:   Discharge Medication List as of 07/02/2014  5:11 PM    START taking these medications   Details  levofloxacin (LEVAQUIN) 250 MG tablet Take 1 tablet (250 mg total) by mouth daily., Starting 07/02/2014, Until Discontinued, Normal      CONTINUE these medications which have NOT CHANGED   Details  albuterol (PROVENTIL HFA;VENTOLIN HFA) 108 (90 BASE) MCG/ACT inhaler Inhale 1 puff into the lungs every 6 (six) hours as needed for wheezing or shortness of breath., Until Discontinued, Historical Med    budesonide-formoterol (SYMBICORT) 160-4.5 MCG/ACT inhaler Inhale 2 puffs into the lungs 2 (two) times daily., Until Discontinued, Historical Med    ibuprofen (ADVIL,MOTRIN) 200 MG tablet Take 400 mg by mouth every 6 (six) hours as needed for headache or mild pain., Until Discontinued, Historical Med    tiotropium (SPIRIVA) 18 MCG inhalation capsule Place 18 mcg into inhaler and inhale daily., Until Discontinued, Historical Med        Today    VITAL SIGNS:  Blood pressure 104/52, pulse 89, temperature 97.6 F (36.4 C), temperature source Oral, resp. rate 18, height '5\' 5"'$  (1.651 m), weight 37.83 kg (83  lb 6.4 oz), SpO2 95 %.  I/O:  No intake or output data in the 24 hours ending 07/04/14 1319  PHYSICAL EXAMINATION:  Physical Exam  GENERAL:  69 y.o.-year-old patient lying in the bed with no acute distress.  LUNGS: Normal breath sounds bilaterally, no wheezing, rales,rhonchi or crepitation. No use of accessory muscles of  respiration.  CARDIOVASCULAR: S1, S2 normal. No murmurs, rubs, or gallops.  ABDOMEN: Soft, non-tender, non-distended. Bowel sounds present. No organomegaly or mass.  NEUROLOGIC: Moves all 4 extremities. PSYCHIATRIC: The patient is alert and oriented x 3.  SKIN: No obvious rash, lesion, or ulcer.   DATA REVIEW:   CBC  Recent Labs Lab 07/01/14 0512  WBC 15.3*  HGB 13.1  HCT 41.0  PLT 491*    Chemistries   Recent Labs Lab 07/01/14 0512  NA 131*  K 5.0  CL 95*  CO2 31  GLUCOSE 102*  BUN 16  CREATININE 0.54*  CALCIUM 8.6*    Cardiac Enzymes No results for input(s): TROPONINI in the last 168 hours.  Microbiology Results  Results for orders placed or performed during the hospital encounter of 06/22/14  Blood culture (routine x 2)     Status: None   Collection Time: 06/22/14  6:38 PM  Result Value Ref Range Status   Specimen Description BLOOD  Final   Special Requests Normal  Final   Culture NO GROWTH 5 DAYS  Final   Report Status 06/27/2014 FINAL  Final  Blood culture (routine x 2)     Status: None   Collection Time: 06/22/14  6:44 PM  Result Value Ref Range Status   Specimen Description BLOOD  Final   Special Requests Normal  Final   Culture NO GROWTH 5 DAYS  Final   Report Status 06/27/2014 FINAL  Final  Body fluid culture     Status: None   Collection Time: 06/25/14  2:30 PM  Result Value Ref Range Status   Specimen Description PLEURAL  Final   Special Requests NONE  Final   Gram Stain MODERATE WBC SEEN NO ORGANISMS SEEN   Final   Culture NO GROWTH 3 DAYS  Final   Report Status 06/28/2014 FINAL  Final    RADIOLOGY:  No results found.    Follow up with PCP in 1 week.  Management plans discussed with the patient, family and they are in agreement.  CODE STATUS:   TOTAL TIME TAKING CARE OF THIS PATIENT ON DAY OF DISCHARGE: more than 30  minutes.    [name] R M.D on 07/04/2014 at 1:19 PM  Between 7am to 6pm - Pager - [phone]  After  6pm go to [email or web address] - password [email or web address] [facility]  Office  [phone]  CC: Primary care physician; [name], MD

## 2014-07-12 ENCOUNTER — Inpatient Hospital Stay: Payer: PPO | Attending: Cardiothoracic Surgery | Admitting: Cardiothoracic Surgery

## 2014-07-12 ENCOUNTER — Ambulatory Visit
Admission: RE | Admit: 2014-07-12 | Discharge: 2014-07-12 | Disposition: A | Discharge status: Home / Self Care | Payer: PPO | Source: Ambulatory Visit | Attending: Cardiothoracic Surgery | Admitting: Cardiothoracic Surgery

## 2014-07-12 ENCOUNTER — Encounter: Payer: Self-pay | Admitting: Cardiothoracic Surgery

## 2014-07-12 VITALS — BP 98/59 | HR 80 | Temp 95.8°F | Resp 20 | Ht 65.0 in | Wt 84.7 lb

## 2014-07-12 DIAGNOSIS — Z9889 Other specified postprocedural states: Secondary | ICD-10-CM | POA: Insufficient documentation

## 2014-07-12 DIAGNOSIS — J9383 Other pneumothorax: Secondary | ICD-10-CM

## 2014-07-12 DIAGNOSIS — J189 Pneumonia, unspecified organism: Secondary | ICD-10-CM | POA: Insufficient documentation

## 2014-07-12 DIAGNOSIS — Z85118 Personal history of other malignant neoplasm of bronchus and lung: Secondary | ICD-10-CM | POA: Diagnosis not present

## 2014-07-12 DIAGNOSIS — J939 Pneumothorax, unspecified: Secondary | ICD-10-CM | POA: Insufficient documentation

## 2014-07-12 DIAGNOSIS — J948 Other specified pleural conditions: Secondary | ICD-10-CM | POA: Diagnosis not present

## 2014-07-12 DIAGNOSIS — R918 Other nonspecific abnormal finding of lung field: Secondary | ICD-10-CM | POA: Insufficient documentation

## 2014-07-12 NOTE — Progress Notes (Signed)
Rick Clark Inpatient Post-Op Note  Patient ID: Rick Clark, male   DOB: 04-06-1945, 69 y.o.   MRN: [id number]  HISTORY: This patient returns today in follow-up. He was recently admitted to the hospital with a left-sided pneumothorax. A small chest tube was inserted at that time. He does not complain of any significant shortness of breath. He states that he has not had any cough fevers chills or shortness of breath. He would like to return to work. He does do some heavy working activities.   Filed Vitals:   07/12/14 0932  BP: 98/59  Pulse: 80  Temp: 95.8 F (35.4 C)  Resp: 20     EXAM: Resp: Lungs are clear bilaterally.  No respiratory distress, normal effort. Heart:  Regular without murmurs Abd:  Abdomen is soft, non distended and non tender. No masses are palpable.  There is no rebound and no guarding.  Neurological: Alert and oriented to person, place, and time. Coordination normal.  Skin: Skin is warm and dry. No rash noted. No diaphoretic. No erythema. No pallor.  Psychiatric: Normal mood and affect. Normal behavior. Judgment and thought content normal.    ASSESSMENT: Left-sided pneumothorax. I have independently reviewed the patient's chest x-ray from today. I do not see any signs of pneumothorax or pleural effusion.   PLAN:   This patient may return to work as he would like. There are no limitations at this time based upon his recent pneumothorax. He will come back to see [place] as needed.    [name], MD

## 2014-08-27 ENCOUNTER — Ambulatory Visit
Admission: RE | Admit: 2014-08-27 | Discharge: 2014-08-27 | Disposition: A | Discharge status: Home / Self Care | Payer: PPO | Source: Ambulatory Visit | Attending: Internal Medicine | Admitting: Internal Medicine

## 2014-08-27 ENCOUNTER — Other Ambulatory Visit: Payer: Self-pay | Admitting: Internal Medicine

## 2014-08-27 DIAGNOSIS — R05 Cough: Secondary | ICD-10-CM

## 2014-08-27 DIAGNOSIS — R059 Cough, unspecified: Secondary | ICD-10-CM

## 2014-08-27 DIAGNOSIS — J449 Chronic obstructive pulmonary disease, unspecified: Secondary | ICD-10-CM | POA: Insufficient documentation

## 2014-08-27 DIAGNOSIS — J90 Pleural effusion, not elsewhere classified: Secondary | ICD-10-CM | POA: Insufficient documentation

## 2014-08-30 ENCOUNTER — Other Ambulatory Visit: Payer: Self-pay | Admitting: Physician Assistant

## 2014-08-30 DIAGNOSIS — D381 Neoplasm of uncertain behavior of trachea, bronchus and lung: Secondary | ICD-10-CM

## 2014-09-03 ENCOUNTER — Ambulatory Visit
Admission: RE | Admit: 2014-09-03 | Discharge: 2014-09-03 | Disposition: A | Discharge status: Home / Self Care | Payer: PPO | Source: Ambulatory Visit | Attending: Physician Assistant | Admitting: Physician Assistant

## 2014-09-03 DIAGNOSIS — D381 Neoplasm of uncertain behavior of trachea, bronchus and lung: Secondary | ICD-10-CM | POA: Diagnosis not present

## 2014-09-03 DIAGNOSIS — R918 Other nonspecific abnormal finding of lung field: Secondary | ICD-10-CM | POA: Diagnosis not present

## 2014-09-03 MED ORDER — IOHEXOL 300 MG/ML  SOLN
75.0000 mL | Freq: Once | INTRAMUSCULAR | Status: AC | PRN
  Administered 2014-09-03: 75 mL via INTRAVENOUS

## 2014-09-18 ENCOUNTER — Encounter
Admission: RE | Disposition: A | Discharge status: Home / Self Care | Payer: Self-pay | Source: Ambulatory Visit | Attending: Internal Medicine

## 2014-09-18 ENCOUNTER — Encounter: Payer: Self-pay | Admitting: *Deleted

## 2014-09-18 ENCOUNTER — Ambulatory Visit
Admission: RE | Admit: 2014-09-18 | Discharge: 2014-09-18 | Disposition: A | Discharge status: Home / Self Care | Payer: PPO | Source: Ambulatory Visit | Attending: Internal Medicine | Admitting: Internal Medicine

## 2014-09-18 DIAGNOSIS — Z902 Acquired absence of lung [part of]: Secondary | ICD-10-CM | POA: Diagnosis not present

## 2014-09-18 DIAGNOSIS — J449 Chronic obstructive pulmonary disease, unspecified: Secondary | ICD-10-CM | POA: Insufficient documentation

## 2014-09-18 DIAGNOSIS — Z85118 Personal history of other malignant neoplasm of bronchus and lung: Secondary | ICD-10-CM | POA: Diagnosis present

## 2014-09-18 DIAGNOSIS — Z7951 Long term (current) use of inhaled steroids: Secondary | ICD-10-CM | POA: Diagnosis not present

## 2014-09-18 DIAGNOSIS — R05 Cough: Secondary | ICD-10-CM | POA: Insufficient documentation

## 2014-09-18 DIAGNOSIS — F17210 Nicotine dependence, cigarettes, uncomplicated: Secondary | ICD-10-CM | POA: Insufficient documentation

## 2014-09-18 DIAGNOSIS — A498 Other bacterial infections of unspecified site: Secondary | ICD-10-CM | POA: Diagnosis not present

## 2014-09-18 DIAGNOSIS — Z79899 Other long term (current) drug therapy: Secondary | ICD-10-CM | POA: Insufficient documentation

## 2014-09-18 HISTORY — DX: Chronic obstructive pulmonary disease, unspecified: J44.9

## 2014-09-18 HISTORY — PX: FLEXIBLE BRONCHOSCOPY: SHX5094

## 2014-09-18 SURGERY — BRONCHOSCOPY, FLEXIBLE
Anesthesia: Moderate Sedation | Laterality: Bilateral

## 2014-09-18 MED ORDER — PHENYLEPHRINE HCL 0.25 % NA SOLN: 1.0000 | Freq: Four times a day (QID) | NASAL | Status: DC | PRN

## 2014-09-18 MED ORDER — BUTAMBEN-TETRACAINE-BENZOCAINE 2-2-14 % EX AERO: 1.0000 | INHALATION_SPRAY | Freq: Once | CUTANEOUS | Status: DC

## 2014-09-18 MED ORDER — LIDOCAINE HCL 2 % EX GEL: 1.0000 "application " | Freq: Once | CUTANEOUS | Status: DC

## 2014-09-18 MED ORDER — FENTANYL CITRATE (PF) 100 MCG/2ML IJ SOLN
INTRAMUSCULAR | Status: AC
  Filled 2014-09-18: qty 4

## 2014-09-18 MED ORDER — MIDAZOLAM HCL 5 MG/5ML IJ SOLN
INTRAMUSCULAR | Status: AC | PRN
  Administered 2014-09-18 (×2): 1 mg via INTRAVENOUS

## 2014-09-18 MED ORDER — FENTANYL CITRATE (PF) 100 MCG/2ML IJ SOLN
INTRAMUSCULAR | Status: AC | PRN
  Administered 2014-09-18 (×2): 50 ug via INTRAVENOUS

## 2014-09-18 MED ORDER — MIDAZOLAM HCL 5 MG/5ML IJ SOLN
INTRAMUSCULAR | Status: AC
  Filled 2014-09-18: qty 10

## 2014-09-18 MED ORDER — SODIUM CHLORIDE 0.9 % IV SOLN: Freq: Once | INTRAVENOUS | Status: DC

## 2014-09-18 MED ORDER — SODIUM CHLORIDE 0.9 % IV SOLN
Freq: Once | INTRAVENOUS | Status: AC
  Administered 2014-09-18 (×2): via INTRAVENOUS

## 2014-09-18 NOTE — Discharge Instructions (Signed)
Flexible Bronchoscopy, Care After Refer to this sheet in the next few weeks. These instructions provide you with information on caring for yourself after your procedure. Your health care provider may also give you more specific instructions. Your treatment has been planned according to current medical practices, but problems sometimes occur. Call your health care provider if you have any problems or questions after your procedure.  WHAT TO EXPECT AFTER THE PROCEDURE It is normal to have the following symptoms for 24-48 hours after the procedure:   Increased cough.  Low-grade fever.  Sore throat or hoarse voice.  Small streaks of blood in your thick spit (sputum) if tissue samples were taken (biopsy). HOME CARE INSTRUCTIONS   Do not eat or drink anything for 2 hours after your procedure. Your nose and throat were numbed by medicine. If you try to eat or drink before the medicine wears off, food or drink could go into your lungs or you could burn yourself. After the numbness is gone and your cough and gag reflexes have returned, you may eat soft food and drink liquids slowly.   The day after the procedure, you can go back to your normal diet.   You may resume normal activities.   Keep all follow-up visits as directed by your health care provider. It is important to keep all your appointments, especially if tissue samples were taken for testing (biopsy). SEEK IMMEDIATE MEDICAL CARE IF:   You have increasing shortness of breath.   You become light-headed or faint.   You have chest pain.   You have any new concerning symptoms.  You cough up more than a small amount of blood.  The amount of blood you cough up increases. MAKE SURE YOU:  Understand these instructions.  Will watch your condition.  Will get help right away if you are not doing well or get worse. Document Released: 07/18/2004 Document Revised: 05/15/2013 Document Reviewed: 09/02/2012 [facility] Patient Information  2015 [place], [place]. This information is not intended to replace advice given to you by your health care provider. Make sure you discuss any questions you have with your health care provider. [phone]

## 2014-09-18 NOTE — H&P (Signed)
Pulmonary Critical Care     Rick Clark:[id number] DOB: 1945-06-29 DOA: 09/18/2014   PCP: [name], MD   Chief Complaint: Persistant pneumonia  HPI: Rick Clark is a 69 y.o. male presents to the hospital for a bronchoscopy. He has had a CT scan that shows persistant pneumonia with possible abscess formation. Patient has had some weight loss and cough with thick sputum production and an abnormal CT   Review of Systems:  ROS performed and is unremarkable  Past Medical History  Diagnosis Date  . Pneumonia Nov '15  . Lung cancer 1990    Patient states RUL Thoracotomy.  Marland Kitchen COPD (chronic obstructive pulmonary disease)   . Shortness of breath dyspnea    Past Surgical History  Procedure Laterality Date  . Lung lobectomy Right     Upper lobe   Social History:  reports that he has been smoking Cigarettes.  He has a 27.5 pack-year smoking history. He has never used smokeless tobacco. He reports that he does not drink alcohol or use illicit drugs.  No Known Allergies  Family History  Problem Relation Age of Onset  . Coronary artery disease Father   . Diabetes Mother     Prior to Admission medications   Medication Sig Start Date End Date Taking? Authorizing Provider  albuterol (PROVENTIL HFA;VENTOLIN HFA) 108 (90 BASE) MCG/ACT inhaler Inhale 1 puff into the lungs every 6 (six) hours as needed for wheezing or shortness of breath.   Yes Historical Provider, MD  budesonide-formoterol (SYMBICORT) 160-4.5 MCG/ACT inhaler Inhale 2 puffs into the lungs 2 (two) times daily.   Yes Historical Provider, MD  ibuprofen (ADVIL,MOTRIN) 200 MG tablet Take 400 mg by mouth every 6 (six) hours as needed for headache or mild pain.   Yes Historical Provider, MD  tiotropium (SPIRIVA) 18 MCG inhalation capsule Place 18 mcg into inhaler and inhale daily.   Yes Historical Provider, MD  levofloxacin (LEVAQUIN) 250 MG tablet Take 1 tablet (250 mg total) by mouth daily. Patient not taking:  Reported on 07/12/2014 07/02/14   [name], MD   Physical Exam: Filed Vitals:   09/18/14 1145 09/18/14 1200 09/18/14 1215 09/18/14 1230  BP: 104/57 100/56 88/56 114/76  Pulse: 74 72 74 74  Temp:      TempSrc:      Resp: '26 21 24 17  '$ Height:      Weight:      SpO2: 95% 92% 95% 97%    Wt Readings from Last 3 Encounters:  09/18/14 38.102 kg (84 lb)  07/12/14 38.4 kg (84 lb 10.5 oz)  07/02/14 37.83 kg (83 lb 6.4 oz)    General:  Appears calm and comfortable Eyes: PERRL, normal lids, irises & conjunctiva ENT: grossly normal hearing, lips & tongue Neck: no LAD, masses or thyromegaly Cardiovascular: RRR, no m/r/g. No LE edema. Respiratory: CTA bilaterally, no w/r/r.          Labs on Admission:  Basic Metabolic Panel: No results for input(s): NA, K, CL, CO2, GLUCOSE, BUN, CREATININE, CALCIUM, MG, PHOS in the last 168 hours. Liver Function Tests: No results for input(s): AST, ALT, ALKPHOS, BILITOT, PROT, ALBUMIN in the last 168 hours. No results for input(s): LIPASE, AMYLASE in the last 168 hours. No results for input(s): AMMONIA in the last 168 hours. CBC: No results for input(s): WBC, NEUTROABS, HGB, HCT, MCV, PLT in the last 168 hours. Cardiac Enzymes: No results for input(s): CKTOTAL, CKMB, CKMBINDEX, TROPONINI in the last 168 hours.  BNP (last 3 results) No results for input(s): BNP in the last 8760 hours.  ProBNP (last 3 results) No results for input(s): PROBNP in the last 8760 hours.  CBG: No results for input(s): GLUCAP in the last 168 hours.  Radiological Exams on Admission: No results found.  EKG: Independently reviewed.  Assessment/Plan Active Problems:   * No active hospital problems. *   1. Persistant Pneumonia -will proceed with bronchoscopy for cultures -risks and benefits have been explained to the patient including but not limited to the potential for bleeding collapsed lung low oxygen and cardiac arrest      I have personally obtained a  history, examined the patient, evaluated laboratory and imaging results, formulated the assessment and plan and placed orders.  The Patient requires high complexity decision making for assessment and support.    Allyne Gee, MD [facility] Pulmonary Critical Care Medicine Sleep Medicine

## 2014-09-18 NOTE — Op Note (Signed)
[facility] Patient Name: Rick Clark Procedure Date: 09/18/2014 10:57 AM MRN: [id number] Account #: [id number] Date of Birth: Dec 03, 1945 Admit Type: Outpatient Age: 69 Room: [facility] PROCEDURE RM 02 Gender: Male Note Status: Finalized Attending MD: Allyne Gee, MD Procedure:         Bronchoscopy Indications:       Left lower lobe mass, Diagnostic bronchoalveolar lavage Providers:         Allyne Gee, MD, Sullivan Lone, Technician (Technician) Referring MD:       Medicines:         Jelly 2% Xylocaine per nare  mL, Oropharynx Exactacain                     Spray 1 units, Lidocaine applied to nares and subglottic                     space, Midazolam 2 mg IV, Fentanyl 50 mcg IV Complications:     No immediate complications Procedure:         Pre-Anesthesia Assessment:                    - A History and Physical has been performed. The patient's                     medications, allergies and sensitivities have been                     reviewed.                    - The risks and benefits of the procedure and the sedation                     options and risks were discussed with the patient. All                     questions were answered and informed consent was obtained.                    - Pre-procedure physical examination revealed no                     contraindications to sedation.                    - ASA Grade Assessment: III - A patient with severe                     systemic disease.                    - After reviewing the risks and benefits, the patient was                     deemed in satisfactory condition to undergo the procedure.                    - The anesthesia plan was to use moderate                     sedation/analgesia.                    - Immediately prior to administration of medications, the  patient was re-assessed for adequacy to receive sedatives.                    - The heart rate, respiratory rate,  oxygen saturations,                     blood pressure, adequacy of pulmonary ventilation, and                     response to care were monitored throughout the procedure.                    - The physical status of the patient was re-assessed after                     the procedure.                    After obtaining informed consent, the bronchoscope was                     passed under direct vision. Throughout the procedure, the                     patient's blood pressure, pulse, and oxygen saturations                     were monitored continuously. the Bronchoscope Olympus                     BF-Q180 S# [id number] was introduced through the right                     nostril and advanced to the tracheobronchial tree of both                     lungs. The procedure was accomplished without difficulty.                     The patient tolerated the procedure well. The total                     duration of the procedure was 27 minutes. Findings:      Bilateral Lung Abnormalities: An area of chronically inflamed mucosa was       found throughout the tracheobronchial tree. Copious, grey and tenacious       secretions were found throughout the tracheobronchial tree. They were       not obstructing the airway.      Bronchoalveolar lavage was performed in the LLL posterior basal segment       (B10) of the lung and sent for cell count, bacterial culture, viral       smears & culture, and fungal & AFB analysis and cytology. 100 mL of       fluid were instilled. 75 mL were returned. The return was mucopurulent.       There were no mucoid plugs in the return fluid Multiple specimens were       obtained, and each sent for analysis Impression:        - Bronchoalveolar lavage                    - Chronic mucosal inflammation was visualized throughout  the tracheobronchial tree.                    - Copious, grey and tenacious secretions were found                     throughout  the tracheobronchial tree.                    - Bronchoalveolar lavage was performed. Recommendation:    - Await BAL, culture and cytology results. Devona Konig, MD Allyne Gee, MD 09/18/2014 11:31:38 AM This report has been signed electronically. Number of Addenda: 0 Note Initiated On: 09/18/2014 10:57 AM      [facility]

## 2014-09-21 ENCOUNTER — Encounter: Payer: Self-pay | Admitting: Internal Medicine

## 2014-09-21 LAB — CYTOLOGY - NON PAP

## 2014-09-22 LAB — CULTURE, BAL-QUANTITATIVE W GRAM STAIN: Special Requests: NORMAL

## 2014-09-22 LAB — CULTURE, BAL-QUANTITATIVE

## 2014-09-27 ENCOUNTER — Other Ambulatory Visit: Payer: Self-pay | Admitting: Internal Medicine

## 2014-09-27 ENCOUNTER — Ambulatory Visit
Admission: RE | Admit: 2014-09-27 | Discharge: 2014-09-27 | Disposition: A | Discharge status: Home / Self Care | Payer: PPO | Source: Ambulatory Visit | Attending: Internal Medicine | Admitting: Internal Medicine

## 2014-09-27 DIAGNOSIS — J189 Pneumonia, unspecified organism: Secondary | ICD-10-CM | POA: Insufficient documentation

## 2014-11-01 LAB — ACID FAST SMEAR+CULTURE W/RFLX (ARMC ONLY)
Acid Fast Culture: NEGATIVE
Acid Fast Smear: NEGATIVE

## 2014-11-02 ENCOUNTER — Other Ambulatory Visit: Payer: Self-pay | Admitting: Physician Assistant

## 2014-11-02 DIAGNOSIS — J150 Pneumonia due to Klebsiella pneumoniae: Secondary | ICD-10-CM

## 2014-12-04 ENCOUNTER — Ambulatory Visit
Admission: RE | Admit: 2014-12-04 | Discharge: 2014-12-04 | Disposition: A | Discharge status: Home / Self Care | Payer: PPO | Source: Ambulatory Visit | Attending: Physician Assistant | Admitting: Physician Assistant

## 2014-12-04 DIAGNOSIS — R911 Solitary pulmonary nodule: Secondary | ICD-10-CM | POA: Insufficient documentation

## 2014-12-04 DIAGNOSIS — J150 Pneumonia due to Klebsiella pneumoniae: Secondary | ICD-10-CM | POA: Diagnosis not present

## 2014-12-04 DIAGNOSIS — J439 Emphysema, unspecified: Secondary | ICD-10-CM | POA: Insufficient documentation

## 2014-12-04 MED ORDER — IOHEXOL 300 MG/ML  SOLN
50.0000 mL | Freq: Once | INTRAMUSCULAR | Status: AC | PRN
  Administered 2014-12-04: 50 mL via INTRAVENOUS

## 2014-12-13 ENCOUNTER — Other Ambulatory Visit: Payer: Self-pay | Admitting: Physician Assistant

## 2014-12-13 DIAGNOSIS — R911 Solitary pulmonary nodule: Secondary | ICD-10-CM

## 2014-12-18 ENCOUNTER — Ambulatory Visit
Admission: RE | Admit: 2014-12-18 | Discharge: 2014-12-18 | Disposition: A | Discharge status: Home / Self Care | Payer: PPO | Source: Ambulatory Visit | Attending: Physician Assistant | Admitting: Physician Assistant

## 2014-12-18 DIAGNOSIS — R918 Other nonspecific abnormal finding of lung field: Secondary | ICD-10-CM | POA: Diagnosis not present

## 2014-12-18 DIAGNOSIS — D3501 Benign neoplasm of right adrenal gland: Secondary | ICD-10-CM | POA: Insufficient documentation

## 2014-12-18 DIAGNOSIS — Z0189 Encounter for other specified special examinations: Secondary | ICD-10-CM | POA: Diagnosis present

## 2014-12-18 DIAGNOSIS — R590 Localized enlarged lymph nodes: Secondary | ICD-10-CM | POA: Diagnosis not present

## 2014-12-18 DIAGNOSIS — I2510 Atherosclerotic heart disease of native coronary artery without angina pectoris: Secondary | ICD-10-CM | POA: Insufficient documentation

## 2014-12-18 DIAGNOSIS — N200 Calculus of kidney: Secondary | ICD-10-CM | POA: Insufficient documentation

## 2014-12-18 DIAGNOSIS — R911 Solitary pulmonary nodule: Secondary | ICD-10-CM | POA: Insufficient documentation

## 2014-12-18 DIAGNOSIS — J432 Centrilobular emphysema: Secondary | ICD-10-CM | POA: Insufficient documentation

## 2014-12-18 LAB — GLUCOSE, CAPILLARY: GLUCOSE-CAPILLARY: 107 mg/dL — AB (ref 65–99)

## 2014-12-18 MED ORDER — FLUDEOXYGLUCOSE F - 18 (FDG) INJECTION
12.4700 | Freq: Once | INTRAVENOUS | Status: AC | PRN
  Administered 2014-12-18: 12.47 via INTRAVENOUS

## 2015-01-08 LAB — CULTURE, FUNGUS WITHOUT SMEAR: SPECIAL REQUESTS: NORMAL

## 2015-01-16 ENCOUNTER — Other Ambulatory Visit: Payer: Self-pay | Admitting: Otolaryngology

## 2015-01-16 DIAGNOSIS — F17210 Nicotine dependence, cigarettes, uncomplicated: Secondary | ICD-10-CM | POA: Diagnosis not present

## 2015-01-16 DIAGNOSIS — J3800 Paralysis of vocal cords and larynx, unspecified: Secondary | ICD-10-CM

## 2015-01-16 DIAGNOSIS — J3801 Paralysis of vocal cords and larynx, unilateral: Secondary | ICD-10-CM | POA: Diagnosis not present

## 2015-01-17 ENCOUNTER — Inpatient Hospital Stay: Payer: PPO | Attending: Oncology | Admitting: Oncology

## 2015-01-17 ENCOUNTER — Inpatient Hospital Stay: Payer: PPO | Admitting: *Deleted

## 2015-01-17 VITALS — BP 103/61 | HR 75 | Temp 96.9°F | Resp 16 | Ht 64.96 in | Wt 87.7 lb

## 2015-01-17 DIAGNOSIS — Z85118 Personal history of other malignant neoplasm of bronchus and lung: Secondary | ICD-10-CM | POA: Diagnosis not present

## 2015-01-17 DIAGNOSIS — J449 Chronic obstructive pulmonary disease, unspecified: Secondary | ICD-10-CM | POA: Diagnosis not present

## 2015-01-17 DIAGNOSIS — Z79899 Other long term (current) drug therapy: Secondary | ICD-10-CM | POA: Diagnosis not present

## 2015-01-17 DIAGNOSIS — R0602 Shortness of breath: Secondary | ICD-10-CM | POA: Diagnosis not present

## 2015-01-17 DIAGNOSIS — R918 Other nonspecific abnormal finding of lung field: Secondary | ICD-10-CM | POA: Insufficient documentation

## 2015-01-17 DIAGNOSIS — Z8701 Personal history of pneumonia (recurrent): Secondary | ICD-10-CM | POA: Insufficient documentation

## 2015-01-17 DIAGNOSIS — J918 Pleural effusion in other conditions classified elsewhere: Principal | ICD-10-CM

## 2015-01-17 DIAGNOSIS — Z7982 Long term (current) use of aspirin: Secondary | ICD-10-CM

## 2015-01-17 DIAGNOSIS — F17210 Nicotine dependence, cigarettes, uncomplicated: Secondary | ICD-10-CM | POA: Diagnosis not present

## 2015-01-17 DIAGNOSIS — J189 Pneumonia, unspecified organism: Secondary | ICD-10-CM

## 2015-01-17 LAB — COMPREHENSIVE METABOLIC PANEL
ALT: 18 U/L (ref 17–63)
AST: 21 U/L (ref 15–41)
Albumin: 3.7 g/dL (ref 3.5–5.0)
Alkaline Phosphatase: 79 U/L (ref 38–126)
Anion gap: 7 (ref 5–15)
BILIRUBIN TOTAL: 0.6 mg/dL (ref 0.3–1.2)
BUN: 12 mg/dL (ref 6–20)
CHLORIDE: 96 mmol/L — AB (ref 101–111)
CO2: 30 mmol/L (ref 22–32)
CREATININE: 0.72 mg/dL (ref 0.61–1.24)
Calcium: 8.9 mg/dL (ref 8.9–10.3)
Glucose, Bld: 95 mg/dL (ref 65–99)
POTASSIUM: 3.8 mmol/L (ref 3.5–5.1)
Sodium: 133 mmol/L — ABNORMAL LOW (ref 135–145)
TOTAL PROTEIN: 8.1 g/dL (ref 6.5–8.1)

## 2015-01-17 LAB — CBC WITH DIFFERENTIAL/PLATELET
BASOS ABS: 0.1 10*3/uL (ref 0–0.1)
BASOS PCT: 1 %
EOS ABS: 0.2 10*3/uL (ref 0–0.7)
EOS PCT: 2 %
HCT: 43.8 % (ref 40.0–52.0)
Hemoglobin: 14.3 g/dL (ref 13.0–18.0)
Lymphocytes Relative: 29 %
Lymphs Abs: 2.6 10*3/uL (ref 1.0–3.6)
MCH: 26.5 pg (ref 26.0–34.0)
MCHC: 32.7 g/dL (ref 32.0–36.0)
MCV: 81.1 fL (ref 80.0–100.0)
MONO ABS: 0.6 10*3/uL (ref 0.2–1.0)
MONOS PCT: 7 %
Neutro Abs: 5.3 10*3/uL (ref 1.4–6.5)
Neutrophils Relative %: 61 %
PLATELETS: 345 10*3/uL (ref 150–440)
RBC: 5.4 MIL/uL (ref 4.40–5.90)
RDW: 14.3 % (ref 11.5–14.5)
WBC: 8.7 10*3/uL (ref 3.8–10.6)

## 2015-01-17 LAB — APTT: APTT: 33 s (ref 24–36)

## 2015-01-17 LAB — PROTIME-INR
INR: 1.03
Prothrombin Time: 13.7 seconds (ref 11.4–15.0)

## 2015-01-18 NOTE — Progress Notes (Signed)
[place]  [phone] [phone]  ID: Rick Clark OB: 03-Apr-1945  MR#: [id number]  YDX#:[id number]  Patient Care Team: [name], MD as PCP - General (Internal Medicine)  CHIEF COMPLAINT:  Chief Complaint  Patient presents with  . New Evaluation    abnormal PET     INTERVAL HISTORY: Patient is a 70 year old male who was found to have multiple bilateral PET positive pulmonary nodules on recent scan. CT scan from November 2016 also revealed pulmonary nodules, some of which have grown in size. Patient reports he had a bronchoscopy last fall as well which he reports was "normal". Currently, he feels well and is asymptomatic. He has no neurologic complaints. He denies any recent fevers. He has a good appetite and denies weight loss. He has no chest pain, shortness of breath, cough, or hemoptysis. He denies any nausea, vomiting, constipation, or diarrhea. He has no urinary complaints. Patient feels at his baseline and offers no specific complaints today.  REVIEW OF SYSTEMS:   Review of Systems  Constitutional: Negative for fever, weight loss and malaise/fatigue.  HENT: Negative.   Respiratory: Negative.  Negative for cough, hemoptysis and shortness of breath.   Cardiovascular: Negative.  Negative for chest pain.  Gastrointestinal: Negative.   Neurological: Negative.  Negative for weakness.    As per HPI. Otherwise, a complete review of systems is negatve.  PAST MEDICAL HISTORY: Past Medical History  Diagnosis Date  . Pneumonia Nov '15  . COPD (chronic obstructive pulmonary disease) ([place])   . Shortness of breath dyspnea   . Lung cancer [facility]) 1990    Patient states RUL Thoracotomy.    PAST SURGICAL HISTORY: Past Surgical History  Procedure Laterality Date  . Lung lobectomy Right     Upper lobe  . Flexible bronchoscopy Bilateral 09/18/2014    Procedure: FLEXIBLE BRONCHOSCOPY;  Surgeon: Allyne Gee, MD;  Location: [facility] ORS;   Service: Pulmonary;  Laterality: Bilateral;    FAMILY HISTORY Family History  Problem Relation Age of Onset  . Coronary artery disease Father   . Diabetes Mother        ADVANCED DIRECTIVES:    HEALTH MAINTENANCE: Social History  Substance Use Topics  . Smoking status: Current Every Day Smoker -- 0.50 packs/day for 55 years    Types: Cigarettes  . Smokeless tobacco: Never Used  . Alcohol Use: No     Colonoscopy:  PAP:  Bone density:  Lipid panel:  No Known Allergies  Current Outpatient Prescriptions  Medication Sig Dispense Refill  . albuterol (PROVENTIL HFA;VENTOLIN HFA) 108 (90 BASE) MCG/ACT inhaler Inhale 1 puff into the lungs every 6 (six) hours as needed for wheezing or shortness of breath.    Marland Kitchen aspirin 81 MG tablet Take 81 mg by mouth daily.    . budesonide-formoterol (SYMBICORT) 160-4.5 MCG/ACT inhaler Inhale 2 puffs into the lungs 2 (two) times daily.    Marland Kitchen ibuprofen (ADVIL,MOTRIN) 200 MG tablet Take 400 mg by mouth every 6 (six) hours as needed for headache or mild pain.    Marland Kitchen tiotropium (SPIRIVA) 18 MCG inhalation capsule Place 18 mcg into inhaler and inhale daily.    Marland Kitchen levofloxacin (LEVAQUIN) 250 MG tablet Take 1 tablet (250 mg total) by mouth daily. (Patient not taking: Reported on 07/12/2014) 7 tablet 0   No current facility-administered medications for this visit.    OBJECTIVE: Filed Vitals:   01/17/15 1017  BP: 103/61  Pulse: 75  Temp: 96.9 F (36.1 C)  Resp: 16     Body mass index is 14.62 kg/(m^2).    ECOG FS:0 - Asymptomatic  General: Thin, no acute distress. Eyes: Pink conjunctiva, anicteric sclera. HEENT: Normocephalic, moist mucous membranes, clear oropharnyx. Lungs: Clear to auscultation bilaterally. Heart: Regular rate and rhythm. No rubs, murmurs, or gallops. Abdomen: Soft, nontender, nondistended. No organomegaly noted, normoactive bowel sounds. Musculoskeletal: No edema, cyanosis, or clubbing. Neuro: Alert, answering all questions  appropriately. Cranial nerves grossly intact. Skin: No rashes or petechiae noted. Psych: Normal affect. Lymphatics: No cervical, calvicular, axillary or inguinal LAD.   LAB RESULTS:  Lab Results  Component Value Date   NA 133* 01/17/2015   K 3.8 01/17/2015   CL 96* 01/17/2015   CO2 30 01/17/2015   GLUCOSE 95 01/17/2015   BUN 12 01/17/2015   CREATININE 0.72 01/17/2015   CALCIUM 8.9 01/17/2015   PROT 8.1 01/17/2015   ALBUMIN 3.7 01/17/2015   AST 21 01/17/2015   ALT 18 01/17/2015   ALKPHOS 79 01/17/2015   BILITOT 0.6 01/17/2015   GFRNONAA >60 01/17/2015   GFRAA >60 01/17/2015    Lab Results  Component Value Date   WBC 8.7 01/17/2015   NEUTROABS 5.3 01/17/2015   HGB 14.3 01/17/2015   HCT 43.8 01/17/2015   MCV 81.1 01/17/2015   PLT 345 01/17/2015     STUDIES: No results found.  ASSESSMENT: Bilateral, PET positive pulmonary nodules suspicious for underlying malignancy.  PLAN:    1. Pulmonary nodules: Unclear etiology, but given patient's extensive tobacco history as well as several lesions have increased in size slightly since November. This is concerning for underlying malignancy. Have referred patient for ENB for further evaluation and biopsy. Patient will return to clinic approximately one week after his biopsy to discuss the results and any treatment planning if necessary. If biopsy is unobtainable or unsuccessful, plan to repeat CT scan in 3 months to assess for interval change of his lesions.  Approximately 45 minutes was spent in discussion of which greater than 50% was consultation.  Patient expressed understanding and was in agreement with this plan. He also understands that He can call clinic at any time with any questions, concerns, or complaints.    Lloyd Huger, MD   01/18/2015 1:01 PM

## 2015-01-23 ENCOUNTER — Ambulatory Visit
Admission: RE | Admit: 2015-01-23 | Discharge: 2015-01-23 | Disposition: A | Discharge status: Home / Self Care | Payer: PPO | Source: Ambulatory Visit | Attending: Otolaryngology | Admitting: Otolaryngology

## 2015-01-23 DIAGNOSIS — J3800 Paralysis of vocal cords and larynx, unspecified: Secondary | ICD-10-CM

## 2015-01-23 DIAGNOSIS — J3801 Paralysis of vocal cords and larynx, unilateral: Secondary | ICD-10-CM | POA: Insufficient documentation

## 2015-01-23 MED ORDER — IOHEXOL 300 MG/ML  SOLN
75.0000 mL | Freq: Once | INTRAMUSCULAR | Status: AC | PRN
  Administered 2015-01-23: 75 mL via INTRAVENOUS

## 2015-01-24 ENCOUNTER — Emergency Department
Admission: EM | Admit: 2015-01-24 | Discharge: 2015-01-24 | Disposition: A | Discharge status: Home / Self Care | Payer: PPO | Attending: Emergency Medicine | Admitting: Emergency Medicine

## 2015-01-24 ENCOUNTER — Encounter: Payer: Self-pay | Admitting: Medical Oncology

## 2015-01-24 DIAGNOSIS — Z792 Long term (current) use of antibiotics: Secondary | ICD-10-CM | POA: Diagnosis not present

## 2015-01-24 DIAGNOSIS — L308 Other specified dermatitis: Secondary | ICD-10-CM | POA: Diagnosis not present

## 2015-01-24 DIAGNOSIS — F17210 Nicotine dependence, cigarettes, uncomplicated: Secondary | ICD-10-CM | POA: Diagnosis not present

## 2015-01-24 DIAGNOSIS — Z79899 Other long term (current) drug therapy: Secondary | ICD-10-CM | POA: Insufficient documentation

## 2015-01-24 DIAGNOSIS — L309 Dermatitis, unspecified: Secondary | ICD-10-CM | POA: Diagnosis not present

## 2015-01-24 DIAGNOSIS — Z7951 Long term (current) use of inhaled steroids: Secondary | ICD-10-CM | POA: Insufficient documentation

## 2015-01-24 DIAGNOSIS — R21 Rash and other nonspecific skin eruption: Secondary | ICD-10-CM | POA: Diagnosis not present

## 2015-01-24 DIAGNOSIS — Z7982 Long term (current) use of aspirin: Secondary | ICD-10-CM | POA: Insufficient documentation

## 2015-01-24 MED ORDER — TRIAMCINOLONE ACETONIDE 0.1 % EX CREA: 1.0000 "application " | TOPICAL_CREAM | Freq: Four times a day (QID) | CUTANEOUS | Status: DC

## 2015-01-24 NOTE — ED Provider Notes (Signed)
[facility] Emergency Department Provider Note  ____________________________________________  Time seen: Approximately 9:18 AM  I have reviewed the triage vital signs and the nursing notes.   HISTORY  Chief Complaint Rash    HPI Rick Clark is a 70 y.o. male who presents to emergency department complaining of a itchy red rash to bilateral shoulders, chest, bilateral upper extremities 1 week. Patient denies any change in topical still include soaps and shampoos, laundry detergent, new closed. Patient denies any fevers or chills, chest pain, shortness of breath, abdominal pain, nausea or vomiting.   Past Medical History  Diagnosis Date  . Pneumonia Nov '15  . COPD (chronic obstructive pulmonary disease) ([place])   . Shortness of breath dyspnea   . Lung cancer [facility] 1990    Patient states RUL Thoracotomy.    Patient Active Problem List   Diagnosis Date Noted  . Protein-calorie malnutrition, severe ([place]) 06/24/2014  . Parapneumonic effusion 06/23/2014  . Failure to thrive in adult 06/23/2014  . Pneumonia 06/22/2014    Past Surgical History  Procedure Laterality Date  . Lung lobectomy Right     Upper lobe  . Flexible bronchoscopy Bilateral 09/18/2014    Procedure: FLEXIBLE BRONCHOSCOPY;  Surgeon: Allyne Gee, MD;  Location: [facility] ORS;  Service: Pulmonary;  Laterality: Bilateral;    Current Outpatient Rx  Name  Route  Sig  Dispense  Refill  . albuterol (PROVENTIL HFA;VENTOLIN HFA) 108 (90 BASE) MCG/ACT inhaler   Inhalation   Inhale 1 puff into the lungs every 6 (six) hours as needed for wheezing or shortness of breath.         Marland Kitchen aspirin 81 MG tablet   Oral   Take 81 mg by mouth daily.         . budesonide-formoterol (SYMBICORT) 160-4.5 MCG/ACT inhaler   Inhalation   Inhale 2 puffs into the lungs 2 (two) times daily.         Marland Kitchen ibuprofen (ADVIL,MOTRIN) 200 MG tablet   Oral   Take 400 mg by mouth every 6 (six) hours as needed for  headache or mild pain.         Marland Kitchen levofloxacin (LEVAQUIN) 250 MG tablet   Oral   Take 1 tablet (250 mg total) by mouth daily. Patient not taking: Reported on 07/12/2014   7 tablet   0   . tiotropium (SPIRIVA) 18 MCG inhalation capsule   Inhalation   Place 18 mcg into inhaler and inhale daily.         Marland Kitchen triamcinolone cream (KENALOG) 0.1 %   Topical   Apply 1 application topically 4 (four) times daily.   30 g   0     Mix in a 1/1 ratio with Eucerin     Allergies Review of patient's allergies indicates no known allergies.  Family History  Problem Relation Age of Onset  . Coronary artery disease Father   . Diabetes Mother     Social History Social History  Substance Use Topics  . Smoking status: Current Every Day Smoker -- 0.50 packs/day for 55 years    Types: Cigarettes  . Smokeless tobacco: Never Used  . Alcohol Use: No     Review of Systems  Constitutional: No fever/chills Eyes: No visual changes. No discharge ENT: No sore throat. Cardiovascular: no chest pain. Respiratory: no cough. No SOB. Gastrointestinal: No abdominal pain.  No nausea, no vomiting.  No diarrhea.  No constipation. Genitourinary: Negative for dysuria. No hematuria Musculoskeletal: Negative  for back pain. Skin: Positive for rash. Neurological: Negative for headaches, focal weakness or numbness. 10-point ROS otherwise negative.  ____________________________________________   PHYSICAL EXAM:  VITAL SIGNS: ED Triage Vitals  Enc Vitals Group     BP 01/24/15 0848 108/60 mmHg     Pulse Rate 01/24/15 0848 85     Resp 01/24/15 0848 17     Temp 01/24/15 0848 97.5 F (36.4 C)     Temp Source 01/24/15 0848 Oral     SpO2 01/24/15 0848 96 %     Weight 01/24/15 0848 88 lb (39.917 kg)     Height 01/24/15 0848 '5\' 5"'$  (1.651 m)     Head Cir --      Peak Flow --      Pain Score --      Pain Loc --      Pain Edu? --      Excl. in [place]? --      Constitutional: Alert and oriented. Well  appearing and in no acute distress. Eyes: Conjunctivae are normal. PERRL. EOMI. Head: Atraumatic. ENT:      Ears:      Nose: No congestion/rhinnorhea.      Mouth/Throat: Mucous membranes are moist.  Neck: No stridor.   Hematological/Lymphatic/Immunilogical: No cervical lymphadenopathy. Cardiovascular: Normal rate, regular rhythm. Normal S1 and S2.  Good peripheral circulation. Respiratory: Normal respiratory effort without tachypnea or retractions. Lungs CTAB. Gastrointestinal: Soft and nontender. No distention. No CVA tenderness. Musculoskeletal: No lower extremity tenderness nor edema.  No joint effusions. Neurologic:  Normal speech and language. No gross focal neurologic deficits are appreciated.  Skin:  Skin is warm, dry and intact. Erythematous and mildly edematous patches noted to bilateral posterior shoulders. These are dry in appearance, slightly scaly. No central erosion or clearing. No firmness to palpation. No fluctuance. Psychiatric: Mood and affect are normal. Speech and behavior are normal. Patient exhibits appropriate insight and judgement.   ____________________________________________   LABS (all labs ordered are listed, but only abnormal results are displayed)  Labs Reviewed - No data to display ____________________________________________  EKG   ____________________________________________  RADIOLOGY   ____________________________________________    PROCEDURES  Procedure(s) performed:       Medications - No data to display   ____________________________________________   INITIAL IMPRESSION / ASSESSMENT AND PLAN / ED COURSE  Pertinent labs & imaging results that were available during my care of the patient were reviewed by me and considered in my medical decision making (see chart for details).  Patient's diagnosis is consistent with eczema. Patient will be discharged home with prescriptions for triamcinolone with Eucerin ointment. Patient is  to follow up with primary care provider if symptoms persist past this treatment course. Patient is given ED precautions to return to the ED for any worsening or new symptoms.     ____________________________________________  FINAL CLINICAL IMPRESSION(S) / ED DIAGNOSES  Final diagnoses:  Eczema      NEW MEDICATIONS STARTED DURING THIS VISIT:  New Prescriptions   TRIAMCINOLONE CREAM (KENALOG) 0.1 %    Apply 1 application topically 4 (four) times daily.        Charline Bills Lyrik Dockstader, PA-C 01/24/15 [place], MD 01/24/15 1538

## 2015-01-24 NOTE — ED Notes (Signed)
Assessment per PA 

## 2015-01-24 NOTE — Discharge Instructions (Signed)
Eczema Eczema, also called atopic dermatitis, is a skin disorder that causes inflammation of the skin. It causes a red rash and dry, scaly skin. The skin becomes very itchy. Eczema is generally worse during the cooler winter months and often improves with the warmth of summer. Eczema usually starts showing signs in infancy. Some children outgrow eczema, but it may last through adulthood.  CAUSES  The exact cause of eczema is not known, but it appears to run in families. People with eczema often have a family history of eczema, allergies, asthma, or hay fever. Eczema is not contagious. Flare-ups of the condition may be caused by:   Contact with something you are sensitive or allergic to.   Stress. SIGNS AND SYMPTOMS  Dry, scaly skin.   Red, itchy rash.   Itchiness. This may occur before the skin rash and may be very intense.  DIAGNOSIS  The diagnosis of eczema is usually made based on symptoms and medical history. TREATMENT  Eczema cannot be cured, but symptoms usually can be controlled with treatment and other strategies. A treatment plan might include:  Controlling the itching and scratching.   Use over-the-counter antihistamines as directed for itching. This is especially useful at night when the itching tends to be worse.   Use over-the-counter steroid creams as directed for itching.   Avoid scratching. Scratching makes the rash and itching worse. It may also result in a skin infection (impetigo) due to a break in the skin caused by scratching.   Keeping the skin well moisturized with creams every day. This will seal in moisture and help prevent dryness. Lotions that contain alcohol and water should be avoided because they can dry the skin.   Limiting exposure to things that you are sensitive or allergic to (allergens).   Recognizing situations that cause stress.   Developing a plan to manage stress.  HOME CARE INSTRUCTIONS   Only take over-the-counter or  prescription medicines as directed by your health care provider.   Do not use anything on the skin without checking with your health care provider.   Keep baths or showers short (5 minutes) in warm (not hot) water. Use mild cleansers for bathing. These should be unscented. You may add nonperfumed bath oil to the bath water. It is best to avoid soap and bubble bath.   Immediately after a bath or shower, when the skin is still damp, apply a moisturizing ointment to the entire body. This ointment should be a petroleum ointment. This will seal in moisture and help prevent dryness. The thicker the ointment, the better. These should be unscented.   Keep fingernails cut short. Children with eczema may need to wear soft gloves or mittens at night after applying an ointment.   Dress in clothes made of cotton or cotton blends. Dress lightly, because heat increases itching.   A child with eczema should stay away from anyone with fever blisters or cold sores. The virus that causes fever blisters (herpes simplex) can cause a serious skin infection in children with eczema. SEEK MEDICAL CARE IF:   Your itching interferes with sleep.   Your rash gets worse or is not better within 1 week after starting treatment.   You see pus or soft yellow scabs in the rash area.   You have a fever.   You have a rash flare-up after contact with someone who has fever blisters.    This information is not intended to replace advice given to you by your health care   provider. Make sure you discuss any questions you have with your health care provider.   Document Released: 12/27/1999 Document Revised: 10/19/2012 Document Reviewed: 08/01/2012 Elsevier Interactive Patient Education 2016 [employer]  

## 2015-01-24 NOTE — ED Notes (Signed)
Ambulatory to triage,  reports itchy rash to chest, back, left arm and BLE x 1 week. Pt in no distress.

## 2015-01-25 ENCOUNTER — Other Ambulatory Visit: Payer: Self-pay | Admitting: Oncology

## 2015-01-25 ENCOUNTER — Telehealth: Payer: Self-pay | Admitting: *Deleted

## 2015-01-25 DIAGNOSIS — R918 Other nonspecific abnormal finding of lung field: Secondary | ICD-10-CM

## 2015-01-25 NOTE — Telephone Encounter (Signed)
Pt has CT Scan on 02-05-15. When do pt need to return for f/u?

## 2015-01-25 NOTE — Telephone Encounter (Signed)
Notified patient of review of case in multidisiplinary conference with resulting recommendation for 3 month follow up imaging. Patient is aware that he should notify us for any changes in symptoms and will be expecting to be notified of CT scan and follow up appointments.

## 2015-01-25 NOTE — Telephone Encounter (Signed)
Thank you. Ct and scheduling order placed.

## 2015-01-25 NOTE — Telephone Encounter (Signed)
Move that scan to 3 months with f/u 1-2 days later.

## 2015-02-05 ENCOUNTER — Ambulatory Visit: Payer: PPO

## 2015-02-18 DIAGNOSIS — L209 Atopic dermatitis, unspecified: Secondary | ICD-10-CM | POA: Diagnosis not present

## 2015-02-18 DIAGNOSIS — B354 Tinea corporis: Secondary | ICD-10-CM | POA: Diagnosis not present

## 2015-02-18 DIAGNOSIS — F17210 Nicotine dependence, cigarettes, uncomplicated: Secondary | ICD-10-CM | POA: Diagnosis not present

## 2015-02-25 DIAGNOSIS — J431 Panlobular emphysema: Secondary | ICD-10-CM | POA: Diagnosis not present

## 2015-02-25 DIAGNOSIS — J3800 Paralysis of vocal cords and larynx, unspecified: Secondary | ICD-10-CM | POA: Diagnosis not present

## 2015-02-25 DIAGNOSIS — J449 Chronic obstructive pulmonary disease, unspecified: Secondary | ICD-10-CM | POA: Diagnosis not present

## 2015-02-25 DIAGNOSIS — I2510 Atherosclerotic heart disease of native coronary artery without angina pectoris: Secondary | ICD-10-CM | POA: Diagnosis not present

## 2015-02-25 DIAGNOSIS — F17210 Nicotine dependence, cigarettes, uncomplicated: Secondary | ICD-10-CM | POA: Diagnosis not present

## 2015-02-25 DIAGNOSIS — B371 Pulmonary candidiasis: Secondary | ICD-10-CM | POA: Diagnosis not present

## 2015-02-27 ENCOUNTER — Other Ambulatory Visit: Payer: Self-pay | Admitting: Physician Assistant

## 2015-02-27 ENCOUNTER — Other Ambulatory Visit: Payer: Self-pay | Admitting: Internal Medicine

## 2015-02-27 DIAGNOSIS — R9439 Abnormal result of other cardiovascular function study: Secondary | ICD-10-CM

## 2015-03-07 ENCOUNTER — Encounter
Admission: RE | Admit: 2015-03-07 | Discharge: 2015-03-07 | Disposition: A | Discharge status: Home / Self Care | Payer: PPO | Source: Ambulatory Visit | Attending: Physician Assistant | Admitting: Physician Assistant

## 2015-03-07 DIAGNOSIS — R9439 Abnormal result of other cardiovascular function study: Secondary | ICD-10-CM

## 2015-03-08 ENCOUNTER — Encounter
Admission: RE | Admit: 2015-03-08 | Discharge: 2015-03-08 | Disposition: A | Discharge status: Home / Self Care | Payer: PPO | Source: Ambulatory Visit | Attending: Physician Assistant | Admitting: Physician Assistant

## 2015-03-08 DIAGNOSIS — R9439 Abnormal result of other cardiovascular function study: Secondary | ICD-10-CM | POA: Insufficient documentation

## 2015-03-08 DIAGNOSIS — I208 Other forms of angina pectoris: Secondary | ICD-10-CM | POA: Diagnosis not present

## 2015-03-08 MED ORDER — TECHNETIUM TC 99M SESTAMIBI - CARDIOLITE
12.1000 | Freq: Once | INTRAVENOUS | Status: AC | PRN
  Administered 2015-03-08: 09:00:00 12.1 via INTRAVENOUS

## 2015-03-08 MED ORDER — TECHNETIUM TC 99M SESTAMIBI - CARDIOLITE
29.5000 | Freq: Once | INTRAVENOUS | Status: AC | PRN
  Administered 2015-03-08: 29.5 via INTRAVENOUS

## 2015-03-08 MED ORDER — REGADENOSON 0.4 MG/5ML IV SOLN
0.4000 mg | Freq: Once | INTRAVENOUS | Status: AC
  Administered 2015-03-08: 0.4 mg via INTRAVENOUS

## 2015-03-11 LAB — NM MYOCAR MULTI W/SPECT W/WALL MOTION / EF
CHL CUP NUCLEAR SDS: 5
CHL CUP NUCLEAR SRS: 1
CHL CUP NUCLEAR SSS: 15
CHL CUP RESTING HR STRESS: 57 {beats}/min
CSEPED: 1 min
CSEPEW: 1 METS
CSEPPHR: 88 {beats}/min
Exercise duration (sec): 2 s
LV dias vol: 97 mL
LVSYSVOL: 44 mL
TID: 0.79

## 2015-03-25 DIAGNOSIS — I2510 Atherosclerotic heart disease of native coronary artery without angina pectoris: Secondary | ICD-10-CM | POA: Diagnosis not present

## 2015-03-25 DIAGNOSIS — B371 Pulmonary candidiasis: Secondary | ICD-10-CM | POA: Diagnosis not present

## 2015-03-25 DIAGNOSIS — J449 Chronic obstructive pulmonary disease, unspecified: Secondary | ICD-10-CM | POA: Diagnosis not present

## 2015-03-25 DIAGNOSIS — D381 Neoplasm of uncertain behavior of trachea, bronchus and lung: Secondary | ICD-10-CM | POA: Diagnosis not present

## 2015-04-03 DIAGNOSIS — R0602 Shortness of breath: Secondary | ICD-10-CM | POA: Diagnosis not present

## 2015-04-22 ENCOUNTER — Ambulatory Visit
Admission: RE | Admit: 2015-04-22 | Discharge: 2015-04-22 | Disposition: A | Discharge status: Home / Self Care | Payer: PPO | Source: Ambulatory Visit | Attending: Oncology | Admitting: Oncology

## 2015-04-22 DIAGNOSIS — I700 Atherosclerosis of aorta: Secondary | ICD-10-CM | POA: Diagnosis not present

## 2015-04-22 DIAGNOSIS — R918 Other nonspecific abnormal finding of lung field: Secondary | ICD-10-CM

## 2015-04-22 DIAGNOSIS — R911 Solitary pulmonary nodule: Secondary | ICD-10-CM | POA: Diagnosis not present

## 2015-04-22 DIAGNOSIS — Z85118 Personal history of other malignant neoplasm of bronchus and lung: Secondary | ICD-10-CM | POA: Diagnosis not present

## 2015-04-22 DIAGNOSIS — Z87891 Personal history of nicotine dependence: Secondary | ICD-10-CM | POA: Diagnosis not present

## 2015-04-22 LAB — POCT I-STAT CREATININE: Creatinine, Ser: 0.7 mg/dL (ref 0.61–1.24)

## 2015-04-22 MED ORDER — IOPAMIDOL (ISOVUE-300) INJECTION 61%
65.0000 mL | Freq: Once | INTRAVENOUS | Status: AC | PRN
  Administered 2015-04-22: 65 mL via INTRAVENOUS

## 2015-04-29 ENCOUNTER — Inpatient Hospital Stay: Payer: PPO | Attending: Oncology | Admitting: Oncology

## 2015-04-29 VITALS — BP 111/67 | HR 84 | Temp 95.0°F | Resp 16 | Wt 87.1 lb

## 2015-04-29 DIAGNOSIS — Z8701 Personal history of pneumonia (recurrent): Secondary | ICD-10-CM | POA: Diagnosis not present

## 2015-04-29 DIAGNOSIS — J449 Chronic obstructive pulmonary disease, unspecified: Secondary | ICD-10-CM | POA: Insufficient documentation

## 2015-04-29 DIAGNOSIS — F17210 Nicotine dependence, cigarettes, uncomplicated: Secondary | ICD-10-CM | POA: Diagnosis not present

## 2015-04-29 DIAGNOSIS — Z7982 Long term (current) use of aspirin: Secondary | ICD-10-CM

## 2015-04-29 DIAGNOSIS — Z85118 Personal history of other malignant neoplasm of bronchus and lung: Secondary | ICD-10-CM

## 2015-04-29 DIAGNOSIS — R05 Cough: Secondary | ICD-10-CM | POA: Diagnosis not present

## 2015-04-29 DIAGNOSIS — R911 Solitary pulmonary nodule: Secondary | ICD-10-CM

## 2015-04-29 DIAGNOSIS — J181 Lobar pneumonia, unspecified organism: Secondary | ICD-10-CM | POA: Diagnosis not present

## 2015-04-29 DIAGNOSIS — Z79899 Other long term (current) drug therapy: Secondary | ICD-10-CM

## 2015-04-29 DIAGNOSIS — I700 Atherosclerosis of aorta: Secondary | ICD-10-CM | POA: Diagnosis not present

## 2015-04-29 DIAGNOSIS — R918 Other nonspecific abnormal finding of lung field: Secondary | ICD-10-CM | POA: Diagnosis not present

## 2015-04-29 MED ORDER — LEVOFLOXACIN 500 MG PO TABS: 500.0000 mg | ORAL_TABLET | Freq: Every day | ORAL | Status: DC

## 2015-04-29 NOTE — Progress Notes (Signed)
[place]  [phone] [phone]) [phone]  ID: Rick Clark OB: 03-Sep-1945  MR#: [id number]  XTK#:[id number]  Patient Care Team: [name], MD as PCP - General (Internal Medicine)  CHIEF COMPLAINT:  Chief Complaint  Patient presents with  . pulmonary nodules  . Results     INTERVAL HISTORY: Patient returns to clinic today for further evaluation and discussion of his imaging results. He has a persistent cough, but otherwise feels well. He has no neurologic complaints. He denies any recent fevers. He has a good appetite and denies weight loss. He has no chest pain, shortness of breath, cough, or hemoptysis. He denies any nausea, vomiting, constipation, or diarrhea. He has no urinary complaints. Patient feels at his baseline and offers no specific complaints today.  REVIEW OF SYSTEMS:   Review of Systems  Constitutional: Negative for fever, weight loss and malaise/fatigue.  HENT: Negative.   Respiratory: Negative.  Negative for cough, hemoptysis and shortness of breath.   Cardiovascular: Negative.  Negative for chest pain.  Gastrointestinal: Negative.   Neurological: Negative.  Negative for weakness.    As per HPI. Otherwise, a complete review of systems is negatve.  PAST MEDICAL HISTORY: Past Medical History  Diagnosis Date  . Pneumonia Nov '15  . COPD (chronic obstructive pulmonary disease) ([place])   . Shortness of breath dyspnea   . Lung cancer [facility]) 1990    Patient states RUL Thoracotomy.    PAST SURGICAL HISTORY: Past Surgical History  Procedure Laterality Date  . Lung lobectomy Right     Upper lobe  . Flexible bronchoscopy Bilateral 09/18/2014    Procedure: FLEXIBLE BRONCHOSCOPY;  Surgeon: Allyne Gee, MD;  Location: [facility] ORS;  Service: Pulmonary;  Laterality: Bilateral;    FAMILY HISTORY Family History  Problem Relation Age of Onset  . Coronary artery disease Father   . Diabetes Mother        ADVANCED DIRECTIVES:     HEALTH MAINTENANCE: Social History  Substance Use Topics  . Smoking status: Current Every Day Smoker -- 0.50 packs/day for 55 years    Types: Cigarettes  . Smokeless tobacco: Never Used  . Alcohol Use: No     Colonoscopy:  PAP:  Bone density:  Lipid panel:  No Known Allergies  Current Outpatient Prescriptions  Medication Sig Dispense Refill  . albuterol (PROVENTIL HFA;VENTOLIN HFA) 108 (90 BASE) MCG/ACT inhaler Inhale 1 puff into the lungs every 6 (six) hours as needed for wheezing or shortness of breath.    Marland Kitchen aspirin 81 MG tablet Take 81 mg by mouth daily.    . budesonide-formoterol (SYMBICORT) 160-4.5 MCG/ACT inhaler Inhale 2 puffs into the lungs 2 (two) times daily.    Marland Kitchen ibuprofen (ADVIL,MOTRIN) 200 MG tablet Take 400 mg by mouth every 6 (six) hours as needed for headache or mild pain.    Marland Kitchen tiotropium (SPIRIVA) 18 MCG inhalation capsule Place 18 mcg into inhaler and inhale daily.    Marland Kitchen triamcinolone cream (KENALOG) 0.1 % Apply 1 application topically 4 (four) times daily. 30 g 0  . levofloxacin (LEVAQUIN) 500 MG tablet Take 1 tablet (500 mg total) by mouth daily. Take for 2 weeks. 14 tablet 0   No current facility-administered medications for this visit.    OBJECTIVE: Filed Vitals:   04/29/15 1048  BP: 111/67  Pulse: 84  Temp: 95 F (35 C)  Resp: 16     Body mass index is 14.49 kg/(m^2).    ECOG FS:0 - Asymptomatic  General: Thin, no acute distress. Eyes: Pink conjunctiva, anicteric sclera. Lungs: Clear to auscultation bilaterally. Heart: Regular rate and rhythm. No rubs, murmurs, or gallops. Abdomen: Soft, nontender, nondistended. No organomegaly noted, normoactive bowel sounds. Musculoskeletal: No edema, cyanosis, or clubbing. Neuro: Alert, answering all questions appropriately. Cranial nerves grossly intact. Skin: No rashes or petechiae noted. Psych: Normal affect.   LAB RESULTS:  Lab Results  Component Value Date   NA 133* 01/17/2015   K 3.8  01/17/2015   CL 96* 01/17/2015   CO2 30 01/17/2015   GLUCOSE 95 01/17/2015   BUN 12 01/17/2015   CREATININE 0.70 04/22/2015   CALCIUM 8.9 01/17/2015   PROT 8.1 01/17/2015   ALBUMIN 3.7 01/17/2015   AST 21 01/17/2015   ALT 18 01/17/2015   ALKPHOS 79 01/17/2015   BILITOT 0.6 01/17/2015   GFRNONAA >60 01/17/2015   GFRAA >60 01/17/2015    Lab Results  Component Value Date   WBC 8.7 01/17/2015   NEUTROABS 5.3 01/17/2015   HGB 14.3 01/17/2015   HCT 43.8 01/17/2015   MCV 81.1 01/17/2015   PLT 345 01/17/2015     STUDIES: Ct Chest W Contrast  04/22/2015  CLINICAL DATA:  Followup pulmonary nodule EXAM: CT CHEST WITH CONTRAST TECHNIQUE: Multidetector CT imaging of the chest was performed during intravenous contrast administration. CONTRAST:  42m ISOVUE-300 IOPAMIDOL (ISOVUE-300) INJECTION 61% COMPARISON:  12/04/2014 and 11/21/2013 FINDINGS: Mediastinum/Lymph Nodes: Heart size is normal. Aortic atherosclerosis noted. The trachea appears patent and is midline. Aortic atherosclerosis is noted. Sub- carinal lymph node measures 0.9 cm, image 82 series 2. Previously 0.7 cm. Lungs/Pleura: Chronic pleural thickening and calcification overlying the posterior right lower lobe is identified. There is dense airspace consolidation identified within the posterior lower lobes right greater than left. In the areas of consolidation within the periphery of the right lower lobe there are central areas of low attenuation which may represent necrosis. Additionally, there are is ground-glass attenuation and interstitial thickening. There are multiple areas of bronchial impaction identified bilaterally which is concerning for aspiration. In the left upper lobe there is a 9 x 1.3 mm nodule (11 mm mean diameter) on image 35 of series 3. On the study from 12/04/2014 this had a mean diameter of 10 mm. Advanced changes of centrilobular and paraseptal emphysema. Upper abdomen: These are too small to characterize. There is a  nodule within the left adrenal gland which appears unchanged from previous exam measuring 9 mm, image 163 of series 2. Musculoskeletal: No aggressive lytic or sclerotic bone lesions. IMPRESSION: 1. New bilateral areas of dense airspace consolidation and bronchial impaction within both lower lobes. In the acute setting findings may to represent aspiration and/or pneumonia. Followup imaging is recommended in 3-4 weeks following trial of antibiotic therapy to ensure resolution and exclude underlying malignancy. 2. Stable to minimal increase in size of left upper lobe spiculated nodule previously demonstrating borderline malignant range FDG uptake. Cannot rule out slow-growing indolent neoplasm in this patient who has extensive smoking related changes throughout both lungs and a prior history of lung cancer. 3. Mild increase in size of sub- carinal lymph node which may be reactive in the setting of pneumonia. 4. Aortic atherosclerosis. Electronically Signed   By: TKerby MoorsM.D.   On: 04/22/2015 08:13    ASSESSMENT: Bilateral, PET positive pulmonary nodules suspicious for underlying malignancy.  PLAN:    1. Pulmonary nodules: CT scan results reviewed independently and reported as above with minimal increase in size of left upper lobe  spiculated nodule which is highly suspicious for underlying malignancy. Will continue to monitor patient with periodic CT scans. Given patient's poor lung function, he is at high risk for pneumothorax if biopsy were attempted. Return to clinic in 4 months for repeat imaging and further evaluation. 2. Airspace consolidation: Will give patient a 14 day course of Levaquin.    Patient expressed understanding and was in agreement with this plan. He also understands that He can call clinic at any time with any questions, concerns, or complaints.    Lloyd Huger, MD   04/29/2015 11:32 AM

## 2015-04-29 NOTE — Progress Notes (Signed)
Patient is here to discuss test results and does not offer any problems.

## 2015-05-19 ENCOUNTER — Inpatient Hospital Stay
Admission: EM | Admit: 2015-05-19 | Discharge: 2015-05-22 | DRG: 190 | Disposition: A | Discharge status: Home / Self Care | Payer: PPO | Attending: Internal Medicine | Admitting: Internal Medicine

## 2015-05-19 ENCOUNTER — Emergency Department: Payer: PPO

## 2015-05-19 ENCOUNTER — Encounter: Payer: Self-pay | Admitting: *Deleted

## 2015-05-19 DIAGNOSIS — Z7951 Long term (current) use of inhaled steroids: Secondary | ICD-10-CM

## 2015-05-19 DIAGNOSIS — Z833 Family history of diabetes mellitus: Secondary | ICD-10-CM

## 2015-05-19 DIAGNOSIS — Z79899 Other long term (current) drug therapy: Secondary | ICD-10-CM

## 2015-05-19 DIAGNOSIS — E43 Unspecified severe protein-calorie malnutrition: Secondary | ICD-10-CM | POA: Diagnosis present

## 2015-05-19 DIAGNOSIS — I959 Hypotension, unspecified: Secondary | ICD-10-CM | POA: Diagnosis not present

## 2015-05-19 DIAGNOSIS — R64 Cachexia: Secondary | ICD-10-CM | POA: Diagnosis not present

## 2015-05-19 DIAGNOSIS — I10 Essential (primary) hypertension: Secondary | ICD-10-CM | POA: Diagnosis present

## 2015-05-19 DIAGNOSIS — E876 Hypokalemia: Secondary | ICD-10-CM | POA: Diagnosis not present

## 2015-05-19 DIAGNOSIS — Z681 Body mass index (BMI) 19 or less, adult: Secondary | ICD-10-CM

## 2015-05-19 DIAGNOSIS — R627 Adult failure to thrive: Secondary | ICD-10-CM | POA: Diagnosis present

## 2015-05-19 DIAGNOSIS — J440 Chronic obstructive pulmonary disease with acute lower respiratory infection: Principal | ICD-10-CM | POA: Diagnosis present

## 2015-05-19 DIAGNOSIS — Z8249 Family history of ischemic heart disease and other diseases of the circulatory system: Secondary | ICD-10-CM

## 2015-05-19 DIAGNOSIS — D649 Anemia, unspecified: Secondary | ICD-10-CM | POA: Diagnosis not present

## 2015-05-19 DIAGNOSIS — R0602 Shortness of breath: Secondary | ICD-10-CM | POA: Diagnosis not present

## 2015-05-19 DIAGNOSIS — Z85118 Personal history of other malignant neoplasm of bronchus and lung: Secondary | ICD-10-CM | POA: Diagnosis not present

## 2015-05-19 DIAGNOSIS — R918 Other nonspecific abnormal finding of lung field: Secondary | ICD-10-CM | POA: Diagnosis not present

## 2015-05-19 DIAGNOSIS — J189 Pneumonia, unspecified organism: Secondary | ICD-10-CM | POA: Diagnosis not present

## 2015-05-19 DIAGNOSIS — J449 Chronic obstructive pulmonary disease, unspecified: Secondary | ICD-10-CM | POA: Diagnosis not present

## 2015-05-19 DIAGNOSIS — D72829 Elevated white blood cell count, unspecified: Secondary | ICD-10-CM | POA: Diagnosis not present

## 2015-05-19 DIAGNOSIS — C3490 Malignant neoplasm of unspecified part of unspecified bronchus or lung: Secondary | ICD-10-CM | POA: Diagnosis not present

## 2015-05-19 DIAGNOSIS — F17210 Nicotine dependence, cigarettes, uncomplicated: Secondary | ICD-10-CM | POA: Diagnosis present

## 2015-05-19 HISTORY — DX: Other nonspecific abnormal finding of lung field: R91.8

## 2015-05-19 LAB — CBC WITH DIFFERENTIAL/PLATELET
BASOS ABS: 0 10*3/uL (ref 0–0.1)
Basophils Relative: 0 %
Eosinophils Absolute: 0 10*3/uL (ref 0–0.7)
HEMATOCRIT: 37.8 % — AB (ref 40.0–52.0)
Hemoglobin: 12 g/dL — ABNORMAL LOW (ref 13.0–18.0)
Lymphs Abs: 2.2 10*3/uL (ref 1.0–3.6)
MCH: 24.9 pg — AB (ref 26.0–34.0)
MCHC: 31.7 g/dL — AB (ref 32.0–36.0)
MCV: 78.6 fL — AB (ref 80.0–100.0)
MONO ABS: 0.8 10*3/uL (ref 0.2–1.0)
Neutro Abs: 11.2 10*3/uL — ABNORMAL HIGH (ref 1.4–6.5)
Neutrophils Relative %: 79 %
PLATELETS: 507 10*3/uL — AB (ref 150–440)
RBC: 4.8 MIL/uL (ref 4.40–5.90)
RDW: 14.9 % — AB (ref 11.5–14.5)
WBC: 14.3 10*3/uL — ABNORMAL HIGH (ref 3.8–10.6)

## 2015-05-19 LAB — COMPREHENSIVE METABOLIC PANEL
ALT: 48 U/L (ref 17–63)
ANION GAP: 10 (ref 5–15)
AST: 55 U/L — AB (ref 15–41)
Albumin: 2.1 g/dL — ABNORMAL LOW (ref 3.5–5.0)
Alkaline Phosphatase: 119 U/L (ref 38–126)
BILIRUBIN TOTAL: 0.6 mg/dL (ref 0.3–1.2)
BUN: 16 mg/dL (ref 6–20)
CHLORIDE: 97 mmol/L — AB (ref 101–111)
CO2: 28 mmol/L (ref 22–32)
Calcium: 8.3 mg/dL — ABNORMAL LOW (ref 8.9–10.3)
Creatinine, Ser: 0.61 mg/dL (ref 0.61–1.24)
Glucose, Bld: 126 mg/dL — ABNORMAL HIGH (ref 65–99)
POTASSIUM: 3.6 mmol/L (ref 3.5–5.1)
Sodium: 135 mmol/L (ref 135–145)
TOTAL PROTEIN: 7.3 g/dL (ref 6.5–8.1)

## 2015-05-19 LAB — TROPONIN I

## 2015-05-19 MED ORDER — SODIUM CHLORIDE 0.9 % IV BOLUS (SEPSIS)
1000.0000 mL | Freq: Once | INTRAVENOUS | Status: AC
  Administered 2015-05-19: 1000 mL via INTRAVENOUS

## 2015-05-19 MED ORDER — VANCOMYCIN HCL IN DEXTROSE 750-5 MG/150ML-% IV SOLN
750.0000 mg | Freq: Once | INTRAVENOUS | Status: AC
Start: 2015-05-19 — End: 2015-05-19
  Administered 2015-05-19: 17:00:00 750 mg via INTRAVENOUS
  Filled 2015-05-19: qty 150

## 2015-05-19 MED ORDER — NICOTINE 14 MG/24HR TD PT24
14.0000 mg | MEDICATED_PATCH | Freq: Every day | TRANSDERMAL | Status: DC
  Filled 2015-05-19: qty 1

## 2015-05-19 MED ORDER — ACETAMINOPHEN 650 MG RE SUPP: 650.0000 mg | Freq: Four times a day (QID) | RECTAL | Status: DC | PRN

## 2015-05-19 MED ORDER — ENOXAPARIN SODIUM 40 MG/0.4ML ~~LOC~~ SOLN
40.0000 mg | SUBCUTANEOUS | Status: DC
  Administered 2015-05-19: 40 mg via SUBCUTANEOUS
  Filled 2015-05-19: qty 0.4

## 2015-05-19 MED ORDER — ACETAMINOPHEN 325 MG PO TABS: 650.0000 mg | ORAL_TABLET | Freq: Four times a day (QID) | ORAL | Status: DC | PRN

## 2015-05-19 MED ORDER — MEGESTROL ACETATE 400 MG/10ML PO SUSP
400.0000 mg | Freq: Every day | ORAL | Status: DC
  Administered 2015-05-19 – 2015-05-22 (×3): 400 mg via ORAL
  Filled 2015-05-19 (×4): qty 10

## 2015-05-19 MED ORDER — DEXTROSE 5 % IV SOLN
1.0000 g | Freq: Once | INTRAVENOUS | Status: AC
  Administered 2015-05-19: 1 g via INTRAVENOUS
  Filled 2015-05-19 (×2): qty 10

## 2015-05-19 MED ORDER — ONDANSETRON HCL 4 MG PO TABS: 4.0000 mg | ORAL_TABLET | Freq: Four times a day (QID) | ORAL | Status: DC | PRN

## 2015-05-19 MED ORDER — NICOTINE 14 MG/24HR TD PT24
14.0000 mg | MEDICATED_PATCH | Freq: Every day | TRANSDERMAL | Status: DC
  Filled 2015-05-19 (×2): qty 1

## 2015-05-19 MED ORDER — VANCOMYCIN HCL 500 MG IV SOLR
500.0000 mg | INTRAVENOUS | Status: DC
  Administered 2015-05-20 – 2015-05-21 (×2): 500 mg via INTRAVENOUS
  Filled 2015-05-19 (×2): qty 500

## 2015-05-19 MED ORDER — ONDANSETRON HCL 4 MG/2ML IJ SOLN: 4.0000 mg | Freq: Four times a day (QID) | INTRAMUSCULAR | Status: DC | PRN

## 2015-05-19 MED ORDER — ENOXAPARIN SODIUM 40 MG/0.4ML ~~LOC~~ SOLN
40.0000 mg | SUBCUTANEOUS | Status: DC
  Filled 2015-05-19: qty 0.4

## 2015-05-19 MED ORDER — DEXTROSE 5 % IV SOLN
500.0000 mg | Freq: Once | INTRAVENOUS | Status: AC
  Administered 2015-05-19: 500 mg via INTRAVENOUS
  Filled 2015-05-19: qty 500

## 2015-05-19 MED ORDER — PIPERACILLIN-TAZOBACTAM 3.375 G IVPB 30 MIN
3.3750 g | Freq: Once | INTRAVENOUS | Status: DC
  Filled 2015-05-19: qty 50

## 2015-05-19 MED ORDER — SODIUM CHLORIDE 0.9 % IV SOLN
INTRAVENOUS | Status: DC
  Administered 2015-05-19 – 2015-05-20 (×4): via INTRAVENOUS

## 2015-05-19 MED ORDER — MOMETASONE FURO-FORMOTEROL FUM 200-5 MCG/ACT IN AERO
2.0000 | INHALATION_SPRAY | Freq: Two times a day (BID) | RESPIRATORY_TRACT | Status: DC
  Administered 2015-05-19 – 2015-05-22 (×6): 2 via RESPIRATORY_TRACT
  Filled 2015-05-19: qty 8.8

## 2015-05-19 MED ORDER — ASPIRIN EC 81 MG PO TBEC
81.0000 mg | DELAYED_RELEASE_TABLET | Freq: Every day | ORAL | Status: DC
  Administered 2015-05-19 – 2015-05-22 (×4): 81 mg via ORAL
  Filled 2015-05-19 (×4): qty 1

## 2015-05-19 MED ORDER — IPRATROPIUM-ALBUTEROL 0.5-2.5 (3) MG/3ML IN SOLN: 3.0000 mL | Freq: Four times a day (QID) | RESPIRATORY_TRACT | Status: DC | PRN

## 2015-05-19 MED ORDER — IOPAMIDOL (ISOVUE-370) INJECTION 76%
60.0000 mL | Freq: Once | INTRAVENOUS | Status: AC | PRN
  Administered 2015-05-19: 60 mL via INTRAVENOUS

## 2015-05-19 MED ORDER — POLYETHYLENE GLYCOL 3350 17 G PO PACK: 17.0000 g | PACK | Freq: Every day | ORAL | Status: DC | PRN

## 2015-05-19 MED ORDER — PIPERACILLIN-TAZOBACTAM 3.375 G IVPB
3.3750 g | Freq: Three times a day (TID) | INTRAVENOUS | Status: DC
  Administered 2015-05-19 – 2015-05-22 (×8): 3.375 g via INTRAVENOUS
  Filled 2015-05-19 (×10): qty 50

## 2015-05-19 MED ORDER — TIOTROPIUM BROMIDE MONOHYDRATE 18 MCG IN CAPS
18.0000 ug | ORAL_CAPSULE | Freq: Every day | RESPIRATORY_TRACT | Status: DC
  Administered 2015-05-20 – 2015-05-22 (×3): 18 ug via RESPIRATORY_TRACT
  Filled 2015-05-19: qty 5

## 2015-05-19 MED ORDER — DOCUSATE SODIUM 100 MG PO CAPS
100.0000 mg | ORAL_CAPSULE | Freq: Two times a day (BID) | ORAL | Status: DC
  Administered 2015-05-19: 100 mg via ORAL
  Filled 2015-05-19 (×3): qty 1

## 2015-05-19 MED ORDER — TAMSULOSIN HCL 0.4 MG PO CAPS
0.4000 mg | ORAL_CAPSULE | Freq: Every day | ORAL | Status: DC
  Administered 2015-05-19 – 2015-05-22 (×4): 0.4 mg via ORAL
  Filled 2015-05-19 (×4): qty 1

## 2015-05-19 NOTE — Progress Notes (Signed)
Pharmacy Antibiotic Note  Rick Clark is a 70 y.o. male admitted on 05/19/2015 with pneumonia.  Pharmacy has been consulted for vancomycin & piperacillin/tazobactam dosing.  Plan: Piperacillin/tazobactam 3.375 g IV q8h EI  Vancomycin 500 mg IV daily (stacked dose to begin 11 hrs after initial dose of 750 mg) Goal vancomycin level 15-20 mcg/mL Vancomycin trough ordered for 5/10 @ 0430  Kinetics: Using Actual body weight of 40 kg Ke: 0.044 Half-life: ~16 hrs Vd: 28L  Height: '5\' 5"'$  (165.1 cm) Weight: 89 lb (40.37 kg) IBW/kg (Calculated) : 61.5  Temp (24hrs), Avg:98.1 F (36.7 C), Min:97.8 F (36.6 C), Max:98.4 F (36.9 C)   Recent Labs Lab 05/19/15 1132  WBC 14.3*  CREATININE 0.61    Estimated Creatinine Clearance: 48.4 mL/min (by C-G formula based on Cr of 0.61).    No Known Allergies  Antimicrobials this admission: Azithromycin & CTX 5/7 one dose vancomycin 5/7 >>  Piperacillin/tazobactam 5/7 >>  Dose adjustments this admission:  Microbiology results: 5/7 BCx: Sent  Thank you for allowing pharmacy to be a part of this patient's care.  Lenis Noon, PharmD Clinical Pharmacist 05/19/2015 7:53 PM

## 2015-05-19 NOTE — ED Provider Notes (Signed)
[facility] Emergency Department Provider Note   ____________________________________________  Time seen: Approximately 11:39 AM  I have reviewed the triage vital signs and the nursing notes.   HISTORY  Chief Complaint Shortness of Breath    HPI Rick Clark is a 70 y.o. male with history of COPD, history of lung cancer treated in 1990s who presents for evaluation one month of worsening shortness of breath and cough, gradual onset, constant since onset, currently severe, no modifying factors. Patient was seen by Dr. Grayland Ormond on 04/13/2015 after having a CT scan that showed possible pneumonia as well as a possible lung cancer. He was prescribed Levaquin and was supposed to follow-up with Dr. Grayland Ormond. It is unclear whether not he asked we took the [place]. He has had persistently worsening shortness of breath, chest tightness today. He has been losing weight and has had poor appetite. He denies any fevers but has had chills.   Past Medical History  Diagnosis Date  . Pneumonia Nov '15  . COPD (chronic obstructive pulmonary disease) ([place])   . Shortness of breath dyspnea   . Lung cancer [facility]) 1990    Patient states RUL Thoracotomy.  . Lung mass     new spiculated LUL lung mass    Patient Active Problem List   Diagnosis Date Noted  . Protein-calorie malnutrition, severe ([place]) 06/24/2014  . Parapneumonic effusion 06/23/2014  . Failure to thrive in adult 06/23/2014  . Pneumonia 06/22/2014    Past Surgical History  Procedure Laterality Date  . Lung lobectomy Right     Right upper lobe  . Flexible bronchoscopy Bilateral 09/18/2014    Procedure: FLEXIBLE BRONCHOSCOPY;  Surgeon: Allyne Gee, MD;  Location: [facility] ORS;  Service: Pulmonary;  Laterality: Bilateral;    Current Outpatient Rx  Name  Route  Sig  Dispense  Refill  . albuterol (PROVENTIL HFA;VENTOLIN HFA) 108 (90 BASE) MCG/ACT inhaler   Inhalation   Inhale 1 puff into the lungs every 6  (six) hours as needed for wheezing or shortness of breath.         Marland Kitchen aspirin 81 MG chewable tablet   Oral   Chew 81 mg by mouth every morning.         . budesonide-formoterol (SYMBICORT) 160-4.5 MCG/ACT inhaler   Inhalation   Inhale 2 puffs into the lungs 2 (two) times daily.         Marland Kitchen tiotropium (SPIRIVA) 18 MCG inhalation capsule   Inhalation   Place 18 mcg into inhaler and inhale daily.         Marland Kitchen levofloxacin (LEVAQUIN) 500 MG tablet   Oral   Take 1 tablet (500 mg total) by mouth daily. Take for 2 weeks. Patient not taking: Reported on 05/19/2015   14 tablet   0     Allergies Review of patient's allergies indicates no known allergies.  Family History  Problem Relation Age of Onset  . Coronary artery disease Father   . Diabetes Mother     Social History Social History  Substance Use Topics  . Smoking status: Current Every Day Smoker -- 0.50 packs/day for 55 years    Types: Cigarettes  . Smokeless tobacco: Never Used  . Alcohol Use: No    Review of Systems Constitutional: No fever, +chills Eyes: No visual changes. ENT: No sore throat. Cardiovascular: +chest pain. Respiratory: +shortness of breath. Gastrointestinal: No abdominal pain.  No nausea, no vomiting.  No diarrhea.  No constipation. Genitourinary: Negative for dysuria.  Musculoskeletal: Negative for back pain. Skin: Negative for rash. Neurological: Negative for headaches, focal weakness or numbness.  10-point ROS otherwise negative.  ____________________________________________   PHYSICAL EXAM:  Filed Vitals:   05/19/15 1347 05/19/15 1347 05/19/15 1348 05/19/15 1349  BP:  98/58    Pulse:  76 77 77  Temp:      TempSrc:      Resp: '28 24 30 22  '$ Height:      Weight:      SpO2:  98% 99% 99%    VITAL SIGNS: ED Triage Vitals  Enc Vitals Group     BP 05/19/15 1123 104/61 mmHg     Pulse Rate 05/19/15 1123 93     Resp 05/19/15 1123 24     Temp 05/19/15 1123 97.8 F (36.6 C)     Temp  Source 05/19/15 1123 Oral     SpO2 05/19/15 1123 92 %     Weight 05/19/15 1123 89 lb (40.37 kg)     Height 05/19/15 1123 '5\' 5"'$  (1.651 m)     Head Cir --      Peak Flow --      Pain Score 05/19/15 1124 3     Pain Loc --      Pain Edu? --      Excl. in [place]? --     Constitutional: Alert and oriented. Chronically ill-appearing, cachectic older gentleman, with mild respiratory distress. Eyes: Conjunctivae are normal. PERRL. EOMI. Head: Atraumatic. Nose: No congestion/rhinnorhea. Mouth/Throat: Mucous membranes are moist.  Oropharynx non-erythematous. Neck: No stridor. Supple without meningismus. Cardiovascular: Normal rate, regular rhythm. Grossly normal heart sounds.  Good peripheral circulation. Respiratory: Tachypnea with mildly increased work of breathing. Decreased breath sounds throughout the right lung fields. Gastrointestinal: Soft and nontender. No distention. No CVA tenderness. Genitourinary: deferred Musculoskeletal: No lower extremity tenderness nor edema.  No joint effusions. Neurologic:  Normal speech and language. No gross focal neurologic deficits are appreciated.  Skin:  Skin is warm, dry and intact. No rash noted. Psychiatric: Mood and affect are normal. Speech and behavior are normal.  ____________________________________________   LABS (all labs ordered are listed, but only abnormal results are displayed)  Labs Reviewed  CBC WITH DIFFERENTIAL/PLATELET - Abnormal; Notable for the following:    WBC 14.3 (*)    Hemoglobin 12.0 (*)    HCT 37.8 (*)    MCV 78.6 (*)    MCH 24.9 (*)    MCHC 31.7 (*)    RDW 14.9 (*)    Platelets 507 (*)    Neutro Abs 11.2 (*)    All other components within normal limits  COMPREHENSIVE METABOLIC PANEL - Abnormal; Notable for the following:    Chloride 97 (*)    Glucose, Bld 126 (*)    Calcium 8.3 (*)    Albumin 2.1 (*)    AST 55 (*)    All other components within normal limits  CULTURE, BLOOD (ROUTINE X 2)  CULTURE, BLOOD  (ROUTINE X 2)  TROPONIN I   ____________________________________________  EKG  ED ECG REPORT I, Joanne Gavel, the attending physician, personally viewed and interpreted this ECG.   Date: 05/19/2015  EKG Time: 11:27  Rate: 93  Rhythm: sinus rhythm with PVCs and fusion complexes.  Axis: right  Intervals:none  ST&T Change: No acute ST elevation.  ____________________________________________  RADIOLOGY  CXR IMPRESSION: Widespread airspace consolidation persists on the right compared to approximately 1 month prior. The persistence of this consolidation may warrant bronchoscopy to further  evaluate.  Nodular lesion left upper lobe, concerning for neoplasm. This nodular lesion is stable compared to 1 month prior.  Underlying COPD. Postoperative change right lung. Stable cardiac [place].  CTA chest IMPRESSION: No evidence of pulmonary embolism.  Increased RIGHT lower lobe consolidation consistent with pneumonia.  Subtle areas of lower attenuation within the consolidated RIGHT lower lobe are of fluid attenuation, could represent lung necrosis or drowned lung but early abscess formation not excluded ; short-term follow-up CT recommended.  Severe COPD changes with minimal increase in size of a LEFT upper lobe nodule.  Slight increase in size of an azygoesophageal recess lymph node.   ____________________________________________   PROCEDURES  Procedure(s) performed: None  Critical Care performed: Yes, see critical care note(s). Total critical care time spent 35 minutes.  ____________________________________________   INITIAL IMPRESSION / ASSESSMENT AND PLAN / ED COURSE  Pertinent labs & imaging results that were available during my care of the patient were reviewed by me and considered in my medical decision making (see chart for details).  Rick Clark is a 70 y.o. male with history of COPD, history of lung cancer treated in 1990s who presents for  evaluation one month of worsening shortness of breath and cough. On exam, he is chronically ill-appearing, mildly tachypneic with slightly increased work of breathing and completely diminished breath sounds throughout the right lung fields. His vital signs are stable, he is afebrile. CBC shows leukocytosis, CMP generally unremarkable, negative troponin. Chest x-ray shows continued right-sided pneumonia and given persistence of the consolidation, bronchoscopy is warranted for further evaluation. He still has the nodular lesion in the left upper lobe which is concerning for neoplasm. Concern for post obstructive pneumonia, we'll give IV fluids, ceftriaxone, azithromycin. Will obtain CTA chest to rule out PE. Anticipate admission.  ----------------------------------------- 2:57 PM on 05/19/2015 ----------------------------------------- CTA chest shows no PE, pneumonia confirmed. Still with no oxygen requirement however he is tachypneic with a maximum respiratory rate of 36 therefore I discussed the case with the hospitalist, Dr. Dede Query, for admission at this time.  ____________________________________________   FINAL CLINICAL IMPRESSION(S) / ED DIAGNOSES  Final diagnoses:  CAP (community acquired pneumonia)      NEW MEDICATIONS STARTED DURING THIS VISIT:  New Prescriptions   No medications on file     Note:  This document was prepared using Dragon voice recognition software and may include unintentional dictation errors.    Joanne Gavel, MD 05/19/15 [phone]

## 2015-05-19 NOTE — ED Notes (Signed)
States feeling SOB, states he checked his o2 level at home and states it was 70%, states he was told he had pneumonia a few weeks ago but is unsure is he ever took abx, states lack of appetite, daughter with pt

## 2015-05-19 NOTE — H&P (Signed)
[facility] NAME: Rick Clark    MR#:  [id number]  DATE OF BIRTH:  [birth year]  DATE OF ADMISSION:  05/19/2015  PRIMARY CARE PHYSICIAN: [name], MD   REQUESTING/REFERRING PHYSICIAN:Dr. Loura Pardon  CHIEF COMPLAINT:   Chief Complaint  Patient presents with  . Shortness of Breath    HISTORY OF PRESENT ILLNESS:  Rick Clark  is a 70 y.o. male with a known history of COPD not on home oxygen, ongoing smoking, prior history of lung cancer status post right upper lobectomy in 1990, new left upper lobe spiculated lung mass being followed by oncology presents to the hospital from home secondary to worsening cough and lower oxygen saturations. Patient has been following up with Dr. Grayland Ormond for his left upper lobe mass and had a CT chest done last month. About 3 weeks ago when he saw Dr. Grayland Ormond he was started on 2 week course of Levaquin for right lower lobe pneumonia. Patient says he failed the medicine but couldn't swallow the pills as he had trouble swallowing pills in the past as well. So he did not take his antibiotic as prescribed. Been having worsening cough and feeling more short of breath than baseline and checked his oxygen saturations today at home and they were noted to be 70% on room air. He has been having extremely poor appetite, poor oral intake and weakness worsening over the last few months according to daughter. O2 saturations from his fingertips are not really able, he is saturating 100% with earlobe probe. CT of the chest in the emergency room showing worsening of his right lower lobe consolidation and possible underlying early abscess versus necrosis.  PAST MEDICAL HISTORY:   Past Medical History  Diagnosis Date  . Pneumonia Nov '15  . COPD (chronic obstructive pulmonary disease) ([place])   . Shortness of breath dyspnea   . Lung cancer [facility]) 1990    Patient states RUL Thoracotomy.  . Lung mass      new spiculated LUL lung mass    PAST SURGICAL HISTORY:   Past Surgical History  Procedure Laterality Date  . Lung lobectomy Right     Right upper lobe  . Flexible bronchoscopy Bilateral 09/18/2014    Procedure: FLEXIBLE BRONCHOSCOPY;  Surgeon: Allyne Gee, MD;  Location: [facility] ORS;  Service: Pulmonary;  Laterality: Bilateral;    SOCIAL HISTORY:   Social History  Substance Use Topics  . Smoking status: Current Every Day Smoker -- 0.50 packs/day for 55 years    Types: Cigarettes  . Smokeless tobacco: Never Used  . Alcohol Use: No    FAMILY HISTORY:   Family History  Problem Relation Age of Onset  . Coronary artery disease Father   . Diabetes Mother     DRUG ALLERGIES:  No Known Allergies  REVIEW OF SYSTEMS:   Review of Systems  Constitutional: Positive for weight loss and malaise/fatigue. Negative for fever and chills.  HENT: Negative for ear discharge, ear pain, nosebleeds and tinnitus.   Eyes: Negative for blurred vision, double vision and photophobia.  Respiratory: Positive for cough. Negative for hemoptysis, shortness of breath and wheezing.   Cardiovascular: Negative for chest pain, palpitations, orthopnea and leg swelling.  Gastrointestinal: Positive for nausea and abdominal pain. Negative for heartburn, vomiting, diarrhea, constipation and melena.  Genitourinary: Negative for dysuria.       Hesitancy  Musculoskeletal: Positive for myalgias. Negative for back pain and neck pain.  Skin: Negative  for rash.  Neurological: Negative for dizziness, tremors, sensory change, speech change, focal weakness and headaches.  Endo/Heme/Allergies: Does not bruise/bleed easily.  Psychiatric/Behavioral: Negative for depression.    MEDICATIONS AT HOME:   Prior to Admission medications   Medication Sig Start Date End Date Taking? Authorizing Provider  albuterol (PROVENTIL HFA;VENTOLIN HFA) 108 (90 BASE) MCG/ACT inhaler Inhale 1 puff into the lungs every 6 (six) hours as needed  for wheezing or shortness of breath.    Historical Provider, MD  aspirin 81 MG tablet Take 81 mg by mouth daily.    Historical Provider, MD  budesonide-formoterol (SYMBICORT) 160-4.5 MCG/ACT inhaler Inhale 2 puffs into the lungs 2 (two) times daily.    Historical Provider, MD  ibuprofen (ADVIL,MOTRIN) 200 MG tablet Take 400 mg by mouth every 6 (six) hours as needed for headache or mild pain.    Historical Provider, MD  levofloxacin (LEVAQUIN) 500 MG tablet Take 1 tablet (500 mg total) by mouth daily. Take for 2 weeks. 04/29/15   Lloyd Huger, MD  tiotropium (SPIRIVA) 18 MCG inhalation capsule Place 18 mcg into inhaler and inhale daily.    Historical Provider, MD  triamcinolone cream (KENALOG) 0.1 % Apply 1 application topically 4 (four) times daily. 01/24/15   Charline Bills Cuthriell, PA-C      VITAL SIGNS:  Blood pressure 98/58, pulse 77, temperature 97.8 F (36.6 C), temperature source Oral, resp. rate 22, height '5\' 5"'$  (1.651 m), weight 40.37 kg (89 lb), SpO2 99 %.  PHYSICAL EXAMINATION:   Physical Exam  GENERAL:  70 y.o.-year-old emaciated patient lying in the bed with no acute distress. cachetic EYES: Pupils equal, round, reactive to light and accommodation. No scleral icterus. Extraocular muscles intact.  HEENT: Head atraumatic, normocephalic. Oropharynx and nasopharynx clear.  NECK:  Supple, no jugular venous distention. No thyroid enlargement, no tenderness.  LUNGS: Normal breath sounds bilaterally, no wheezing, rales,rhonchi or crepitation. No use of accessory muscles of respiration. Scattered rhonchi at right base and decreased basilar breath sounds CARDIOVASCULAR: S1, S2 normal. No murmurs, rubs, or gallops.  ABDOMEN: Soft, nontender, nondistended. Bowel sounds present. No organomegaly or mass.  EXTREMITIES: No pedal edema, cyanosis, or clubbing.  NEUROLOGIC: Cranial nerves II through XII are intact. Muscle strength 5/5 in all extremities. Sensation intact. Gait not checked.   PSYCHIATRIC: The patient is alert and oriented x 3.  SKIN: No obvious rash, lesion, or ulcer.   LABORATORY PANEL:   CBC  Recent Labs Lab 05/19/15 1132  WBC 14.3*  HGB 12.0*  HCT 37.8*  PLT 507*   ------------------------------------------------------------------------------------------------------------------  Chemistries   Recent Labs Lab 05/19/15 1132  NA 135  K 3.6  CL 97*  CO2 28  GLUCOSE 126*  BUN 16  CREATININE 0.61  CALCIUM 8.3*  AST 55*  ALT 48  ALKPHOS 119  BILITOT 0.6   ------------------------------------------------------------------------------------------------------------------  Cardiac Enzymes  Recent Labs Lab 05/19/15 1132  TROPONINI <0.03   ------------------------------------------------------------------------------------------------------------------  RADIOLOGY:  Dg Chest 2 View  05/19/2015  CLINICAL DATA:  Shortness of breath with hypoxia. History of lung carcinoma EXAM: CHEST  2 VIEW COMPARISON:  Chest radiograph September 27, 2014; chest CT April 22, 2015 FINDINGS: There is underlying COPD. There is extensive airspace consolidation throughout the posterior and superior segments of the right lower lobe. There is a persistent nodular lesion in the periphery of the left upper lobe measuring 1.1 x 1.0 cm, not appreciably changed from recent CT. Heart size is normal. Pulmonary vascularity is normal. There is postoperative  change on the right. No adenopathy is appreciable by radiography. No bone lesions. IMPRESSION: Widespread airspace consolidation persists on the right compared to approximately 1 month prior. The persistence of this consolidation may warrant bronchoscopy to further evaluate. Nodular lesion left upper lobe, concerning for neoplasm. This nodular lesion is stable compared to 1 month prior. Underlying COPD. Postoperative change right lung. Stable cardiac silhouette. Electronically Signed   By: Lowella Grip III M.D.   On:  05/19/2015 12:11   Ct Angio Chest Pe W/cm &/or Wo Cm  05/19/2015  CLINICAL DATA:  Shortness of breath, hypoxemia, 70% saturation at home, recent pneumonia, history lung cancer post RIGHT upper lobectomy, COPD, smoker EXAM: CT ANGIOGRAPHY CHEST WITH CONTRAST TECHNIQUE: Multidetector CT imaging of the chest was performed using the standard protocol during bolus administration of intravenous contrast. Multiplanar CT image reconstructions and MIPs were obtained to evaluate the vascular anatomy. CONTRAST:  60 cc Isovue 370 IV COMPARISON:  04/22/2015 FINDINGS: Scattered atherosclerotic calcifications aorta, proximal great vessels, and coronary arteries. Aorta normal caliber without aneurysm or dissection. 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 Pulmonary arteries patent. No evidence of pulmonary embolism. Visualized upper abdomen grossly unremarkable. Enlarged as ago esophageal recess lymph node 12 mm short axis previously 9 mm. Abnormal soft tissue likely lymph nodes at inferior LEFT frontal as well, increased. Opacification of the LEFT lower lobe bronchus bite mucus or fluid with complete LEFT lower lobe atelectasis. Severe underlying emphysematous changes. Nodule with spiculated margins at lateral LEFT upper lobe 14 x 9 mm, previously 12 x 9 mm. Significantly increased opacification in the RIGHT lower lobe which may represent pneumonia. Several areas of fluid attenuation are seen within the consolidated RIGHT lower lobe, question lung necrosis or drowned lung but cannot completely exclude developing lung abscess formation, with largest questioned collection 2.0 cm greatest size image 158. Persistent RIGHT apex scarring. No pleural effusion or pneumothorax. No acute osseous findings. Review of the MIP images confirms the above findings. IMPRESSION: No evidence of pulmonary embolism. Increased RIGHT lower lobe consolidation consistent with pneumonia. Subtle areas of lower attenuation  within the consolidated RIGHT lower lobe are of fluid attenuation, could represent lung necrosis or drowned lung but early abscess formation not excluded ; short-term follow-up CT recommended. Severe COPD changes with minimal increase in size of a LEFT upper lobe nodule. Slight increase in size of an azygoesophageal recess lymph node. Electronically Signed   By: Lavonia Dana M.D.   On: 05/19/2015 14:38    EKG:   Orders placed or performed during the hospital encounter of 05/19/15  . EKG 12-Lead  . EKG 12-Lead    IMPRESSION AND PLAN:   Rick Clark  is a 70 y.o. male with a known history of COPD not on home oxygen, ongoing smoking, prior history of lung cancer status post right upper lobectomy in 1990, new left upper lobe spiculated lung mass being followed by oncology presents to the hospital from home secondary to worsening cough and lower oxygen saturations.  #1 CAP- prescribed outpatient levaquin, but patient has trouble swallowing pills and did not take the antibiotic. -CT of the chest revealing worsening pneumonia with possible underlying abscess. -Start with vancomycin and Zosyn. -Follow blood cultures. -Oxygen support as needed. Patient not on home oxygen. -History nurse are not reliable for checking oxygen saturations due to thick nails. Please check O2 sats from his ear lobe.  #2 COPD-stable. No indication for systemic steroids. -Nebs when necessary. Continue home inhalers. -O2 sats as  necessary.  #3 spiculated left upper lobe lung mass-following with oncology as outpatient. -No immediate need for biopsy due to risk for pneumothorax as he had in the past. -Further management per oncology. Last CT was on 04/22/2015 and had another CT scan done today.  #4 tobacco use disorder-counseled against smoking for almost 3 minutes. Started on nicotine patch.  #5 failure to thrive-severe protein calorie malnutrition noted. -Started on Megace. Dietitian consult   Physical therapy  consult prior to discharge.   All the records are reviewed and case discussed with ED provider. Management plans discussed with the patient, family and they are in agreement.  CODE STATUS: Full Code  TOTAL TIME TAKING CARE OF THIS PATIENT: 50 minutes.    Gladstone Lighter M.D on 05/19/2015 at 3:21 PM  Between 7am to 6pm - Pager - [phone]  After 6pm go to [email or web address] - password [email or web address] [facility]  Office  [phone]  CC: Primary care physician; [name], MD

## 2015-05-20 DIAGNOSIS — D72829 Elevated white blood cell count, unspecified: Secondary | ICD-10-CM

## 2015-05-20 DIAGNOSIS — Z79899 Other long term (current) drug therapy: Secondary | ICD-10-CM

## 2015-05-20 DIAGNOSIS — R531 Weakness: Secondary | ICD-10-CM

## 2015-05-20 DIAGNOSIS — F17210 Nicotine dependence, cigarettes, uncomplicated: Secondary | ICD-10-CM

## 2015-05-20 DIAGNOSIS — D649 Anemia, unspecified: Secondary | ICD-10-CM

## 2015-05-20 DIAGNOSIS — J189 Pneumonia, unspecified organism: Secondary | ICD-10-CM

## 2015-05-20 DIAGNOSIS — R918 Other nonspecific abnormal finding of lung field: Secondary | ICD-10-CM

## 2015-05-20 DIAGNOSIS — J449 Chronic obstructive pulmonary disease, unspecified: Secondary | ICD-10-CM | POA: Diagnosis not present

## 2015-05-20 DIAGNOSIS — C3490 Malignant neoplasm of unspecified part of unspecified bronchus or lung: Secondary | ICD-10-CM | POA: Diagnosis not present

## 2015-05-20 DIAGNOSIS — R627 Adult failure to thrive: Secondary | ICD-10-CM | POA: Diagnosis not present

## 2015-05-20 LAB — BASIC METABOLIC PANEL
Anion gap: 6 (ref 5–15)
BUN: 15 mg/dL (ref 6–20)
CALCIUM: 7.5 mg/dL — AB (ref 8.9–10.3)
CHLORIDE: 103 mmol/L (ref 101–111)
CO2: 28 mmol/L (ref 22–32)
CREATININE: 0.65 mg/dL (ref 0.61–1.24)
GFR calc non Af Amer: >60 mL/min (ref >60)
GLUCOSE: 103 mg/dL — AB (ref 65–99)
Potassium: 2.9 mmol/L — CL (ref 3.5–5.1)
Sodium: 137 mmol/L (ref 135–145)

## 2015-05-20 LAB — CBC
HCT: 31.5 % — ABNORMAL LOW (ref 40.0–52.0)
Hemoglobin: 10.3 g/dL — ABNORMAL LOW (ref 13.0–18.0)
MCH: 25.4 pg — AB (ref 26.0–34.0)
MCHC: 32.7 g/dL (ref 32.0–36.0)
MCV: 77.7 fL — AB (ref 80.0–100.0)
PLATELETS: 395 10*3/uL (ref 150–440)
RBC: 4.05 MIL/uL — AB (ref 4.40–5.90)
RDW: 14.7 % — AB (ref 11.5–14.5)
WBC: 13.4 10*3/uL — ABNORMAL HIGH (ref 3.8–10.6)

## 2015-05-20 LAB — POTASSIUM
POTASSIUM: 3.3 mmol/L — AB (ref 3.5–5.1)
Potassium: 3.1 mmol/L — ABNORMAL LOW (ref 3.5–5.1)

## 2015-05-20 LAB — MAGNESIUM: MAGNESIUM: 1.7 mg/dL (ref 1.7–2.4)

## 2015-05-20 MED ORDER — POTASSIUM CHLORIDE CRYS ER 20 MEQ PO TBCR: 40.0000 meq | EXTENDED_RELEASE_TABLET | Freq: Once | ORAL | Status: DC

## 2015-05-20 MED ORDER — ENSURE ENLIVE PO LIQD
237.0000 mL | Freq: Three times a day (TID) | ORAL | Status: DC
Start: 2015-05-20 — End: 2015-05-22
  Administered 2015-05-20 – 2015-05-22 (×4): 237 mL via ORAL

## 2015-05-20 MED ORDER — ENOXAPARIN SODIUM 30 MG/0.3ML ~~LOC~~ SOLN
30.0000 mg | SUBCUTANEOUS | Status: DC
  Administered 2015-05-20 – 2015-05-21 (×2): 30 mg via SUBCUTANEOUS
  Filled 2015-05-20 (×2): qty 0.3

## 2015-05-20 MED ORDER — POTASSIUM CHLORIDE CRYS ER 20 MEQ PO TBCR: 20.0000 meq | EXTENDED_RELEASE_TABLET | Freq: Three times a day (TID) | ORAL | Status: DC

## 2015-05-20 MED ORDER — SODIUM CHLORIDE 0.9 % IV BOLUS (SEPSIS)
250.0000 mL | Freq: Once | INTRAVENOUS | Status: AC
Start: 2015-05-20 — End: 2015-05-20
  Administered 2015-05-20: 250 mL via INTRAVENOUS

## 2015-05-20 MED ORDER — MAGNESIUM SULFATE 2 GM/50ML IV SOLN
2.0000 g | Freq: Once | INTRAVENOUS | Status: AC
  Administered 2015-05-20: 2 g via INTRAVENOUS
  Filled 2015-05-20: qty 50

## 2015-05-20 MED ORDER — CETYLPYRIDINIUM CHLORIDE 0.05 % MT LIQD
7.0000 mL | Freq: Two times a day (BID) | OROMUCOSAL | Status: DC
  Administered 2015-05-20 – 2015-05-22 (×4): 7 mL via OROMUCOSAL

## 2015-05-20 MED ORDER — POTASSIUM CHLORIDE 10 MEQ/100ML IV SOLN
10.0000 meq | INTRAVENOUS | Status: AC
  Administered 2015-05-20 (×3): 10 meq via INTRAVENOUS
  Filled 2015-05-20 (×3): qty 100

## 2015-05-20 MED ORDER — POTASSIUM CHLORIDE 10 MEQ/100ML IV SOLN
10.0000 meq | INTRAVENOUS | Status: AC
Start: 2015-05-20 — End: 2015-05-21
  Administered 2015-05-20 – 2015-05-21 (×4): 10 meq via INTRAVENOUS
  Filled 2015-05-20 (×4): qty 100

## 2015-05-20 MED ORDER — POTASSIUM CHLORIDE 10 MEQ/100ML IV SOLN
10.0000 meq | INTRAVENOUS | Status: DC
  Administered 2015-05-20: 10 meq via INTRAVENOUS
  Filled 2015-05-20 (×4): qty 100

## 2015-05-20 NOTE — Progress Notes (Addendum)
Physical Therapy Evaluation Patient Details Name: Rick Clark MRN: [id number] DOB: 1945/04/12 Today's Date: 05/20/2015   History of Present Illness  Rick Clark is a 70 y.o. male with a known history of COPD not on home oxygen, ongoing smoking, prior history of lung cancer status post right upper lobectomy in 1990, new left upper lobe spiculated lung mass being followed by oncology presents to the hospital from home secondary to worsening cough and lower oxygen saturations.  Clinical Impression  Pt presents to PT this date below baseline with decreased strength and endurance.  Pt required CGA for bed mobility supine to sit and demonstrated decreased muscle power with transfers and gait.  Pt able to ambulate 200 feet without device and endorses fatigue at end of session.  BP 89/47 at end of session.  Pt would benefit from acute PT services to address objective findings.    Follow Up Recommendations No PT follow up    Equipment Recommendations  None recommended by PT    Recommendations for Other Services       Precautions / Restrictions Precautions Precautions: Fall Precaution Comments: MOD Restrictions Weight Bearing Restrictions: No      Mobility  Bed Mobility Overal bed mobility: Needs Assistance Bed Mobility: Supine to Sit     Supine to sit: Min guard     General bed mobility comments: lifts trunk up onto elbows, CGA to initiate rise; independent with scooting  Transfers Overall transfer level: Modified independent Equipment used: None             General transfer comment: Sit<>stand with Mod I  Ambulation/Gait Ambulation/Gait assistance: Supervision Ambulation Distance (Feet): 200 Feet Assistive device: None Gait Pattern/deviations: Step-through pattern   Gait velocity interpretation: Below normal speed for age/gender General Gait Details: Steady gait with no balance deviations, slight SOB at end of session with O2 sats at 92%  Stairs             Wheelchair Mobility    Modified Rankin (Stroke Patients Only)       Balance Overall balance assessment: Modified Independent                                           Pertinent Vitals/Pain Pain Assessment: No/denies pain    Home Living Family/patient expects to be discharged to:: Private residence Living Arrangements: Children Available Help at Discharge: Family Type of Home: House Home Access: Stairs to enter Entrance Stairs-Rails: None Entrance Stairs-Number of Steps: 2 Home Layout: One level Home Equipment: None      Prior Function Level of Independence: Independent         Comments: Pt independent with all ambulation household and community up until 3 weeks ago when he go pneumonia.     Hand Dominance        Extremity/Trunk Assessment   Upper Extremity Assessment: Overall WFL for tasks assessed           Lower Extremity Assessment: Overall WFL for tasks assessed         Communication   Communication: No difficulties  Cognition Arousal/Alertness: Awake/alert Behavior During Therapy: WFL for tasks assessed/performed Overall Cognitive Status: Within Functional Limits for tasks assessed                      General Comments General comments (skin integrity, edema, etc.): cachectic appearance    Exercises Other  Exercises Other Exercises: B LE therex: resisted AP, HS, SLR, ABD/ADD x 10 reps each Other Exercises: Pt/family education on ambulation 3x/day with assist and clearance from RN      Assessment/Plan    PT Assessment Patient needs continued PT services  PT Diagnosis Difficulty walking   PT Problem List Decreased strength;Decreased activity tolerance  PT Treatment Interventions Gait training;Stair training;Therapeutic activities;Therapeutic exercise;Patient/family education   PT Goals (Current goals can be found in the Care Plan section) Acute Rehab PT Goals Patient Stated Goal: To get stronger PT  Goal Formulation: With patient Time For Goal Achievement: 06/03/15 Potential to Achieve Goals: Good    Frequency Min 2X/week   Barriers to discharge        Co-evaluation               End of Session Equipment Utilized During Treatment: Gait belt Activity Tolerance: Patient tolerated treatment well;Patient limited by fatigue Patient left: in chair;with bed alarm set;with family/visitor present Nurse Communication: Mobility status         Time: 3888-2800 PT Time Calculation (min) (ACUTE ONLY): 29 min   Charges:   PT Evaluation $PT Eval Low Complexity: 1 Procedure PT Treatments $Therapeutic Exercise: 8-22 mins   PT G Codes:        Tasnia Spegal A Khiana Camino, PT 05/20/2015, 12:22 PM

## 2015-05-20 NOTE — Care Management (Signed)
Admitted to [facility] with the diagnosis of pneumonia. Granddaughter, Velna Hatchet, lives with him. Daughter is Kathrin Greathouse [phone]). Last seen Dr. Humphrey Rolls about a month ago. Last seen Dr. Grayland Ormond 2-3 weeks ago. No home Health. No skilled facility. No home oxygen. Works at [employer] 4 days/week. Uses no equipment for ambulation. Takes care of all basic and instrumental activities of daily living himself, drives. No falls. Decreased appetite x 2 weeks. Daughter states that her father has always been little. Shelbie Ammons RN MSN CCM Care Management [phone]

## 2015-05-20 NOTE — Progress Notes (Signed)
Electrolyte CONSULT NOTE - Follow Up  Pharmacy Consult for Electrolyte Supplementation Indication: Hypokalemia  No Known Allergies  Patient Measurements: Height: '5\' 5"'$  (165.1 cm) Weight: 84 lb 8 oz (38.329 kg) IBW/kg (Calculated) : 61.5   Vital Signs: Temp: 98.4 F (36.9 C) (05/08 2033) Temp Source: Oral (05/08 2033) BP: 97/40 mmHg (05/08 2033) Pulse Rate: 71 (05/08 2033) Intake/Output from previous day: 05/07 0701 - 05/08 0700 In: -  Out: 250 [Urine:250] Intake/Output from this shift:    Labs:  Recent Labs  05/19/15 1132 05/20/15 0429  WBC 14.3* 13.4*  HGB 12.0* 10.3*  HCT 37.8* 31.5*  PLT 507* 395  CREATININE 0.61 0.65  MG  --  1.7  ALBUMIN 2.1*  --   PROT 7.3  --   AST 55*  --   ALT 48  --   ALKPHOS 119  --   BILITOT 0.6  --    Estimated Creatinine Clearance: 45.9 mL/min (by C-G formula based on Cr of 0.65).   Microbiology: Recent Results (from the past 720 hour(s))  Blood culture (routine x 2)     Status: None (Preliminary result)   Collection Time: 05/19/15  1:05 PM  Result Value Ref Range Status   Specimen Description BLOOD RIGHT FOREARM  Final   Special Requests BOTTLES DRAWN AEROBIC AND ANAEROBIC  1CC  Final   Culture NO GROWTH < 24 HOURS  Final   Report Status PENDING  Incomplete  Blood culture (routine x 2)     Status: None (Preliminary result)   Collection Time: 05/19/15  1:05 PM  Result Value Ref Range Status   Specimen Description BLOOD LEFT ARM  Final   Special Requests BOTTLES DRAWN AEROBIC AND ANAEROBIC  1CC  Final   Culture NO GROWTH < 24 HOURS  Final   Report Status PENDING  Incomplete    Medical History: Past Medical History  Diagnosis Date  . Pneumonia Nov '15  . COPD (chronic obstructive pulmonary disease) ([place])   . Shortness of breath dyspnea   . Lung cancer [facility]) 1990    Patient states RUL Thoracotomy.  . Lung mass     new spiculated LUL lung mass    Medications:  Scheduled:  . antiseptic oral rinse  7 mL Mouth  Rinse BID  . aspirin EC  81 mg Oral Daily  . docusate sodium  100 mg Oral BID  . enoxaparin (LOVENOX) injection  30 mg Subcutaneous Q24H  . feeding supplement (ENSURE ENLIVE)  237 mL Oral TID BM  . megestrol  400 mg Oral Daily  . mometasone-formoterol  2 puff Inhalation BID  . nicotine  14 mg Transdermal Daily  . piperacillin-tazobactam  3.375 g Intravenous Once  . piperacillin-tazobactam (ZOSYN)  IV  3.375 g Intravenous Q8H  . potassium chloride  10 mEq Intravenous Q1 Hr x 4  . tamsulosin  0.4 mg Oral Daily  . tiotropium  18 mcg Inhalation Daily  . vancomycin  500 mg Intravenous Q24H   Infusions:  . sodium chloride 100 mL/hr at 05/20/15 1607    Assessment: Pharmacy consulted to manage electrolytes in a 70 yo male admitted for pneumonia with hypokalemia.   K: 2.9, Mag: 1.7 K at 1325: 3.1  Goal of Therapy:  K: 3.5-5.1 Mag: 1.7-2.4  Plan:  Per orders, KCl infusion time increased to run over 2 hours.  Will recheck K at 1800 today as only two runs have been completed at this time.  MD ordered Magnesium 2 gm IV once.  5/8:  K @ 18:30 = 3.3.   Will order KCl 10 mEq IV X 4 and recheck K on 5/9 with AM labs.   Pharmacy will continue to follow.  Kimmell D Clinical Pharmacist 05/20/2015

## 2015-05-20 NOTE — Progress Notes (Signed)
Dr. Posey Pronto notified BP 84/40 rechecked 85/36 HR 68. IVF infusing 162m/hr. Critical potassium 2.9. Pt asymptomatic, slightly more lethargic this morning. See new orders. Will continue to closely monitor.

## 2015-05-20 NOTE — Progress Notes (Signed)
Initial Nutrition Assessment  DOCUMENTATION CODES:   Severe malnutrition in context of chronic illness, Underweight  INTERVENTION:   -Cater to pt preferences on Regular diet order -Recommend Ensure Enlive po BID, each supplement provides 350 kcal and 20 grams of protein -Will also send Magic Cup BID and morning snack of fruit -May benefit from checking P as well, as K and Mg supplemented   NUTRITION DIAGNOSIS:   Malnutrition related to chronic illness as evidenced by energy intake < or equal to 75% for > or equal to 1 month, severe depletion of muscle mass, severe depletion of body fat, percent weight loss.  GOAL:   Patient will meet greater than or equal to 90% of their needs  MONITOR:   PO intake, Supplement acceptance, Labs, I & O's, Weight trends  REASON FOR ASSESSMENT:   Consult Poor PO  ASSESSMENT:   Pt admitted with history of lung cancer status post right upper lobectomy in 1990, new left upper lobe lung mass highly suspicious for malignancy per Oncology note last month.  Pt recently treated for pna, however unable to swallow pills per MD note.  Past Medical History  Diagnosis Date  . Pneumonia Nov '15  . COPD (chronic obstructive pulmonary disease) ([place])   . Shortness of breath dyspnea   . Lung cancer [facility]) 1990    Patient states RUL Thoracotomy.  . Lung mass     new spiculated LUL lung mass    Diet Order:  Diet regular Room service appropriate?: Yes; Fluid consistency:: Thin   Pt reports eating midmorning, around 1pm and dinner made by his granddaughter (consisting of meat and potatoes, vegetables,or a pasta dish for example). Family reports pt snacks during the day and never finishes his dinner plate. Pt reports liking Ensure.  Pt reports if Ensure is at the front of the fridge where he can see and reach it, he will drink one.  Pt reports appetite has been down for a while.   Pt reports not trouble swallowing or chewing his foods. Pt eating ice cream this  morning on visit; per reports s/p SLP evaluation. Pt reports eating his chicken and potatoes with carrots last night for dinner after being admitted.  Medications: MgS given, Megace, Colace, KCl, NS at 147m/hr  Labs: K 2.9, Mg 1.7, Glucose 103   Gastrointestinal Profile: Last BM:  05/20/2015   Nutrition-Focused Physical Exam Findings: Nutrition-Focused physical exam completed. Findings are severe fat depletion, severe muscle depletion, and no edema.    Weight Change: Pt reports weight of 89lbs 3 weeks ago, RD measured weight on visit at 84.5lbs.  (5% weight loss in 3 weeks)   Skin:  Reviewed, no issues   Height:   Ht Readings from Last 1 Encounters:  05/20/15 '5\' 5"'$  (1.651 m)    Weight:   Wt Readings from Last 1 Encounters:  05/20/15 84 lb 8 oz (38.329 kg)   Wt Readings from Last 10 Encounters:  05/20/15 84 lb 8 oz (38.329 kg)  04/29/15 87 lb 1.3 oz (39.5 kg)  01/24/15 88 lb (39.917 kg)  01/17/15 87 lb 11.9 oz (39.8 kg)  09/18/14 84 lb (38.102 kg)  07/12/14 84 lb 10.5 oz (38.4 kg)  07/02/14 83 lb 6.4 oz (37.83 kg)    Ideal Body Weight:   61.8kg  BMI:  Body mass index is 14.06 kg/(m^2).  Estimated Nutritional Needs:   Kcal:  1700-2020kcals  Protein:  68-80g protein   Fluid:  1.6-1.9L fluid  EDUCATION NEEDS:   No  education needs identified at this time   Dwyane Luo, RD, LDN Pager [phone] Weekend/On-Call Pager [phone]

## 2015-05-20 NOTE — Evaluation (Addendum)
Clinical/Bedside Swallow Evaluation Patient Details  Name: Rick Clark MRN: [id number] Date of Birth: 1945/01/28  Today's Date: 05/20/2015 Time: SLP Start Time (ACUTE ONLY): 0915 SLP Stop Time (ACUTE ONLY): 1015 SLP Time Calculation (min) (ACUTE ONLY): 60 min  Past Medical History:  Past Medical History  Diagnosis Date  . Pneumonia Nov '15  . COPD (chronic obstructive pulmonary disease) ([place])   . Shortness of breath dyspnea   . Lung cancer [facility]) 1990    Patient states RUL Thoracotomy.  . Lung mass     new spiculated LUL lung mass   Past Surgical History:  Past Surgical History  Procedure Laterality Date  . Lung lobectomy Right     Right upper lobe  . Flexible bronchoscopy Bilateral 09/18/2014    Procedure: FLEXIBLE BRONCHOSCOPY;  Surgeon: Allyne Gee, MD;  Location: [facility] ORS;  Service: Pulmonary;  Laterality: Bilateral;   HPI:  Pt is a 70 y.o. male with a known history of COPD not on home O2, pneumonia, and dyspnea, ongoing smoking, prior history of lung cancer status post right upper lobectomy in 1990, new left upper lobe spiculated lung mass being followed by oncology presents to the hospital from home secondary to worsening cough and lower oxygen saturations. Pt stated he has had trouble swallowing pills "all my life". Recently, he has found it too difficult to swallow some of the larger pills and capsules prescribed and had increased choking/coughing episodes prior to this hospitalization. Unsure if current respiratory status could be related to the choking/coughing (overt s/s of aspiration occuring). Pt denies having trouble drinking his coughing and eating the breakfast meal this morning, "regular foods". Noted CT Angio of Chest results. Pt does have a h/o RUL thoracotomy.  Assessment / Plan / Recommendation Clinical Impression  Pt appears to adequately tolerate trials of thin liquids and purees (declined solids sec. to edentulous status and only eating broken down, cooked  foods) w/ no overt s/s of aspiration noted. Pt consumed trials w/ clear vocal quality b/t; no decline in respiratory status. Pt coughed and cleared what appeared to be phlegm x1 during po intake but this did not appear related to the swallowing of po trials; pt indicated so. No oral phase deficits noted w/ consistencies consumed; pt cleared appropriately b/t trials. Pt fed self w/ min. setup support. Pt described chronic issues swallowing pills for many years. He indicated he was pleased the ease of swallowing pills crushed in puree. Suggested he continue this and/or get meds in liquid form also for ease of swallowing. Also educated pt/family on need to take rest breaks during meals/po intake to avoid any WOB/SOB and lessen risk for aspiration. Pt's family present agreed. Pt appears at reduced risk for aspiration w/ current po diet following general aspiration precautions and swallowing meds crushed mixed in puree or in liquid form to lessen discomfort and risk of choking/coughing attempting to swallow large pills. Pt/family agreed. No further skilled ST services indicated at this time. NSG to reconsult if any change in status while admitted.     Aspiration Risk   (reduced following precautions; crushed meds in puree`)    Diet Recommendation  Dysphagia 3(mech soft) w/ thin liquids; general aspiration precautions; rest breaks during meals to avoid WOB/SOB w/ po intake.   Medication Administration: Crushed with puree (or in liquid form) for easier swallowing   Other  Recommendations Recommended Consults:  (Dietician consult and f/u) Oral Care Recommendations: Oral care BID;Staff/trained caregiver to provide oral care   Follow up  Recommendations  None    Frequency and Duration            Prognosis Prognosis for Safe Diet Advancement: Good Barriers to Reach Goals:  (respiratory status)      Swallow Study   General Date of Onset: 05/19/15 HPI: Pt is a 70 y.o. male with a known history of COPD  not on home O2, pneumonia, and dyspnea, ongoing smoking, prior history of lung cancer status post right upper lobectomy in 1990, new left upper lobe spiculated lung mass being followed by oncology presents to the hospital from home secondary to worsening cough and lower oxygen saturations. Pt stated he has had trouble swallowing pills "all my life". Recently, he has found it too difficult to swallow some of the larger pills and capsules prescribed and had increased choking/coughing episodes prior to this hospitalization. Unsure if current respiratory status could be related to the choking/coughing (overt s/s of aspiration occuring). Pt denies having trouble drinking his coughing and eating the breakfast meal this morning, "regular foods".  Type of Study: Bedside Swallow Evaluation Previous Swallow Assessment: none Diet Prior to this Study: Dysphagia 3 (soft);Thin liquids Temperature Spikes Noted: No (wbc 13.4 down from admit; noted CXR results) Respiratory Status: Room air History of Recent Intubation: No Behavior/Cognition: Alert;Cooperative;Pleasant mood Oral Cavity Assessment: Within Functional Limits Oral Care Completed by SLP: Recent completion by staff Oral Cavity - Dentition: Edentulous Vision: Functional for self-feeding Self-Feeding Abilities: Able to feed self Patient Positioning: Upright in bed Baseline Vocal Quality: Normal (coughed phlegm intermittently) Volitional Cough: Strong Volitional Swallow: Able to elicit    Oral/Motor/Sensory Function Overall Oral Motor/Sensory Function: Within functional limits   Ice Chips Ice chips: Not tested   Thin Liquid Thin Liquid: Within functional limits Presentation: Self Fed;Straw (7-8 swallows)    Nectar Thick Nectar Thick Liquid: Not tested   Honey Thick Honey Thick Liquid: Not tested   Puree Puree: Within functional limits Presentation: Self Fed;Spoon (6 trials)   Solid   GO   Solid:  (declined - eats cooked foods d/t edentulous  status)        [name], MS, CCC-SLP  Watson,Katherine 05/20/2015,12:29 PM

## 2015-05-20 NOTE — Progress Notes (Signed)
Rick Clark NAME: Rick Clark    MR#:  [id number]  DATE OF BIRTH:  May 14, 1945  SUBJECTIVE:  CHIEF COMPLAINT:   Chief Complaint  Patient presents with  . Shortness of Breath   Has cough but no shortness of breath. REVIEW OF SYSTEMS:  CONSTITUTIONAL: No fever, fatigue or weakness.  EYES: No blurred or double vision.  EARS, NOSE, AND THROAT: No tinnitus or ear pain.  RESPIRATORY: Has cough, no shortness of breath, wheezing or hemoptysis.  CARDIOVASCULAR: No chest pain, orthopnea, edema.  GASTROINTESTINAL: No nausea, vomiting, diarrhea or abdominal pain.  GENITOURINARY: No dysuria, hematuria.  ENDOCRINE: No polyuria, nocturia,  HEMATOLOGY: No anemia, easy bruising or bleeding SKIN: No rash or lesion. MUSCULOSKELETAL: No joint pain or arthritis.   NEUROLOGIC: No tingling, numbness, weakness.  PSYCHIATRY: No anxiety or depression.   DRUG ALLERGIES:  No Known Allergies  VITALS:  Blood pressure 89/33, pulse 68, temperature 97.7 F (36.5 C), temperature source Oral, resp. rate 16, height '5\' 5"'$  (1.651 m), weight 38.329 kg (84 lb 8 oz), SpO2 92 %.  PHYSICAL EXAMINATION:  GENERAL:  70 y.o.-year-old patient lying in the bed with no acute distress. Very thin and in severe malnutrition status. EYES: Pupils equal, round, reactive to light and accommodation. No scleral icterus. Extraocular muscles intact.  HEENT: Head atraumatic, normocephalic. Oropharynx and nasopharynx clear.  NECK:  Supple, no jugular venous distention. No thyroid enlargement, no tenderness.  LUNGS: Normal breath sounds bilaterally, no wheezing, rales,rhonchi or crepitation. No use of accessory muscles of respiration.  CARDIOVASCULAR: S1, S2 normal. No murmurs, rubs, or gallops.  ABDOMEN: Soft, nontender, nondistended. Bowel sounds present. No organomegaly or mass.  EXTREMITIES: No pedal edema, cyanosis, or clubbing.  NEUROLOGIC: Cranial nerves II through  XII are intact. Muscle strength 5/5 in all extremities. Sensation intact. Gait not checked.  PSYCHIATRIC: The patient is alert and oriented x 3.  SKIN: No obvious rash, lesion, or ulcer.    LABORATORY PANEL:   CBC  Recent Labs Lab 05/20/15 0429  WBC 13.4*  HGB 10.3*  HCT 31.5*  PLT 395   ------------------------------------------------------------------------------------------------------------------  Chemistries   Recent Labs Lab 05/19/15 1132 05/20/15 0429  NA 135 137  K 3.6 2.9*  CL 97* 103  CO2 28 28  GLUCOSE 126* 103*  BUN 16 15  CREATININE 0.61 0.65  CALCIUM 8.3* 7.5*  MG  --  1.7  AST 55*  --   ALT 48  --   ALKPHOS 119  --   BILITOT 0.6  --    ------------------------------------------------------------------------------------------------------------------  Cardiac Enzymes  Recent Labs Lab 05/19/15 1132  TROPONINI <0.03   ------------------------------------------------------------------------------------------------------------------  RADIOLOGY:  Dg Chest 2 View  05/19/2015  CLINICAL DATA:  Shortness of breath with hypoxia. History of lung carcinoma EXAM: CHEST  2 VIEW COMPARISON:  Chest radiograph September 27, 2014; chest CT April 22, 2015 FINDINGS: There is underlying COPD. There is extensive airspace consolidation throughout the posterior and superior segments of the right lower lobe. There is a persistent nodular lesion in the periphery of the left upper lobe measuring 1.1 x 1.0 cm, not appreciably changed from recent CT. Heart size is normal. Pulmonary vascularity is normal. There is postoperative change on the right. No adenopathy is appreciable by radiography. No bone lesions. IMPRESSION: Widespread airspace consolidation persists on the right compared to approximately 1 month prior. The persistence of this consolidation may warrant bronchoscopy to further evaluate. Nodular lesion left upper lobe, concerning  for neoplasm. This nodular lesion is  stable compared to 1 month prior. Underlying COPD. Postoperative change right lung. Stable cardiac silhouette. Electronically Signed   By: Lowella Grip III M.D.   On: 05/19/2015 12:11   Ct Angio Chest Pe W/cm &/or Wo Cm  05/19/2015  CLINICAL DATA:  Shortness of breath, hypoxemia, 70% saturation at home, recent pneumonia, history lung cancer post RIGHT upper lobectomy, COPD, smoker EXAM: CT ANGIOGRAPHY CHEST WITH CONTRAST TECHNIQUE: Multidetector CT imaging of the chest was performed using the standard protocol during bolus administration of intravenous contrast. Multiplanar CT image reconstructions and MIPs were obtained to evaluate the vascular anatomy. CONTRAST:  60 cc Isovue 370 IV COMPARISON:  04/22/2015 FINDINGS: Scattered atherosclerotic calcifications aorta, proximal great vessels, and coronary arteries. Aorta normal caliber without aneurysm or dissection. 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 Pulmonary arteries patent. No evidence of pulmonary embolism. Visualized upper abdomen grossly unremarkable. Enlarged as ago esophageal recess lymph node 12 mm short axis previously 9 mm. Abnormal soft tissue likely lymph nodes at inferior LEFT frontal as well, increased. Opacification of the LEFT lower lobe bronchus bite mucus or fluid with complete LEFT lower lobe atelectasis. Severe underlying emphysematous changes. Nodule with spiculated margins at lateral LEFT upper lobe 14 x 9 mm, previously 12 x 9 mm. Significantly increased opacification in the RIGHT lower lobe which may represent pneumonia. Several areas of fluid attenuation are seen within the consolidated RIGHT lower lobe, question lung necrosis or drowned lung but cannot completely exclude developing lung abscess formation, with largest questioned collection 2.0 cm greatest size image 158. Persistent RIGHT apex scarring. No pleural effusion or pneumothorax. No acute osseous findings. Review of the MIP images confirms  the above findings. IMPRESSION: No evidence of pulmonary embolism. Increased RIGHT lower lobe consolidation consistent with pneumonia. Subtle areas of lower attenuation within the consolidated RIGHT lower lobe are of fluid attenuation, could represent lung necrosis or drowned lung but early abscess formation not excluded ; short-term follow-up CT recommended. Severe COPD changes with minimal increase in size of a LEFT upper lobe nodule. Slight increase in size of an azygoesophageal recess lymph node. Electronically Signed   By: Lavonia Dana M.D.   On: 05/19/2015 14:38    EKG:   Orders placed or performed during the hospital encounter of 05/19/15  . EKG 12-Lead  . EKG 12-Lead    ASSESSMENT AND PLAN:  Rick Clark is a 70 y.o. male with a known history of COPD not on home oxygen, ongoing smoking, prior history of lung cancer status post right upper lobectomy in 1990, new left upper lobe spiculated lung mass being followed by oncology presents to the hospital from home secondary to worsening cough and lower oxygen saturations. He was prescribed outpatient levaquin, but patient has trouble swallowing pills and did not take the antibiotic.  #1 CAP ([facility]) wh leukocytosis. -CT of the chest revealing worsening pneumonia with possible underlying abscess. Continue vancomycin and Zosyn. Follow CBC and blood cultures. Off Oxygen. DuoNeb when necessary.  #2 COPD-stable.  -Nebs when necessary. Continue home inhalers.  #3 spiculated left upper lobe lung mass-following with oncology as outpatient. -No immediate need for biopsy due to risk for pneumothorax as he had in the past. Follow-up oncology. Last CT was on 04/22/2015 and had another CT scan done today.  #4 tobacco use disorder-counseled against smoking for almost 3 minutes. on nicotine patch.  #5 failure to thrive-severe protein calorie malnutrition noted. Continue Megace. Follow-up Dietitian consult  Hypertension.  Given normal saline bolus  and Continue IV fluid support. Hypokalemia. Give potassium supplement and follow-up BMP. Anemia is decreased hemoglobin. Possible due to IV fluid dilution. Follow-up CBC.  Physical therapy suggested no PT follow-up   All the records are reviewed and case discussed with Care Management/Social Workerr. Management plans discussed with the patient, his wife and daughter and they are in agreement.  CODE STATUS: Full code.  TOTAL TIME TAKING CARE OF THIS PATIENT: 39 minutes.  Greater than 50% time was spent on coordination of care and face-to-face counseling.  POSSIBLE D/C IN 2 DAYS, DEPENDING ON CLINICAL CONDITION.   Demetrios Loll M.D on 05/20/2015 at 12:52 PM  Between 7am to 6pm - Pager - [phone]  After 6pm go to [email or web address] - password [email or web address] [facility]  Office  [phone]  CC: Primary care physician; [name], MD

## 2015-05-20 NOTE — Consult Note (Signed)
[place]  [phone] [phone]  ID: Rick Clark OB: 04/01/1945  MR#: [id number]  IWL#:[id number]  Patient Care Team: [name], MD as PCP - General (Internal Medicine)  CHIEF COMPLAINT:  Chief Complaint  Patient presents with  . Shortness of Breath    INTERVAL HISTORY: Patient is a 70 year old male with a known pulmonary nodule whose last evaluated in clinic on April 29, 2015. At that point his imaging suggested an underlying pneumonia and patient was given a 14 day course of Levaquin which he did not take. He was recently admitted to hospital with worsening shortness of breath, cough, and worsening pneumonia. Currently, he feels improved since admission but not back to his baseline. He has no neurologic complaints. He has a fair appetite. He denies any fevers. He has no nausea, vomiting, constipation, or diarrhea. He has no urinary complaints. Patient otherwise feels well and offers no further specific complaints.  REVIEW OF SYSTEMS:   Review of Systems  Constitutional: Positive for malaise/fatigue. Negative for fever and weight loss.  HENT: Negative.   Respiratory: Positive for cough, sputum production and shortness of breath. Negative for hemoptysis.   Cardiovascular: Negative for chest pain and leg swelling.  Gastrointestinal: Negative.  Negative for nausea and vomiting.  Musculoskeletal: Negative.   Neurological: Positive for weakness.  Psychiatric/Behavioral: Negative.     As per HPI. Otherwise, a complete review of systems is negatve.  PAST MEDICAL HISTORY: Past Medical History  Diagnosis Date  . Pneumonia Nov '15  . COPD (chronic obstructive pulmonary disease) ([place])   . Shortness of breath dyspnea   . Lung cancer [facility]) 1990    Patient states RUL Thoracotomy.  . Lung mass     new spiculated LUL lung mass    PAST SURGICAL HISTORY: Past Surgical History  Procedure Laterality Date  . Lung lobectomy Right    Right upper lobe  . Flexible bronchoscopy Bilateral 09/18/2014    Procedure: FLEXIBLE BRONCHOSCOPY;  Surgeon: Allyne Gee, MD;  Location: [facility] ORS;  Service: Pulmonary;  Laterality: Bilateral;    FAMILY HISTORY Family History  Problem Relation Age of Onset  . Coronary artery disease Father   . Diabetes Mother        ADVANCED DIRECTIVES:    HEALTH MAINTENANCE: Social History  Substance Use Topics  . Smoking status: Current Every Day Smoker -- 0.50 packs/day for 55 years    Types: Cigarettes  . Smokeless tobacco: Never Used  . Alcohol Use: No     Colonoscopy:  PAP:  Bone density:  Lipid panel:  No Known Allergies  Current Facility-Administered Medications  Medication Dose Route Frequency Provider Last Rate Last Dose  . 0.9 %  sodium chloride infusion   Intravenous Continuous Lance Coon, MD 100 mL/hr at 05/20/15 2100    . acetaminophen (TYLENOL) tablet 650 mg  650 mg Oral Q6H PRN Gladstone Lighter, MD       Or  . acetaminophen (TYLENOL) suppository 650 mg  650 mg Rectal Q6H PRN Gladstone Lighter, MD      . antiseptic oral rinse (CPC / CETYLPYRIDINIUM CHLORIDE 0.05%) solution 7 mL  7 mL Mouth Rinse BID Demetrios Loll, MD   7 mL at 05/20/15 2101  . aspirin EC tablet 81 mg  81 mg Oral Daily Gladstone Lighter, MD   81 mg at 05/20/15 0909  . docusate sodium (COLACE) capsule 100 mg  100 mg Oral BID Gladstone Lighter, MD   100 mg at 05/19/15 2030  .  enoxaparin (LOVENOX) injection 30 mg  30 mg Subcutaneous Q24H Demetrios Loll, MD   30 mg at 05/20/15 2100  . feeding supplement (ENSURE ENLIVE) (ENSURE ENLIVE) liquid 237 mL  237 mL Oral TID BM Demetrios Loll, MD   237 mL at 05/20/15 1558  . ipratropium-albuterol (DUONEB) 0.5-2.5 (3) MG/3ML nebulizer solution 3 mL  3 mL Nebulization Q6H PRN Gladstone Lighter, MD      . megestrol (MEGACE) 400 MG/10ML suspension 400 mg  400 mg Oral Daily Gladstone Lighter, MD   400 mg at 05/20/15 0912  . mometasone-formoterol (DULERA) 200-5 MCG/ACT inhaler 2 puff  2  puff Inhalation BID Gladstone Lighter, MD   2 puff at 05/20/15 2100  . nicotine (NICODERM CQ - dosed in mg/24 hours) patch 14 mg  14 mg Transdermal Daily Lenis Noon, RPH   14 mg at 05/19/15 1717  . ondansetron (ZOFRAN) tablet 4 mg  4 mg Oral Q6H PRN Gladstone Lighter, MD       Or  . ondansetron (ZOFRAN) injection 4 mg  4 mg Intravenous Q6H PRN Gladstone Lighter, MD      . piperacillin-tazobactam (ZOSYN) IVPB 3.375 g  3.375 g Intravenous Once Lenis Noon, RPH   3.375 g at 05/19/15 2009  . piperacillin-tazobactam (ZOSYN) IVPB 3.375 g  3.375 g Intravenous [address] [place], RPH   3.375 g at 05/20/15 2100  . polyethylene glycol (MIRALAX / GLYCOLAX) packet 17 g  17 g Oral Daily PRN Gladstone Lighter, MD      . potassium chloride 10 mEq in 100 mL IVPB  10 mEq Intravenous Q1 Hr x 4 Demetrios Loll, MD   10 mEq at 05/20/15 2151  . tamsulosin (FLOMAX) capsule 0.4 mg  0.4 mg Oral Daily Gladstone Lighter, MD   0.4 mg at 05/20/15 0909  . tiotropium (SPIRIVA) inhalation capsule 18 mcg  18 mcg Inhalation Daily Gladstone Lighter, MD   18 mcg at 05/20/15 0915  . vancomycin (VANCOCIN) 500 mg in sodium chloride 0.9 % 100 mL IVPB  500 mg Intravenous Q24H Lenis Noon, RPH   500 mg at 05/20/15 0525    OBJECTIVE: Filed Vitals:   05/20/15 1259 05/20/15 2033  BP: 95/35 97/40  Pulse: 72 71  Temp: 97.6 F (36.4 C) 98.4 F (36.9 C)  Resp: 24 24     Body mass index is 14.06 kg/(m^2).    ECOG FS:1 - Symptomatic but completely ambulatory  General: Thin, no acute distress. Eyes: Pink conjunctiva, anicteric sclera. HEENT: Normocephalic, moist mucous membranes, clear oropharnyx. Lungs: Scattered wheezing throughout. Heart: Regular rate and rhythm. No rubs, murmurs, or gallops. Abdomen: Soft, nontender, nondistended. No organomegaly noted, normoactive bowel sounds. Musculoskeletal: No edema, cyanosis, or clubbing. Neuro: Alert, answering all questions appropriately. Cranial nerves grossly intact. Skin: No rashes or  petechiae noted. Psych: Normal affect.   LAB RESULTS:  Lab Results  Component Value Date   NA 137 05/20/2015   K 3.3* 05/20/2015   CL 103 05/20/2015   CO2 28 05/20/2015   GLUCOSE 103* 05/20/2015   BUN 15 05/20/2015   CREATININE 0.65 05/20/2015   CALCIUM 7.5* 05/20/2015   PROT 7.3 05/19/2015   ALBUMIN 2.1* 05/19/2015   AST 55* 05/19/2015   ALT 48 05/19/2015   ALKPHOS 119 05/19/2015   BILITOT 0.6 05/19/2015   GFRNONAA >60 05/20/2015   GFRAA >60 05/20/2015    Lab Results  Component Value Date   WBC 13.4* 05/20/2015   NEUTROABS 11.2* 05/19/2015   HGB 10.3*  05/20/2015   HCT 31.5* 05/20/2015   MCV 77.7* 05/20/2015   PLT 395 05/20/2015     STUDIES: Dg Chest 2 View  05/19/2015  CLINICAL DATA:  Shortness of breath with hypoxia. History of lung carcinoma EXAM: CHEST  2 VIEW COMPARISON:  Chest radiograph September 27, 2014; chest CT April 22, 2015 FINDINGS: There is underlying COPD. There is extensive airspace consolidation throughout the posterior and superior segments of the right lower lobe. There is a persistent nodular lesion in the periphery of the left upper lobe measuring 1.1 x 1.0 cm, not appreciably changed from recent CT. Heart size is normal. Pulmonary vascularity is normal. There is postoperative change on the right. No adenopathy is appreciable by radiography. No bone lesions. IMPRESSION: Widespread airspace consolidation persists on the right compared to approximately 1 month prior. The persistence of this consolidation may warrant bronchoscopy to further evaluate. Nodular lesion left upper lobe, concerning for neoplasm. This nodular lesion is stable compared to 1 month prior. Underlying COPD. Postoperative change right lung. Stable cardiac silhouette. Electronically Signed   By: Lowella Grip III M.D.   On: 05/19/2015 12:11   Ct Chest W Contrast  04/22/2015  CLINICAL DATA:  Followup pulmonary nodule EXAM: CT CHEST WITH CONTRAST TECHNIQUE: Multidetector CT imaging of  the chest was performed during intravenous contrast administration. CONTRAST:  20m ISOVUE-300 IOPAMIDOL (ISOVUE-300) INJECTION 61% COMPARISON:  12/04/2014 and 11/21/2013 FINDINGS: Mediastinum/Lymph Nodes: Heart size is normal. Aortic atherosclerosis noted. The trachea appears patent and is midline. Aortic atherosclerosis is noted. Sub- carinal lymph node measures 0.9 cm, image 82 series 2. Previously 0.7 cm. Lungs/Pleura: Chronic pleural thickening and calcification overlying the posterior right lower lobe is identified. There is dense airspace consolidation identified within the posterior lower lobes right greater than left. In the areas of consolidation within the periphery of the right lower lobe there are central areas of low attenuation which may represent necrosis. Additionally, there are is ground-glass attenuation and interstitial thickening. There are multiple areas of bronchial impaction identified bilaterally which is concerning for aspiration. In the left upper lobe there is a 9 x 1.3 mm nodule (11 mm mean diameter) on image 35 of series 3. On the study from 12/04/2014 this had a mean diameter of 10 mm. Advanced changes of centrilobular and paraseptal emphysema. Upper abdomen: These are too small to characterize. There is a nodule within the left adrenal gland which appears unchanged from previous exam measuring 9 mm, image 163 of series 2. Musculoskeletal: No aggressive lytic or sclerotic bone lesions. IMPRESSION: 1. New bilateral areas of dense airspace consolidation and bronchial impaction within both lower lobes. In the acute setting findings may to represent aspiration and/or pneumonia. Followup imaging is recommended in 3-4 weeks following trial of antibiotic therapy to ensure resolution and exclude underlying malignancy. 2. Stable to minimal increase in size of left upper lobe spiculated nodule previously demonstrating borderline malignant range FDG uptake. Cannot rule out slow-growing indolent  neoplasm in this patient who has extensive smoking related changes throughout both lungs and a prior history of lung cancer. 3. Mild increase in size of sub- carinal lymph node which may be reactive in the setting of pneumonia. 4. Aortic atherosclerosis. Electronically Signed   By: TKerby MoorsM.D.   On: 04/22/2015 08:13   Ct Angio Chest Pe W/cm &/or Wo Cm  05/19/2015  CLINICAL DATA:  Shortness of breath, hypoxemia, 70% saturation at home, recent pneumonia, history lung cancer post RIGHT upper lobectomy, COPD, smoker EXAM: CT ANGIOGRAPHY  CHEST WITH CONTRAST TECHNIQUE: Multidetector CT imaging of the chest was performed using the standard protocol during bolus administration of intravenous contrast. Multiplanar CT image reconstructions and MIPs were obtained to evaluate the vascular anatomy. CONTRAST:  60 cc Isovue 370 IV COMPARISON:  04/22/2015 FINDINGS: Scattered atherosclerotic calcifications aorta, proximal great vessels, and coronary arteries. Aorta normal caliber without aneurysm or dissection. 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 Pulmonary arteries patent. No evidence of pulmonary embolism. Visualized upper abdomen grossly unremarkable. Enlarged as ago esophageal recess lymph node 12 mm short axis previously 9 mm. Abnormal soft tissue likely lymph nodes at inferior LEFT frontal as well, increased. Opacification of the LEFT lower lobe bronchus bite mucus or fluid with complete LEFT lower lobe atelectasis. Severe underlying emphysematous changes. Nodule with spiculated margins at lateral LEFT upper lobe 14 x 9 mm, previously 12 x 9 mm. Significantly increased opacification in the RIGHT lower lobe which may represent pneumonia. Several areas of fluid attenuation are seen within the consolidated RIGHT lower lobe, question lung necrosis or drowned lung but cannot completely exclude developing lung abscess formation, with largest questioned collection 2.0 cm greatest size image  158. Persistent RIGHT apex scarring. No pleural effusion or pneumothorax. No acute osseous findings. Review of the MIP images confirms the above findings. IMPRESSION: No evidence of pulmonary embolism. Increased RIGHT lower lobe consolidation consistent with pneumonia. Subtle areas of lower attenuation within the consolidated RIGHT lower lobe are of fluid attenuation, could represent lung necrosis or drowned lung but early abscess formation not excluded ; short-term follow-up CT recommended. Severe COPD changes with minimal increase in size of a LEFT upper lobe nodule. Slight increase in size of an azygoesophageal recess lymph node. Electronically Signed   By: Lavonia Dana M.D.   On: 05/19/2015 14:38    ASSESSMENT: Bilateral, PET positive pulmonary nodules suspicious for underlying malignancy. Now with underlying pneumonia.  PLAN:    1. Pneumonia: Patient was given a course of PO Levaquin several weeks ago which he did not take. Agree with current antibiotics. 2. Pulmonary nodules: CT scan results reviewed independently with minimal increase in size of left upper lobe spiculated nodule which is highly suspicious for underlying malignancy. Will continue to monitor patient with periodic CT scans. Given patient's poor lung function, he is at high risk for pneumothorax if biopsy were attempted. Return to clinic as previously scheduled in August for repeat imaging and further evaluation.  3. Leukocytosis: Likely reactive, monitor. 4. Anemia: Mild, monitor.  Appreciate consult, call with questions.  Lloyd Huger, MD   05/20/2015 11:40 PM

## 2015-05-20 NOTE — Progress Notes (Signed)
Electrolyte CONSULT NOTE - Follow Up  Pharmacy Consult for Electrolyte Supplementation Indication: Hypokalemia  No Known Allergies  Patient Measurements: Height: '5\' 5"'$  (165.1 cm) Weight: 84 lb 8 oz (38.329 kg) IBW/kg (Calculated) : 61.5   Vital Signs: Temp: 97.6 F (36.4 C) (05/08 1259) Temp Source: Oral (05/08 1259) BP: 95/35 mmHg (05/08 1259) Pulse Rate: 72 (05/08 1259) Intake/Output from previous day: 05/07 0701 - 05/08 0700 In: -  Out: 250 [Urine:250] Intake/Output from this shift:    Labs:  Recent Labs  05/19/15 1132 05/20/15 0429  WBC 14.3* 13.4*  HGB 12.0* 10.3*  HCT 37.8* 31.5*  PLT 507* 395  CREATININE 0.61 0.65  MG  --  1.7  ALBUMIN 2.1*  --   PROT 7.3  --   AST 55*  --   ALT 48  --   ALKPHOS 119  --   BILITOT 0.6  --    Estimated Creatinine Clearance: 45.9 mL/min (by C-G formula based on Cr of 0.65).   Microbiology: Recent Results (from the past 720 hour(s))  Blood culture (routine x 2)     Status: None (Preliminary result)   Collection Time: 05/19/15  1:05 PM  Result Value Ref Range Status   Specimen Description BLOOD RIGHT FOREARM  Final   Special Requests BOTTLES DRAWN AEROBIC AND ANAEROBIC  1CC  Final   Culture NO GROWTH < 24 HOURS  Final   Report Status PENDING  Incomplete  Blood culture (routine x 2)     Status: None (Preliminary result)   Collection Time: 05/19/15  1:05 PM  Result Value Ref Range Status   Specimen Description BLOOD LEFT ARM  Final   Special Requests BOTTLES DRAWN AEROBIC AND ANAEROBIC  1CC  Final   Culture NO GROWTH < 24 HOURS  Final   Report Status PENDING  Incomplete    Medical History: Past Medical History  Diagnosis Date  . Pneumonia Nov '15  . COPD (chronic obstructive pulmonary disease) ([place])   . Shortness of breath dyspnea   . Lung cancer [facility]) 1990    Patient states RUL Thoracotomy.  . Lung mass     new spiculated LUL lung mass    Medications:  Scheduled:  . antiseptic oral rinse  7 mL Mouth  Rinse BID  . aspirin EC  81 mg Oral Daily  . docusate sodium  100 mg Oral BID  . enoxaparin (LOVENOX) injection  40 mg Subcutaneous Q24H  . feeding supplement (ENSURE ENLIVE)  237 mL Oral TID BM  . megestrol  400 mg Oral Daily  . mometasone-formoterol  2 puff Inhalation BID  . nicotine  14 mg Transdermal Daily  . piperacillin-tazobactam  3.375 g Intravenous Once  . piperacillin-tazobactam (ZOSYN)  IV  3.375 g Intravenous Q8H  . potassium chloride  10 mEq Intravenous Q2H  . tamsulosin  0.4 mg Oral Daily  . tiotropium  18 mcg Inhalation Daily  . vancomycin  500 mg Intravenous Q24H   Infusions:  . sodium chloride 100 mL/hr at 05/20/15 0705    Assessment: Pharmacy consulted to manage electrolytes in a 70 yo male admitted for pneumonia with hypokalemia.   K: 2.9, Mag: 1.7 K at 1325: 3.1  Goal of Therapy:  K: 3.5-5.1 Mag: 1.7-2.4  Plan:  Per orders, KCl infusion time increased to run over 2 hours.  Will recheck K at 1800 today as only two runs have been completed at this time.  MD ordered Magnesium 2 gm IV once.    Pharmacy  will continue to follow.  Murrell Converse, PharmD Clinical Pharmacist 05/20/2015

## 2015-05-20 NOTE — Progress Notes (Signed)
   05/20/15 1000  Clinical Encounter Type  Visited With Patient and family together  Visit Type Initial  Referral From Nurse  Consult/Referral To Chaplain  Spiritual Encounters  Spiritual Needs Literature  Stress Factors  Patient Stress Factors Exhausted;Family relationships;Health changes  Advance Directives (For Healthcare)  Does patient have an advance directive? No  Would patient like information on creating an advanced directive? Yes - Educational materials given  Provided patient & family AD documents and explained purpose & process. Advised patient to notify nursing staff to contact chaplain when the patient is ready to proceed with the documents. Chap. Jeromiah Ohalloran G. Smock

## 2015-05-20 NOTE — Progress Notes (Signed)
Electrolyte CONSULT NOTE - INITIAL   Pharmacy Consult for Electrolyte Supplementation Indication: Hypokalemia  No Known Allergies  Patient Measurements: Height: '5\' 5"'$  (165.1 cm) Weight: 89 lb (40.37 kg) IBW/kg (Calculated) : 61.5   Vital Signs: Temp: 98.4 F (36.9 C) (05/08 0530) Temp Source: Oral (05/08 0530) BP: 88/38 mmHg (05/08 0602) Pulse Rate: 68 (05/08 0602) Intake/Output from previous day: 05/07 0701 - 05/08 0700 In: -  Out: 250 [Urine:250] Intake/Output from this shift:    Labs:  Recent Labs  05/19/15 1132 05/20/15 0429  WBC 14.3* 13.4*  HGB 12.0* 10.3*  HCT 37.8* 31.5*  PLT 507* 395  CREATININE 0.61 0.65  MG  --  1.7  ALBUMIN 2.1*  --   PROT 7.3  --   AST 55*  --   ALT 48  --   ALKPHOS 119  --   BILITOT 0.6  --    Estimated Creatinine Clearance: 48.4 mL/min (by C-G formula based on Cr of 0.65).   Microbiology: No results found for this or any previous visit (from the past 720 hour(s)).  Medical History: Past Medical History  Diagnosis Date  . Pneumonia Nov '15  . COPD (chronic obstructive pulmonary disease) ([place])   . Shortness of breath dyspnea   . Lung cancer [facility]) 1990    Patient states RUL Thoracotomy.  . Lung mass     new spiculated LUL lung mass    Medications:  Scheduled:  . aspirin EC  81 mg Oral Daily  . docusate sodium  100 mg Oral BID  . enoxaparin (LOVENOX) injection  40 mg Subcutaneous Q24H  . megestrol  400 mg Oral Daily  . mometasone-formoterol  2 puff Inhalation BID  . nicotine  14 mg Transdermal Daily  . piperacillin-tazobactam  3.375 g Intravenous Once  . piperacillin-tazobactam (ZOSYN)  IV  3.375 g Intravenous Q8H  . potassium chloride  10 mEq Intravenous Q1 Hr x 4  . tamsulosin  0.4 mg Oral Daily  . tiotropium  18 mcg Inhalation Daily  . vancomycin  500 mg Intravenous Q24H   Infusions:  . sodium chloride 100 mL/hr at 05/19/15 2129    Assessment: Pharmacy consulted to manage electrolytes in a 70 yo male  admitted for pneumonia with hypokalemia.   K: 2.9, Mag: pending  Goal of Therapy:  K: 3.5-5.1 Mag: 1.7-2.4  Plan:  Will order KCl 10 meq IV x 4 and recheck potassium level 1 hour after last dose at 1300.  Magnesium level pending.   Pharmacy will continue to follow.  Murrell Converse, PharmD Clinical Pharmacist 05/20/2015

## 2015-05-20 NOTE — Progress Notes (Signed)
Potassium replacement consult  5/8 AM K+ 2.9. 20 mEq KCl PO x 3 doses ordered. Recheck BMP with tomorrow AM labs.

## 2015-05-21 DIAGNOSIS — J449 Chronic obstructive pulmonary disease, unspecified: Secondary | ICD-10-CM | POA: Diagnosis not present

## 2015-05-21 DIAGNOSIS — R627 Adult failure to thrive: Secondary | ICD-10-CM | POA: Diagnosis not present

## 2015-05-21 DIAGNOSIS — J189 Pneumonia, unspecified organism: Secondary | ICD-10-CM | POA: Diagnosis not present

## 2015-05-21 DIAGNOSIS — C3490 Malignant neoplasm of unspecified part of unspecified bronchus or lung: Secondary | ICD-10-CM | POA: Diagnosis not present

## 2015-05-21 LAB — CBC
HCT: 30.7 % — ABNORMAL LOW (ref 40.0–52.0)
Hemoglobin: 10 g/dL — ABNORMAL LOW (ref 13.0–18.0)
MCH: 24.9 pg — AB (ref 26.0–34.0)
MCHC: 32.5 g/dL (ref 32.0–36.0)
MCV: 76.8 fL — AB (ref 80.0–100.0)
PLATELETS: 398 10*3/uL (ref 150–440)
RBC: 3.99 MIL/uL — ABNORMAL LOW (ref 4.40–5.90)
RDW: 14.3 % (ref 11.5–14.5)
WBC: 13.3 10*3/uL — ABNORMAL HIGH (ref 3.8–10.6)

## 2015-05-21 LAB — BASIC METABOLIC PANEL
Anion gap: 5 (ref 5–15)
BUN: 17 mg/dL (ref 6–20)
CALCIUM: 7.5 mg/dL — AB (ref 8.9–10.3)
CHLORIDE: 106 mmol/L (ref 101–111)
CO2: 26 mmol/L (ref 22–32)
CREATININE: 0.46 mg/dL — AB (ref 0.61–1.24)
GFR calc non Af Amer: >60 mL/min (ref >60)
GLUCOSE: 99 mg/dL (ref 65–99)
Potassium: 3.8 mmol/L (ref 3.5–5.1)
Sodium: 137 mmol/L (ref 135–145)

## 2015-05-21 LAB — MAGNESIUM: MAGNESIUM: 1.8 mg/dL (ref 1.7–2.4)

## 2015-05-21 MED ORDER — VANCOMYCIN HCL 500 MG IV SOLR
500.0000 mg | INTRAVENOUS | Status: DC
  Filled 2015-05-21: qty 500

## 2015-05-21 MED ORDER — VANCOMYCIN HCL IN DEXTROSE 750-5 MG/150ML-% IV SOLN: 750.0000 mg | INTRAVENOUS | Status: DC

## 2015-05-21 MED ORDER — GUAIFENESIN 100 MG/5ML PO SOLN
5.0000 mL | ORAL | Status: DC | PRN
  Administered 2015-05-21 – 2015-05-22 (×4): 100 mg via ORAL
  Filled 2015-05-21 (×4): qty 10

## 2015-05-21 MED ORDER — SODIUM CHLORIDE 0.9 % IV SOLN: 500.0000 mg | INTRAVENOUS | Status: DC

## 2015-05-21 NOTE — Progress Notes (Signed)
Speech Therapy Note: reviewed chart notes, consulted NSG re: pt's status today. Met w/ pt and family. No reported/stated swallowing deficits noted during meals per pt/family and staff. Pt stated he was now swallowing the pills "much better crushed up in applesauce". He continues to present w/ a phlegmy cough noted at rest when talking.  As pt and family are denying any swallowing difficulties at meals, and denying need for ST f/u at this time, ST services will sign off w/ NSG to reconsult if any change in status while admitted. Encouraged pt to continue w/ crushed medications and general aspiration precautions. Pt/family agreed.  

## 2015-05-21 NOTE — Progress Notes (Signed)
Physical Therapy Treatment Patient Details Name: Rick Clark MRN: [id number] DOB: September 21, 1945 Today's Date: 05/21/2015    History of Present Illness Rick Clark is a 70 y.o. male with a known history of COPD not on home oxygen, ongoing smoking, prior history of lung cancer status post right upper lobectomy in 1990, new left upper lobe spiculated lung mass being followed by oncology presents to the hospital from home secondary to worsening cough and lower oxygen saturations.    PT Comments    Pt presents today to PT with decreased ambulation tolerance with O2 Sats down to 87-88% after ambulating 200'.  Pt also demonstrating increased use of accessory muscle during exertion and decreased overall energy this date.  Continue with acute PT services POC.   Follow Up Recommendations  No PT follow up     Equipment Recommendations  None recommended by PT    Recommendations for Other Services       Precautions / Restrictions Precautions Precautions: Fall Precaution Comments: MOD Restrictions Weight Bearing Restrictions: No    Mobility  Bed Mobility Overal bed mobility: Modified Independent Bed Mobility: Supine to Sit     Supine to sit: Supervision     General bed mobility comments: Supine to Parnika Tweten sit, then rotates legs/hips to EOB with supervision  Transfers Overall transfer level: Modified independent Equipment used: None             General transfer comment: Sit<>stand with Mod I  Ambulation/Gait Ambulation/Gait assistance: Supervision Ambulation Distance (Feet): 200 Feet Assistive device: None     Gait velocity interpretation: Below normal speed for age/gender General Gait Details: Steady step through gait pattern with decreaed cadence and increased WOB with accessory muscles at end of session; O2 Sat at 87-88%, recovered to 95% with seated rest   Stairs            Wheelchair Mobility    Modified Rankin (Stroke Patients Only)        Balance Overall balance assessment: Modified Independent                                  Cognition Arousal/Alertness: Awake/alert Behavior During Therapy: WFL for tasks assessed/performed Overall Cognitive Status: Within Functional Limits for tasks assessed                      Exercises Other Exercises Other Exercises: ambulation 200'    General Comments        Pertinent Vitals/Pain Pain Assessment: No/denies pain    Home Living                      Prior Function            PT Goals (current goals can now be found in the care plan section) Acute Rehab PT Goals Patient Stated Goal: To get stronger PT Goal Formulation: With patient Time For Goal Achievement: 06/03/15 Potential to Achieve Goals: Good Progress towards PT goals: Progressing toward goals    Frequency  Min 2X/week    PT Plan Current plan remains appropriate    Co-evaluation             End of Session Equipment Utilized During Treatment: Gait belt Activity Tolerance: Patient tolerated treatment well;Patient limited by fatigue Patient left: in chair;with call bell/phone within reach;with family/visitor present     Time: 8921-1941 PT Time Calculation (min) (ACUTE ONLY): 18 min  Charges:  $Therapeutic Exercise: 8-22 mins                    G Codes:      Tineshia Becraft A Kaine Mcquillen 06-11-15, 12:24 PM

## 2015-05-21 NOTE — Care Management Important Message (Signed)
Important Message  Patient Details  Name: Rick Clark MRN: [id number] Date of Birth: [birth year]   Medicare Important Message Given:  Yes    Juliann Pulse A Correy Weidner 05/21/2015, 1:52 PM

## 2015-05-21 NOTE — Progress Notes (Signed)
[facility] [place] NAME: Rick Clark    MR#:  [id number]  DATE OF BIRTH:  08/30/1945  SUBJECTIVE:  CHIEF COMPLAINT:   Chief Complaint  Patient presents with  . Shortness of Breath   Has cough. REVIEW OF SYSTEMS:  CONSTITUTIONAL: No fever, fatigue or weakness.  EYES: No blurred or double vision.  EARS, NOSE, AND THROAT: No tinnitus or ear pain.  RESPIRATORY: Has cough, no shortness of breath, wheezing or hemoptysis.  CARDIOVASCULAR: No chest pain, orthopnea, edema.  GASTROINTESTINAL: No nausea, vomiting, diarrhea or abdominal pain.  GENITOURINARY: No dysuria, hematuria.  ENDOCRINE: No polyuria, nocturia,  HEMATOLOGY: No anemia, easy bruising or bleeding SKIN: No rash or lesion. MUSCULOSKELETAL: No joint pain or arthritis.   NEUROLOGIC: No tingling, numbness, weakness.  PSYCHIATRY: No anxiety or depression.   DRUG ALLERGIES:  No Known Allergies  VITALS:  Blood pressure 95/44, pulse 68, temperature 98.7 F (37.1 C), temperature source Oral, resp. rate 24, height '5\' 5"'$  (1.651 m), weight 38.329 kg (84 lb 8 oz), SpO2 90 %.  PHYSICAL EXAMINATION:  GENERAL:  70 y.o.-year-old patient lying in the bed with no acute distress. Very thin and in severe malnutrition status. EYES: Pupils equal, round, reactive to light and accommodation. No scleral icterus. Extraocular muscles intact.  HEENT: Head atraumatic, normocephalic. Oropharynx and nasopharynx clear.  NECK:  Supple, no jugular venous distention. No thyroid enlargement, no tenderness.  LUNGS: Normal breath sounds bilaterally, no wheezing, rales,rhonchi or crepitation. No use of accessory muscles of respiration.  CARDIOVASCULAR: S1, S2 normal. No murmurs, rubs, or gallops.  ABDOMEN: Soft, nontender, nondistended. Bowel sounds present. No organomegaly or mass.  EXTREMITIES: No pedal edema, cyanosis, or clubbing.  NEUROLOGIC: Cranial nerves II through XII are intact. Muscle  strength 5/5 in all extremities. Sensation intact. Gait not checked.  PSYCHIATRIC: The patient is alert and oriented x 3.  SKIN: No obvious rash, lesion, or ulcer.    LABORATORY PANEL:   CBC  Recent Labs Lab 05/21/15 0448  WBC 13.3*  HGB 10.0*  HCT 30.7*  PLT 398   ------------------------------------------------------------------------------------------------------------------  Chemistries   Recent Labs Lab 05/19/15 1132  05/21/15 0448  NA 135  < > 137  K 3.6  < > 3.8  CL 97*  < > 106  CO2 28  < > 26  GLUCOSE 126*  < > 99  BUN 16  < > 17  CREATININE 0.61  < > 0.46*  CALCIUM 8.3*  < > 7.5*  MG  --   < > 1.8  AST 55*  --   --   ALT 48  --   --   ALKPHOS 119  --   --   BILITOT 0.6  --   --   < > = values in this interval not displayed. ------------------------------------------------------------------------------------------------------------------  Cardiac Enzymes  Recent Labs Lab 05/19/15 1132  TROPONINI <0.03   ------------------------------------------------------------------------------------------------------------------  RADIOLOGY:  Ct Angio Chest Pe W/cm &/or Wo Cm  05/19/2015  CLINICAL DATA:  Shortness of breath, hypoxemia, 70% saturation at home, recent pneumonia, history lung cancer post RIGHT upper lobectomy, COPD, smoker EXAM: CT ANGIOGRAPHY CHEST WITH CONTRAST TECHNIQUE: Multidetector CT imaging of the chest was performed using the standard protocol during bolus administration of intravenous contrast. Multiplanar CT image reconstructions and MIPs were obtained to evaluate the vascular anatomy. CONTRAST:  60 cc Isovue 370 IV COMPARISON:  04/22/2015 FINDINGS: Scattered atherosclerotic calcifications aorta, proximal great vessels, and coronary arteries. Aorta normal  caliber without aneurysm or dissection. 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 Pulmonary arteries patent. No evidence of pulmonary embolism. Visualized upper  abdomen grossly unremarkable. Enlarged as ago esophageal recess lymph node 12 mm short axis previously 9 mm. Abnormal soft tissue likely lymph nodes at inferior LEFT frontal as well, increased. Opacification of the LEFT lower lobe bronchus bite mucus or fluid with complete LEFT lower lobe atelectasis. Severe underlying emphysematous changes. Nodule with spiculated margins at lateral LEFT upper lobe 14 x 9 mm, previously 12 x 9 mm. Significantly increased opacification in the RIGHT lower lobe which may represent pneumonia. Several areas of fluid attenuation are seen within the consolidated RIGHT lower lobe, question lung necrosis or drowned lung but cannot completely exclude developing lung abscess formation, with largest questioned collection 2.0 cm greatest size image 158. Persistent RIGHT apex scarring. No pleural effusion or pneumothorax. No acute osseous findings. Review of the MIP images confirms the above findings. IMPRESSION: No evidence of pulmonary embolism. Increased RIGHT lower lobe consolidation consistent with pneumonia. Subtle areas of lower attenuation within the consolidated RIGHT lower lobe are of fluid attenuation, could represent lung necrosis or drowned lung but early abscess formation not excluded ; short-term follow-up CT recommended. Severe COPD changes with minimal increase in size of a LEFT upper lobe nodule. Slight increase in size of an azygoesophageal recess lymph node. Electronically Signed   By: Lavonia Dana M.D.   On: 05/19/2015 14:38    EKG:   Orders placed or performed during the hospital encounter of 05/19/15  . EKG 12-Lead  . EKG 12-Lead    ASSESSMENT AND PLAN:  Rick Clark is a 70 y.o. male with a known history of COPD not on home oxygen, ongoing smoking, prior history of lung cancer status post right upper lobectomy in 1990, new left upper lobe spiculated lung mass being followed by oncology presents to the hospital from home secondary to worsening cough and lower  oxygen saturations. He was prescribed outpatient levaquin, but patient has trouble swallowing pills and did not take the antibiotic.  #1 CAP (RLL) wh leukocytosis. -CT of the chest revealing worsening pneumonia with possible underlying abscess. disontinue vancomycin and continue Zosyn. Follow CBC and blood cultures. Off Oxygen. DuoNeb when necessary. robitussin prn.  #2 COPD-stable.  -Nebs when necessary. Continue home inhalers.  #3 spiculated left upper lobe lung mass-following with oncology as outpatient. -No immediate need for biopsy due to risk for pneumothorax as he had in the past. Follow-up oncology as outpatient. Last CT was on 04/22/2015 and had another CT scan done today.  #4 tobacco use disorder-counseled against smoking for almost 3 minutes. on nicotine patch.  #5 failure to thrive-severe protein calorie malnutrition noted. Continue Megace. Follow-up Dietitian consult  Hypotension. Given normal saline bolus and IV fluid support. Better. Looks like BP is low at baseline. Hypokalemia. Given potassium supplement and improved. Anemia is decreased hemoglobin. Possible due to IV fluid dilution. Hb is stable. Physical therapy suggested no PT follow-up   All the records are reviewed and case discussed with Care Management/Social Workerr. Management plans discussed with the patient, his wife and daughter and they are in agreement.  CODE STATUS: Full code.  TOTAL TIME TAKING CARE OF THIS PATIENT: 35 minutes.  Greater than 50% time was spent on coordination of care and face-to-face counseling.  POSSIBLE D/C IN 2 DAYS, DEPENDING ON CLINICAL CONDITION.   Demetrios Loll M.D on 05/21/2015 at 1:27 PM  Between 7am to 6pm - Pager -  [phone]  After 6pm go to [email or web address] - password [email or web address] [facility]  Office  [phone]  CC: Primary care physician; [name], MD

## 2015-05-21 NOTE — Progress Notes (Signed)
   05/21/15 0930  Clinical Encounter Type  Visited With Patient and family together  Visit Type Follow-up  Referral From Nurse  Consult/Referral To Chaplain  Spiritual Encounters  Spiritual Needs Literature;Prayer  Stress Factors  Patient Stress Factors Exhausted;Health changes  Advance Directives (For Healthcare)  Does patient have an advance directive? Yes  Would patient like information on creating an advanced directive? Yes - Scientist, clinical (histocompatibility and immunogenetics) given  Type of Academic librarian  Does patient want to make changes to advanced directive? Yes - information given  Copy of advanced directive(s) in chart? Yes  Completed HPOA for patient. Original provided to patient & copy placed in chart. Chap. Yordi Krager G. [place]

## 2015-05-21 NOTE — Progress Notes (Addendum)
Electrolyte CONSULT NOTE - Follow Up  Pharmacy Consult for Electrolyte Supplementation Indication: Hypokalemia  No Known Allergies  Patient Measurements: Height: '5\' 5"'$  (165.1 cm) Weight: 84 lb 8 oz (38.329 kg) IBW/kg (Calculated) : 61.5   Vital Signs: Temp: 98.7 F (37.1 C) (05/09 0448) Temp Source: Oral (05/09 0448) BP: 95/44 mmHg (05/09 0448) Pulse Rate: 68 (05/09 0448) Intake/Output from previous day: 05/08 0701 - 05/09 0700 In: 1465 [P.O.:480; I.V.:985] Out: 150 [Urine:150] Intake/Output from this shift: Total I/O In: -  Out: 150 [Urine:150]  Labs:  Recent Labs  05/19/15 1132 05/20/15 0429 05/21/15 0448  WBC 14.3* 13.4* 13.3*  HGB 12.0* 10.3* 10.0*  HCT 37.8* 31.5* 30.7*  PLT 507* 395 398  CREATININE 0.61 0.65 0.46*  MG  --  1.7  --   ALBUMIN 2.1*  --   --   PROT 7.3  --   --   AST 55*  --   --   ALT 48  --   --   ALKPHOS 119  --   --   BILITOT 0.6  --   --    Estimated Creatinine Clearance: 45.9 mL/min (by C-G formula based on Cr of 0.46).   Microbiology: Recent Results (from the past 720 hour(s))  Blood culture (routine x 2)     Status: None (Preliminary result)   Collection Time: 05/19/15  1:05 PM  Result Value Ref Range Status   Specimen Description BLOOD RIGHT FOREARM  Final   Special Requests BOTTLES DRAWN AEROBIC AND ANAEROBIC  1CC  Final   Culture NO GROWTH < 24 HOURS  Final   Report Status PENDING  Incomplete  Blood culture (routine x 2)     Status: None (Preliminary result)   Collection Time: 05/19/15  1:05 PM  Result Value Ref Range Status   Specimen Description BLOOD LEFT ARM  Final   Special Requests BOTTLES DRAWN AEROBIC AND ANAEROBIC  1CC  Final   Culture NO GROWTH < 24 HOURS  Final   Report Status PENDING  Incomplete    Medical History: Past Medical History  Diagnosis Date  . Pneumonia Nov '15  . COPD (chronic obstructive pulmonary disease) ([place])   . Shortness of breath dyspnea   . Lung cancer [facility]) 1990    Patient states  RUL Thoracotomy.  . Lung mass     new spiculated LUL lung mass    Medications:  Scheduled:  . antiseptic oral rinse  7 mL Mouth Rinse BID  . aspirin EC  81 mg Oral Daily  . docusate sodium  100 mg Oral BID  . enoxaparin (LOVENOX) injection  30 mg Subcutaneous Q24H  . feeding supplement (ENSURE ENLIVE)  237 mL Oral TID BM  . megestrol  400 mg Oral Daily  . mometasone-formoterol  2 puff Inhalation BID  . nicotine  14 mg Transdermal Daily  . piperacillin-tazobactam  3.375 g Intravenous Once  . piperacillin-tazobactam (ZOSYN)  IV  3.375 g Intravenous Q8H  . tamsulosin  0.4 mg Oral Daily  . tiotropium  18 mcg Inhalation Daily  . [START ON 05/22/2015] vancomycin  750 mg Intravenous Q24H   Infusions:  . sodium chloride 100 mL/hr at 05/20/15 2100    Assessment: Pharmacy consulted to manage electrolytes in a 70 yo male admitted for pneumonia with hypokalemia.   K: 2.9, Mag: 1.7 K at 1325: 3.1  Goal of Therapy:  K: 3.5-5.1 Mag: 1.7-2.4  Plan:  K 3.8 this AM, no further supplement indicated. Will recheck all  electrolytes with AM labs.  Laural Benes, Pharm.D., BCPS Clinical Pharmacist 05/21/2015    Add-on Mag level 1.8 No further supplementation needed at this time. Follow up labs in AM as above.   Rayna Sexton, PharmD, BCPS Clinical Pharmacist 05/21/2015 10:01 AM

## 2015-05-21 NOTE — Consult Note (Signed)
   [facility] CM Inpatient Consult   05/21/2015  FODAY CONE 01/14/1945 [id number]   Patient screened for potential [facility] services. Patient is eligible for [facility].Spoke with inpatient care-manager who requested that I wait and speak with patient later in the week when his discharge plan is more developed.  For questions please contact:   Zanyla Klebba RN, [facility] Liaison  [phone]) Business Mobile [phone]) Toll free office

## 2015-05-21 NOTE — Progress Notes (Signed)
Pharmacy Antibiotic Note  Rick Clark is a 70 y.o. male admitted on 05/19/2015 with pneumonia.  Pharmacy has been consulted for vancomycin & piperacillin/tazobactam dosing.  Plan: Piperacillin/tazobactam 3.375 g IV q8h EI  Will transition patient to Vancomycin 500 mg IV q18h based on kinetic parameters. Goal vancomycin level 15-20 mcg/mL Vancomycin trough ordered for 5/11 @ 1030 prior to third dose of new regimen for monitoring purposes (not at steady state).   Kinetics: Using Actual body weight of 40 kg Ke: 0.042 Half-life: ~16.5 hrs Vd: 28L  Height: '5\' 5"'$  (165.1 cm) Weight: 84 lb 8 oz (38.329 kg) IBW/kg (Calculated) : 61.5  Temp (24hrs), Avg:98.1 F (36.7 C), Min:97.6 F (36.4 C), Max:98.7 F (37.1 C)   Recent Labs Lab 05/19/15 1132 05/20/15 0429 05/21/15 0448  WBC 14.3* 13.4* 13.3*  CREATININE 0.61 0.65 0.46*    Estimated Creatinine Clearance: 45.9 mL/min (by C-G formula based on Cr of 0.46).    No Known Allergies  Antimicrobials this admission: Azithromycin & CTX 5/7 one dose vancomycin 5/7 >>  Piperacillin/tazobactam 5/7 >>  Dose adjustments this admission:  Microbiology results: 5/7 BCx: NGTD  Thank you for allowing pharmacy to be a part of this patient's care.  Loleta Dicker, PharmD Clinical Pharmacist 05/21/2015 6:09 AM

## 2015-05-22 DIAGNOSIS — C3490 Malignant neoplasm of unspecified part of unspecified bronchus or lung: Secondary | ICD-10-CM | POA: Diagnosis not present

## 2015-05-22 DIAGNOSIS — R627 Adult failure to thrive: Secondary | ICD-10-CM | POA: Diagnosis not present

## 2015-05-22 DIAGNOSIS — J189 Pneumonia, unspecified organism: Secondary | ICD-10-CM | POA: Diagnosis not present

## 2015-05-22 DIAGNOSIS — J449 Chronic obstructive pulmonary disease, unspecified: Secondary | ICD-10-CM | POA: Diagnosis not present

## 2015-05-22 LAB — BASIC METABOLIC PANEL
Anion gap: 6 (ref 5–15)
BUN: 9 mg/dL (ref 6–20)
CALCIUM: 7.8 mg/dL — AB (ref 8.9–10.3)
CO2: 27 mmol/L (ref 22–32)
CREATININE: 0.44 mg/dL — AB (ref 0.61–1.24)
Chloride: 103 mmol/L (ref 101–111)
GFR calc non Af Amer: >60 mL/min (ref >60)
Glucose, Bld: 113 mg/dL — ABNORMAL HIGH (ref 65–99)
Potassium: 3.1 mmol/L — ABNORMAL LOW (ref 3.5–5.1)
SODIUM: 136 mmol/L (ref 135–145)

## 2015-05-22 LAB — PHOSPHORUS: PHOSPHORUS: 2.2 mg/dL — AB (ref 2.5–4.6)

## 2015-05-22 LAB — MAGNESIUM: Magnesium: 1.7 mg/dL (ref 1.7–2.4)

## 2015-05-22 MED ORDER — AMOXICILLIN-POT CLAVULANATE 400-57 MG/5ML PO SUSR: 875.0000 mg | Freq: Two times a day (BID) | ORAL | Status: DC

## 2015-05-22 MED ORDER — POTASSIUM & SODIUM PHOSPHATES 280-160-250 MG PO PACK
2.0000 | PACK | Freq: Two times a day (BID) | ORAL | Status: DC
  Administered 2015-05-22: 2 via ORAL
  Filled 2015-05-22 (×3): qty 2

## 2015-05-22 MED ORDER — TAMSULOSIN HCL 0.4 MG PO CAPS: 0.4000 mg | ORAL_CAPSULE | Freq: Every day | ORAL | Status: DC

## 2015-05-22 MED ORDER — POTASSIUM CHLORIDE CRYS ER 20 MEQ PO TBCR: 40.0000 meq | EXTENDED_RELEASE_TABLET | Freq: Two times a day (BID) | ORAL | Status: DC

## 2015-05-22 MED ORDER — POTASSIUM CHLORIDE 20 MEQ PO PACK
40.0000 meq | PACK | Freq: Once | ORAL | Status: AC
  Administered 2015-05-22: 10:00:00 40 meq via ORAL
  Filled 2015-05-22: qty 2

## 2015-05-22 MED ORDER — AMOXICILLIN-POT CLAVULANATE 400-57 MG/5ML PO SUSR
875.0000 mg | Freq: Two times a day (BID) | ORAL | Status: DC
  Filled 2015-05-22 (×2): qty 10.9

## 2015-05-22 MED ORDER — POTASSIUM & SODIUM PHOSPHATES 280-160-250 MG PO PACK: 2.0000 | PACK | Freq: Two times a day (BID) | ORAL | Status: DC

## 2015-05-22 MED ORDER — GUAIFENESIN 100 MG/5ML PO SOLN: 5.0000 mL | ORAL | Status: DC | PRN

## 2015-05-22 NOTE — Discharge Instructions (Signed)
Regular diet. Activity as tolerated. Aspiration precaution.

## 2015-05-22 NOTE — Discharge Summary (Signed)
[facility] [place] NAME: Rick Clark    MR#:  [id number]  DATE OF BIRTH:  [birth year]  DATE OF ADMISSION:  05/19/2015 ADMITTING PHYSICIAN: Gladstone Lighter, MD  DATE OF DISCHARGE: 05/22/2015 10:25 AM  PRIMARY CARE PHYSICIAN: [name], MD    ADMISSION DIAGNOSIS:  CAP (community acquired pneumonia) [J18.9]   DISCHARGE DIAGNOSIS:   CAP (RLL) wh leukocytosis severe protein calorie malnutrition  SECONDARY DIAGNOSIS:   Past Medical History  Diagnosis Date  . Pneumonia Nov '15  . COPD (chronic obstructive pulmonary disease) ([place])   . Shortness of breath dyspnea   . Lung cancer [facility]) 1990    Patient states RUL Thoracotomy.  . Lung mass     new spiculated LUL lung mass    HOSPITAL COURSE:   Rick Clark is a 70 y.o. male with a known history of COPD not on home oxygen, ongoing smoking, prior history of lung cancer status post right upper lobectomy in 1990, new left upper lobe spiculated lung mass being followed by oncology presents to the hospital from home secondary to worsening cough and lower oxygen saturations. He was prescribed outpatient levaquin, but patient has trouble swallowing pills and did not take the antibiotic.  #1 CAP (RLL) wh leukocytosis. -CT of the chest revealing worsening pneumonia with possible underlying abscess. He was treated with vancomycin and Zosyn. Negative blood cultures. Off Oxygen. DuoNeb when necessary. robitussin prn. Change to po augmentin.  #2 COPD-stable.  -Nebs when necessary. Continue home inhalers.  #3 spiculated left upper lobe lung mass-following with oncology as outpatient. -No immediate need for biopsy due to risk for pneumothorax as he had in the past. Follow-up oncology as outpatient. Last CT was on 04/22/2015 and had another CT scan done today.  #4 tobacco use disorder-counseled against smoking for almost 3 minutes. on nicotine patch.  #5 failure to thrive-severe  protein calorie malnutrition noted. Continue Megace. Follow-up Dietitian consult  Hypotension. Given normal saline bolus and IV fluid support. Better. Looks like BP is low at baseline. Hypokalemia. Given potassium supplement and improved. Anemia.  decreased hemoglobin. Possible due to IV fluid dilution. Hb is stable. Physical therapy suggested no PT follow-up.   DISCHARGE CONDITIONS:  Stable, discharged to home today.  CONSULTS OBTAINED:  Treatment Team:  Lloyd Huger, MD  DRUG ALLERGIES:  No Known Allergies  DISCHARGE MEDICATIONS:   Discharge Medication List as of 05/22/2015  9:56 AM    START taking these medications   Details  amoxicillin-clavulanate (AUGMENTIN) 400-57 MG/5ML suspension Take 10.9 mLs (875 mg total) by mouth every 12 (twelve) hours., Starting 05/22/2015, Until Discontinued, Print    guaiFENesin (ROBITUSSIN) 100 MG/5ML SOLN Take 5 mLs (100 mg total) by mouth every 4 (four) hours as needed for cough or to loosen phlegm., Starting 05/22/2015, Until Discontinued, Print    potassium & sodium phosphates (PHOS-NAK) 280-160-250 MG PACK Take 2 packets by mouth 2 (two) times daily with a meal., Starting 05/22/2015, Until Discontinued, Print    tamsulosin (FLOMAX) 0.4 MG CAPS capsule Take 1 capsule (0.4 mg total) by mouth daily., Starting 05/22/2015, Until Discontinued, Print      CONTINUE these medications which have NOT CHANGED   Details  albuterol (PROVENTIL HFA;VENTOLIN HFA) 108 (90 BASE) MCG/ACT inhaler Inhale 1 puff into the lungs every 6 (six) hours as needed for wheezing or shortness of breath., Until Discontinued, Historical Med    aspirin 81 MG chewable tablet Chew 81 mg by mouth every  morning., Until Discontinued, Historical Med    budesonide-formoterol (SYMBICORT) 160-4.5 MCG/ACT inhaler Inhale 2 puffs into the lungs 2 (two) times daily., Until Discontinued, Historical Med    tiotropium (SPIRIVA) 18 MCG inhalation capsule Place 18 mcg into inhaler and  inhale daily., Until Discontinued, Historical Med      STOP taking these medications     aspirin 81 MG tablet      levofloxacin (LEVAQUIN) 500 MG tablet          DISCHARGE INSTRUCTIONS:    If you experience worsening of your admission symptoms, develop shortness of breath, life threatening emergency, suicidal or homicidal thoughts you must seek medical attention immediately by calling 911 or calling your MD immediately  if symptoms less severe.  You Must read complete instructions/literature along with all the possible adverse reactions/side effects for all the Medicines you take and that have been prescribed to you. Take any new Medicines after you have completely understood and accept all the possible adverse reactions/side effects.   Please note  You were cared for by a hospitalist during your hospital stay. If you have any questions about your discharge medications or the care you received while you were in the hospital after you are discharged, you can call the unit and asked to speak with the hospitalist on call if the hospitalist that took care of you is not available. Once you are discharged, your primary care physician will handle any further medical issues. Please note that NO REFILLS for any discharge medications will be authorized once you are discharged, as it is imperative that you return to your primary care physician (or establish a relationship with a primary care physician if you do not have one) for your aftercare needs so that they can reassess your need for medications and monitor your lab values.    Today   SUBJECTIVE   No complaint.   VITAL SIGNS:  Blood pressure 125/49, pulse 69, temperature 97.9 F (36.6 C), temperature source Oral, resp. rate 18, height '5\' 5"'$  (1.651 m), weight 38.329 kg (84 lb 8 oz), SpO2 93 %.  I/O:   Intake/Output Summary (Last 24 hours) at 05/22/15 1319 Last data filed at 05/22/15 0900  Gross per 24 hour  Intake    240 ml   Output    600 ml  Net   -360 ml    PHYSICAL EXAMINATION:  GENERAL:  70 y.o.-year-old patient lying in the bed with no acute distress. severe malnutrition. EYES: Pupils equal, round, reactive to light and accommodation. No scleral icterus. Extraocular muscles intact.  HEENT: Head atraumatic, normocephalic. Oropharynx and nasopharynx clear.  NECK:  Supple, no jugular venous distention. No thyroid enlargement, no tenderness.  LUNGS: Normal breath sounds bilaterally, no wheezing, rales,rhonchi or crepitation. No use of accessory muscles of respiration.  CARDIOVASCULAR: S1, S2 normal. No murmurs, rubs, or gallops.  ABDOMEN: Soft, non-tender, non-distended. Bowel sounds present. No organomegaly or mass.  EXTREMITIES: No pedal edema, cyanosis, or clubbing.  NEUROLOGIC: Cranial nerves II through XII are intact. Muscle strength 5/5 in all extremities. Sensation intact. Gait not checked.  PSYCHIATRIC: The patient is alert and oriented x 3.  SKIN: No obvious rash, lesion, or ulcer.   DATA REVIEW:   CBC  Recent Labs Lab 05/21/15 0448  WBC 13.3*  HGB 10.0*  HCT 30.7*  PLT 398    Chemistries   Recent Labs Lab 05/19/15 1132  05/22/15 0418  NA 135  < > 136  K 3.6  < > 3.1*  CL 97*  < > 103  CO2 28  < > 27  GLUCOSE 126*  < > 113*  BUN 16  < > 9  CREATININE 0.61  < > 0.44*  CALCIUM 8.3*  < > 7.8*  MG  --   < > 1.7  AST 55*  --   --   ALT 48  --   --   ALKPHOS 119  --   --   BILITOT 0.6  --   --   < > = values in this interval not displayed.  Cardiac Enzymes  Recent Labs Lab 05/19/15 1132  [place] <0.03    Microbiology Results  Results for orders placed or performed during the hospital encounter of 05/19/15  Blood culture (routine x 2)     Status: None (Preliminary result)   Collection Time: 05/19/15  1:05 PM  Result Value Ref Range Status   Specimen Description BLOOD RIGHT FOREARM  Final   Special Requests BOTTLES DRAWN AEROBIC AND ANAEROBIC  1CC  Final   Culture  NO GROWTH 3 DAYS  Final   Report Status PENDING  Incomplete  Blood culture (routine x 2)     Status: None (Preliminary result)   Collection Time: 05/19/15  1:05 PM  Result Value Ref Range Status   Specimen Description BLOOD LEFT ARM  Final   Special Requests BOTTLES DRAWN AEROBIC AND ANAEROBIC  1CC  Final   Culture NO GROWTH 3 DAYS  Final   Report Status PENDING  Incomplete    RADIOLOGY:  No results found.      Management plans discussed with the patient, his wife and daughter and they are in agreement.  CODE STATUS:     Code Status Orders        Start     Ordered   05/19/15 1627  Full code   Continuous     05/19/15 1626    Code Status History    Date Active Date Inactive Code Status Order ID Comments User Context   06/25/2014  2:53 PM 07/02/2014  8:33 PM Full Code [id number]  Inez Catalina, MD Inpatient   06/22/2014 10:59 PM 06/25/2014  2:53 PM Full Code [id number]  Harrie Foreman, MD ED    Advance Directive Documentation        Most Recent Value   Type of Advance Directive  Healthcare Power of Attorney   Pre-existing out of facility DNR order (yellow form or pink MOST form)     "MOST" Form in Place?        TOTAL TIME TAKING CARE OF THIS PATIENT: 35 minutes.    Demetrios Loll M.D on 05/22/2015 at 1:19 PM  Between 7am to 6pm - Pager - [phone]  After 6pm go to [email or web address] - password [email or web address] [facility]  Office  [phone]  CC: Primary care physician; [name], MD

## 2015-05-22 NOTE — Progress Notes (Signed)
Discharge instructions given and went over with patient and daughter at bedside. Prescriptions given. All questions answered. Patient discharged home with family via wheelchair by nursing staff. Madlyn Frankel, RN

## 2015-05-22 NOTE — Progress Notes (Addendum)
Electrolyte CONSULT NOTE - Follow Up  Pharmacy Consult for Electrolyte Supplementation Indication: Hypokalemia  No Known Allergies  Patient Measurements: Height: '5\' 5"'$  (165.1 cm) Weight: 84 lb 8 oz (38.329 kg) IBW/kg (Calculated) : 61.5   Vital Signs: Temp: 97.9 F (36.6 C) (05/10 0436) Temp Source: Oral (05/10 0436) BP: 125/49 mmHg (05/10 0436) Pulse Rate: 69 (05/10 0436) Intake/Output from previous day: 05/09 0701 - 05/10 0700 In: 120 [P.O.:120] Out: 700 [Urine:700] Intake/Output from this shift:    Labs:  Recent Labs  05/19/15 1132 05/20/15 0429 05/21/15 0448 05/22/15 0418  WBC 14.3* 13.4* 13.3*  --   HGB 12.0* 10.3* 10.0*  --   HCT 37.8* 31.5* 30.7*  --   PLT 507* 395 398  --   CREATININE 0.61 0.65 0.46* 0.44*  MG  --  1.7 1.8 1.7  PHOS  --   --   --  2.2*  ALBUMIN 2.1*  --   --   --   PROT 7.3  --   --   --   AST 55*  --   --   --   ALT 48  --   --   --   ALKPHOS 119  --   --   --   BILITOT 0.6  --   --   --    Estimated Creatinine Clearance: 45.9 mL/min (by C-G formula based on Cr of 0.44).   Microbiology: Recent Results (from the past 720 hour(s))  Blood culture (routine x 2)     Status: None (Preliminary result)   Collection Time: 05/19/15  1:05 PM  Result Value Ref Range Status   Specimen Description BLOOD RIGHT FOREARM  Final   Special Requests BOTTLES DRAWN AEROBIC AND ANAEROBIC  1CC  Final   Culture NO GROWTH 2 DAYS  Final   Report Status PENDING  Incomplete  Blood culture (routine x 2)     Status: None (Preliminary result)   Collection Time: 05/19/15  1:05 PM  Result Value Ref Range Status   Specimen Description BLOOD LEFT ARM  Final   Special Requests BOTTLES DRAWN AEROBIC AND ANAEROBIC  1CC  Final   Culture NO GROWTH 2 DAYS  Final   Report Status PENDING  Incomplete    Medical History: Past Medical History  Diagnosis Date  . Pneumonia Nov '15  . COPD (chronic obstructive pulmonary disease) ([place])   . Shortness of breath dyspnea    . Lung cancer [facility]) 1990    Patient states RUL Thoracotomy.  . Lung mass     new spiculated LUL lung mass    Medications:  Scheduled:  . antiseptic oral rinse  7 mL Mouth Rinse BID  . aspirin EC  81 mg Oral Daily  . enoxaparin (LOVENOX) injection  30 mg Subcutaneous Q24H  . feeding supplement (ENSURE ENLIVE)  237 mL Oral TID BM  . megestrol  400 mg Oral Daily  . mometasone-formoterol  2 puff Inhalation BID  . nicotine  14 mg Transdermal Daily  . piperacillin-tazobactam (ZOSYN)  IV  3.375 g Intravenous Q8H  . potassium & sodium phosphates  2 packet Oral BID WC  . potassium chloride  40 mEq Oral BID WC  . tamsulosin  0.4 mg Oral Daily  . tiotropium  18 mcg Inhalation Daily   Infusions:     Assessment: Pharmacy consulted to manage electrolytes in a 70 yo male admitted for pneumonia with hypokalemia.   K: 2.9, Mag: 1.7 K at 1325: 3.1  Goal  of Therapy:  K: 3.5-5.1 Mag: 1.7-2.4  Plan:  K 3.1, Mg 1.7, Phos 2.2 this AM. Ordered potassium chloride 40 mEq po BID with meals x 2 doses and PhosNak 2 packets BID with meals x 2 doses. Will recheck all electrolytes with AM labs.  Reviewed speech notes and patient prefers meds crushed in applesauce.  Will transition to KCl powder packet 40 meq po once and recheck potassium at 1800.  Murrell Converse, PharmD Clinical Pharmacist 05/22/2015

## 2015-05-22 NOTE — Progress Notes (Signed)
Electrolyte CONSULT NOTE - Follow Up  Pharmacy Consult for Electrolyte Supplementation Indication: Hypokalemia  No Known Allergies  Patient Measurements: Height: '5\' 5"'$  (165.1 cm) Weight: 84 lb 8 oz (38.329 kg) IBW/kg (Calculated) : 61.5   Vital Signs: Temp: 97.9 F (36.6 C) (05/10 0436) Temp Source: Oral (05/10 0436) BP: 125/49 mmHg (05/10 0436) Pulse Rate: 69 (05/10 0436) Intake/Output from previous day: 05/09 0701 - 05/10 0700 In: 120 [P.O.:120] Out: 700 [Urine:700] Intake/Output from this shift: Total I/O In: -  Out: 600 [Urine:600]  Labs:  Recent Labs  05/19/15 1132 05/20/15 0429 05/21/15 0448 05/22/15 0418  WBC 14.3* 13.4* 13.3*  --   HGB 12.0* 10.3* 10.0*  --   HCT 37.8* 31.5* 30.7*  --   PLT 507* 395 398  --   CREATININE 0.61 0.65 0.46* 0.44*  MG  --  1.7 1.8 1.7  PHOS  --   --   --  2.2*  ALBUMIN 2.1*  --   --   --   PROT 7.3  --   --   --   AST 55*  --   --   --   ALT 48  --   --   --   ALKPHOS 119  --   --   --   BILITOT 0.6  --   --   --    Estimated Creatinine Clearance: 45.9 mL/min (by C-G formula based on Cr of 0.44).   Microbiology: Recent Results (from the past 720 hour(s))  Blood culture (routine x 2)     Status: None (Preliminary result)   Collection Time: 05/19/15  1:05 PM  Result Value Ref Range Status   Specimen Description BLOOD RIGHT FOREARM  Final   Special Requests BOTTLES DRAWN AEROBIC AND ANAEROBIC  1CC  Final   Culture NO GROWTH 2 DAYS  Final   Report Status PENDING  Incomplete  Blood culture (routine x 2)     Status: None (Preliminary result)   Collection Time: 05/19/15  1:05 PM  Result Value Ref Range Status   Specimen Description BLOOD LEFT ARM  Final   Special Requests BOTTLES DRAWN AEROBIC AND ANAEROBIC  1CC  Final   Culture NO GROWTH 2 DAYS  Final   Report Status PENDING  Incomplete    Medical History: Past Medical History  Diagnosis Date  . Pneumonia Nov '15  . COPD (chronic obstructive pulmonary disease)  ([place])   . Shortness of breath dyspnea   . Lung cancer [facility]) 1990    Patient states RUL Thoracotomy.  . Lung mass     new spiculated LUL lung mass    Medications:  Scheduled:  . antiseptic oral rinse  7 mL Mouth Rinse BID  . aspirin EC  81 mg Oral Daily  . enoxaparin (LOVENOX) injection  30 mg Subcutaneous Q24H  . feeding supplement (ENSURE ENLIVE)  237 mL Oral TID BM  . megestrol  400 mg Oral Daily  . mometasone-formoterol  2 puff Inhalation BID  . nicotine  14 mg Transdermal Daily  . piperacillin-tazobactam (ZOSYN)  IV  3.375 g Intravenous Q8H  . tamsulosin  0.4 mg Oral Daily  . tiotropium  18 mcg Inhalation Daily   Infusions:     Assessment: Pharmacy consulted to manage electrolytes in a 70 yo male admitted for pneumonia with hypokalemia.   K: 2.9, Mag: 1.7 K at 1325: 3.1  Goal of Therapy:  K: 3.5-5.1 Mag: 1.7-2.4  Plan:  K 3.1, Mg 1.7, Phos 2.2  this AM. Ordered potassium chloride 40 mEq po BID with meals x 2 doses and PhosNak 2 packets BID with meals x 2 doses. Will recheck all electrolytes with AM labs.  Jalani Cullifer A. Ag[place], [place].D., BCPS Clinical Pharmacist 05/22/2015 6:59 AM

## 2015-05-24 LAB — CULTURE, BLOOD (ROUTINE X 2)
CULTURE: NO GROWTH
CULTURE: NO GROWTH

## 2015-06-07 ENCOUNTER — Other Ambulatory Visit: Payer: Self-pay | Admitting: Physician Assistant

## 2015-06-07 DIAGNOSIS — D381 Neoplasm of uncertain behavior of trachea, bronchus and lung: Secondary | ICD-10-CM

## 2015-06-07 DIAGNOSIS — J90 Pleural effusion, not elsewhere classified: Secondary | ICD-10-CM | POA: Diagnosis not present

## 2015-06-07 DIAGNOSIS — J431 Panlobular emphysema: Secondary | ICD-10-CM | POA: Diagnosis not present

## 2015-06-07 DIAGNOSIS — R634 Abnormal weight loss: Secondary | ICD-10-CM | POA: Diagnosis not present

## 2015-06-19 ENCOUNTER — Ambulatory Visit: Payer: PPO | Admitting: Oncology

## 2015-06-21 ENCOUNTER — Ambulatory Visit: Payer: PPO

## 2015-06-24 ENCOUNTER — Emergency Department: Payer: PPO

## 2015-06-24 ENCOUNTER — Ambulatory Visit
Admission: RE | Admit: 2015-06-24 | Discharge: 2015-06-24 | Disposition: A | Discharge status: Home / Self Care | Payer: PPO | Source: Ambulatory Visit | Attending: Physician Assistant | Admitting: Physician Assistant

## 2015-06-24 ENCOUNTER — Inpatient Hospital Stay
Admission: EM | Admit: 2015-06-24 | Discharge: 2015-06-28 | DRG: 199 | Disposition: A | Discharge status: Home / Self Care | Payer: PPO | Attending: Cardiothoracic Surgery | Admitting: Cardiothoracic Surgery

## 2015-06-24 DIAGNOSIS — R634 Abnormal weight loss: Secondary | ICD-10-CM | POA: Diagnosis not present

## 2015-06-24 DIAGNOSIS — E43 Unspecified severe protein-calorie malnutrition: Secondary | ICD-10-CM | POA: Diagnosis not present

## 2015-06-24 DIAGNOSIS — J9819 Other pulmonary collapse: Secondary | ICD-10-CM | POA: Insufficient documentation

## 2015-06-24 DIAGNOSIS — Z7951 Long term (current) use of inhaled steroids: Secondary | ICD-10-CM

## 2015-06-24 DIAGNOSIS — J449 Chronic obstructive pulmonary disease, unspecified: Secondary | ICD-10-CM | POA: Diagnosis not present

## 2015-06-24 DIAGNOSIS — J9383 Other pneumothorax: Principal | ICD-10-CM | POA: Diagnosis present

## 2015-06-24 DIAGNOSIS — J9811 Atelectasis: Secondary | ICD-10-CM | POA: Insufficient documentation

## 2015-06-24 DIAGNOSIS — R627 Adult failure to thrive: Secondary | ICD-10-CM | POA: Diagnosis not present

## 2015-06-24 DIAGNOSIS — Z79899 Other long term (current) drug therapy: Secondary | ICD-10-CM

## 2015-06-24 DIAGNOSIS — J9601 Acute respiratory failure with hypoxia: Secondary | ICD-10-CM | POA: Diagnosis present

## 2015-06-24 DIAGNOSIS — Z85118 Personal history of other malignant neoplasm of bronchus and lung: Secondary | ICD-10-CM | POA: Diagnosis not present

## 2015-06-24 DIAGNOSIS — E876 Hypokalemia: Secondary | ICD-10-CM | POA: Diagnosis present

## 2015-06-24 DIAGNOSIS — Z8249 Family history of ischemic heart disease and other diseases of the circulatory system: Secondary | ICD-10-CM

## 2015-06-24 DIAGNOSIS — R918 Other nonspecific abnormal finding of lung field: Secondary | ICD-10-CM

## 2015-06-24 DIAGNOSIS — J939 Pneumothorax, unspecified: Secondary | ICD-10-CM

## 2015-06-24 DIAGNOSIS — D381 Neoplasm of uncertain behavior of trachea, bronchus and lung: Secondary | ICD-10-CM

## 2015-06-24 DIAGNOSIS — Z902 Acquired absence of lung [part of]: Secondary | ICD-10-CM | POA: Diagnosis not present

## 2015-06-24 DIAGNOSIS — F17210 Nicotine dependence, cigarettes, uncomplicated: Secondary | ICD-10-CM | POA: Diagnosis present

## 2015-06-24 DIAGNOSIS — Z681 Body mass index (BMI) 19 or less, adult: Secondary | ICD-10-CM

## 2015-06-24 DIAGNOSIS — R0602 Shortness of breath: Secondary | ICD-10-CM | POA: Diagnosis not present

## 2015-06-24 DIAGNOSIS — C3412 Malignant neoplasm of upper lobe, left bronchus or lung: Secondary | ICD-10-CM | POA: Diagnosis present

## 2015-06-24 DIAGNOSIS — Z7982 Long term (current) use of aspirin: Secondary | ICD-10-CM

## 2015-06-24 DIAGNOSIS — Z833 Family history of diabetes mellitus: Secondary | ICD-10-CM | POA: Diagnosis not present

## 2015-06-24 DIAGNOSIS — Z4682 Encounter for fitting and adjustment of non-vascular catheter: Secondary | ICD-10-CM | POA: Diagnosis not present

## 2015-06-24 DIAGNOSIS — C3490 Malignant neoplasm of unspecified part of unspecified bronchus or lung: Secondary | ICD-10-CM | POA: Diagnosis not present

## 2015-06-24 DIAGNOSIS — G939 Disorder of brain, unspecified: Secondary | ICD-10-CM | POA: Diagnosis not present

## 2015-06-24 DIAGNOSIS — Z09 Encounter for follow-up examination after completed treatment for conditions other than malignant neoplasm: Secondary | ICD-10-CM

## 2015-06-24 LAB — COMPREHENSIVE METABOLIC PANEL
ALT: 16 U/L — ABNORMAL LOW (ref 17–63)
AST: 24 U/L (ref 15–41)
Albumin: 3.1 g/dL — ABNORMAL LOW (ref 3.5–5.0)
Alkaline Phosphatase: 107 U/L (ref 38–126)
Anion gap: 11 (ref 5–15)
BILIRUBIN TOTAL: 0.5 mg/dL (ref 0.3–1.2)
BUN: 8 mg/dL (ref 6–20)
CO2: 33 mmol/L — ABNORMAL HIGH (ref 22–32)
Calcium: 8.9 mg/dL (ref 8.9–10.3)
Chloride: 92 mmol/L — ABNORMAL LOW (ref 101–111)
Creatinine, Ser: 0.61 mg/dL (ref 0.61–1.24)
GFR calc Af Amer: >60 mL/min (ref >60)
Glucose, Bld: 221 mg/dL — ABNORMAL HIGH (ref 65–99)
POTASSIUM: 2.8 mmol/L — AB (ref 3.5–5.1)
Sodium: 136 mmol/L (ref 135–145)
TOTAL PROTEIN: 8.4 g/dL — AB (ref 6.5–8.1)

## 2015-06-24 LAB — CBC WITH DIFFERENTIAL/PLATELET
BASOS ABS: 0.1 10*3/uL (ref 0–0.1)
Basophils Relative: 0 %
Eosinophils Absolute: 0.2 10*3/uL (ref 0–0.7)
Eosinophils Relative: 2 %
HEMATOCRIT: 41.6 % (ref 40.0–52.0)
Hemoglobin: 13.2 g/dL (ref 13.0–18.0)
LYMPHS ABS: 2.8 10*3/uL (ref 1.0–3.6)
MCH: 25 pg — ABNORMAL LOW (ref 26.0–34.0)
MCHC: 31.7 g/dL — AB (ref 32.0–36.0)
MCV: 78.8 fL — AB (ref 80.0–100.0)
MONO ABS: 0.7 10*3/uL (ref 0.2–1.0)
NEUTROS ABS: 12.4 10*3/uL — AB (ref 1.4–6.5)
Neutrophils Relative %: 77 %
Platelets: 488 10*3/uL — ABNORMAL HIGH (ref 150–440)
RBC: 5.28 MIL/uL (ref 4.40–5.90)
RDW: 17.3 % — AB (ref 11.5–14.5)
WBC: 16.1 10*3/uL — ABNORMAL HIGH (ref 3.8–10.6)

## 2015-06-24 LAB — MAGNESIUM: Magnesium: 1.8 mg/dL (ref 1.7–2.4)

## 2015-06-24 LAB — TROPONIN I

## 2015-06-24 MED ORDER — LIDOCAINE HCL (PF) 1 % IJ SOLN
5.0000 mL | Freq: Once | INTRAMUSCULAR | Status: AC
  Administered 2015-06-24: 5 mL via INTRADERMAL

## 2015-06-24 MED ORDER — SODIUM CHLORIDE 0.9 % IV SOLN
Freq: Once | INTRAVENOUS | Status: AC
  Administered 2015-06-24: 14:00:00 via INTRAVENOUS

## 2015-06-24 MED ORDER — MORPHINE SULFATE (PF) 4 MG/ML IV SOLN
4.0000 mg | Freq: Once | INTRAVENOUS | Status: AC
  Administered 2015-06-24: 4 mg via INTRAVENOUS
  Filled 2015-06-24: qty 1

## 2015-06-24 MED ORDER — ALBUTEROL SULFATE (2.5 MG/3ML) 0.083% IN NEBU
3.0000 mL | INHALATION_SOLUTION | Freq: Four times a day (QID) | RESPIRATORY_TRACT | Status: DC | PRN
  Administered 2015-06-25: 3 mL via RESPIRATORY_TRACT
  Filled 2015-06-24: qty 3

## 2015-06-24 MED ORDER — DEXTROSE-NACL 5-0.45 % IV SOLN
INTRAVENOUS | Status: DC
  Administered 2015-06-24: 18:00:00 via INTRAVENOUS

## 2015-06-24 MED ORDER — LIDOCAINE HCL (PF) 1 % IJ SOLN
INTRAMUSCULAR | Status: AC
  Administered 2015-06-24: 5 mL via INTRADERMAL
  Filled 2015-06-24: qty 15

## 2015-06-24 MED ORDER — GUAIFENESIN 100 MG/5ML PO SOLN
5.0000 mL | ORAL | Status: DC | PRN
  Administered 2015-06-26: 100 mg via ORAL
  Filled 2015-06-24 (×2): qty 5

## 2015-06-24 MED ORDER — ENOXAPARIN SODIUM 30 MG/0.3ML ~~LOC~~ SOLN
30.0000 mg | SUBCUTANEOUS | Status: DC
  Administered 2015-06-24: 30 mg via SUBCUTANEOUS
  Filled 2015-06-24: qty 0.3

## 2015-06-24 MED ORDER — MORPHINE SULFATE (PF) 2 MG/ML IV SOLN
2.0000 mg | INTRAVENOUS | Status: DC | PRN
  Administered 2015-06-24 – 2015-06-25 (×4): 2 mg via INTRAVENOUS
  Filled 2015-06-24 (×4): qty 1

## 2015-06-24 MED ORDER — MOMETASONE FURO-FORMOTEROL FUM 200-5 MCG/ACT IN AERO
2.0000 | INHALATION_SPRAY | Freq: Two times a day (BID) | RESPIRATORY_TRACT | Status: DC
  Administered 2015-06-24 – 2015-06-28 (×8): 2 via RESPIRATORY_TRACT
  Filled 2015-06-24: qty 8.8

## 2015-06-24 MED ORDER — ACETAMINOPHEN 325 MG PO TABS: 650.0000 mg | ORAL_TABLET | Freq: Four times a day (QID) | ORAL | Status: DC | PRN

## 2015-06-24 MED ORDER — POTASSIUM & SODIUM PHOSPHATES 280-160-250 MG PO PACK
2.0000 | PACK | Freq: Two times a day (BID) | ORAL | Status: DC
  Administered 2015-06-24 – 2015-06-28 (×7): 2 via ORAL
  Filled 2015-06-24 (×7): qty 2

## 2015-06-24 MED ORDER — BISACODYL 5 MG PO TBEC
10.0000 mg | DELAYED_RELEASE_TABLET | Freq: Every day | ORAL | Status: DC
  Administered 2015-06-25 – 2015-06-26 (×2): 10 mg via ORAL
  Filled 2015-06-24 (×2): qty 2

## 2015-06-24 MED ORDER — OXYCODONE HCL 5 MG PO TABS: 5.0000 mg | ORAL_TABLET | ORAL | Status: DC | PRN

## 2015-06-24 MED ORDER — IOPAMIDOL (ISOVUE-300) INJECTION 61%
75.0000 mL | Freq: Once | INTRAVENOUS | Status: AC | PRN
  Administered 2015-06-24: 75 mL via INTRAVENOUS

## 2015-06-24 MED ORDER — LIDOCAINE HCL (PF) 1 % IJ SOLN
30.0000 mL | Freq: Once | INTRAMUSCULAR | Status: DC
  Filled 2015-06-24: qty 30

## 2015-06-24 MED ORDER — ONDANSETRON HCL 4 MG/2ML IJ SOLN: 4.0000 mg | Freq: Four times a day (QID) | INTRAMUSCULAR | Status: DC | PRN

## 2015-06-24 MED ORDER — POTASSIUM CHLORIDE 10 MEQ/100ML IV SOLN
10.0000 meq | INTRAVENOUS | Status: AC
  Administered 2015-06-24 (×4): 10 meq via INTRAVENOUS
  Filled 2015-06-24 (×4): qty 100

## 2015-06-24 MED ORDER — OXYCODONE HCL 5 MG/5ML PO SOLN
5.0000 mg | ORAL | Status: DC | PRN
  Administered 2015-06-25 – 2015-06-27 (×4): 5 mg via ORAL
  Filled 2015-06-24 (×4): qty 5

## 2015-06-24 MED ORDER — ENSURE ENLIVE PO LIQD
237.0000 mL | Freq: Two times a day (BID) | ORAL | Status: DC
  Administered 2015-06-25 – 2015-06-27 (×4): 237 mL via ORAL

## 2015-06-24 MED ORDER — TIOTROPIUM BROMIDE MONOHYDRATE 18 MCG IN CAPS
18.0000 ug | ORAL_CAPSULE | Freq: Every day | RESPIRATORY_TRACT | Status: DC
  Administered 2015-06-25 – 2015-06-28 (×4): 18 ug via RESPIRATORY_TRACT
  Filled 2015-06-24: qty 5

## 2015-06-24 MED ORDER — POTASSIUM CHLORIDE 20 MEQ/15ML (10%) PO SOLN
40.0000 meq | Freq: Once | ORAL | Status: AC
  Administered 2015-06-24: 40 meq via ORAL
  Filled 2015-06-24 (×3): qty 30

## 2015-06-24 MED ORDER — ENOXAPARIN SODIUM 40 MG/0.4ML ~~LOC~~ SOLN: 40.0000 mg | SUBCUTANEOUS | Status: DC

## 2015-06-24 MED ORDER — ASPIRIN 81 MG PO CHEW
81.0000 mg | CHEWABLE_TABLET | ORAL | Status: DC
  Administered 2015-06-25 – 2015-06-28 (×4): 81 mg via ORAL
  Filled 2015-06-24 (×4): qty 1

## 2015-06-24 MED ORDER — ALBUTEROL SULFATE (2.5 MG/3ML) 0.083% IN NEBU
2.5000 mg | INHALATION_SOLUTION | RESPIRATORY_TRACT | Status: DC
  Administered 2015-06-24 – 2015-06-28 (×14): 2.5 mg via RESPIRATORY_TRACT
  Filled 2015-06-24 (×15): qty 3

## 2015-06-24 MED ORDER — TAMSULOSIN HCL 0.4 MG PO CAPS
0.4000 mg | ORAL_CAPSULE | Freq: Every day | ORAL | Status: DC
  Administered 2015-06-27 – 2015-06-28 (×2): 0.4 mg via ORAL
  Filled 2015-06-24 (×2): qty 1

## 2015-06-24 NOTE — ED Notes (Signed)
MD at bedside. 

## 2015-06-24 NOTE — Progress Notes (Signed)
Anticoagulation monitoring(Lovenox):  69yo M ordered Lovenox 40 mg Q24h  Filed Weights   06/24/15 1128  Weight: 89 lb (40.37 kg)   BMI 14.8   Lab Results  Component Value Date   CREATININE 0.61 06/24/2015   CREATININE 0.44* 05/22/2015   CREATININE 0.46* 05/21/2015   Estimated Creatinine Clearance: 48.4 mL/min (by C-G formula based on Cr of 0.61). Hemoglobin & Hematocrit     Component Value Date/Time   HGB 13.2 06/24/2015 1148   HGB 11.9* 11/22/2013 0625   HCT 41.6 06/24/2015 1148   HCT 37.1* 11/22/2013 0625    Pt wt= 40.4 kg   Per Protocol for Patient with estCrcl > 30 ml/min and low weight male (< 57 kg), will transition to Lovenox 30 mg Q24h      Chinita Greenland PharmD Clinical Pharmacist 06/24/2015

## 2015-06-24 NOTE — Progress Notes (Signed)
Patient ID: Rick Clark, male   DOB: Feb 03, 1945, 70 y.o.   MRN: [id number]  Chief Complaint  Patient presents with  . Shortness of Breath    HPI Rick Clark is a 70 y.o. male.  He presented to the outpatient radiology department today for a routine CT scan. He is a patient of Dr. said.Kohn's and a chest CT was ordered to follow-up on a left upper lobe nodule. The patient was discharged from the outpatient radiology suite when he was called back within the hour after the chest CT showed a left-sided pneumothorax. The patient himself states that he is not short of breath anymore than he is above his baseline. He has severe COPD and over the last 3-4 days has been complaining of some tightness in his left chest but nothing in the last day or so which was different. He states that he was at the beach over the weekend and was able to walk without any significant limitations. He's had no additional weight loss or hemoptysis. He does smoke at least a half a pack cigarettes a day. He has had a previous right upper lobectomy according to the patient at [facility] about 20-25 years ago. The CT scan does show a 10-15% pneumothorax in the the patient is essentially asymptomatic at this time because of his severe underlying lung disease I did feel it was prudent to insert a pigtail catheter. I explained this to the patient and his family and they agreed. This be dictated under separate cover. Patient is currently being admitted for management of his spontaneous pneumothorax.   Past Medical History  Diagnosis Date  . Pneumonia Nov '15  . COPD (chronic obstructive pulmonary disease) ([place])   . Shortness of breath dyspnea   . Lung cancer [facility]) 1990    Patient states RUL Thoracotomy.  . Lung mass     new spiculated LUL lung mass    Past Surgical History  Procedure Laterality Date  . Lung lobectomy Right     Right upper lobe  . Flexible bronchoscopy Bilateral 09/18/2014    Procedure: FLEXIBLE  BRONCHOSCOPY;  Surgeon: Allyne Gee, MD;  Location: [facility] ORS;  Service: Pulmonary;  Laterality: Bilateral;    Family History  Problem Relation Age of Onset  . Coronary artery disease Father   . Diabetes Mother     Social History Social History  Substance Use Topics  . Smoking status: Current Every Day Smoker -- 0.50 packs/day for 55 years    Types: Cigarettes  . Smokeless tobacco: Never Used  . Alcohol Use: No    No Known Allergies  Current Facility-Administered Medications  Medication Dose Route Frequency Provider Last Rate Last Dose  . 0.9 %  sodium chloride infusion   Intravenous Once Hinda Kehr, MD      . potassium chloride 10 mEq in 100 mL IVPB  10 mEq Intravenous Q1 Hr x 4 Hinda Kehr, MD       Current Outpatient Prescriptions  Medication Sig Dispense Refill  . albuterol (PROVENTIL HFA;VENTOLIN HFA) 108 (90 BASE) MCG/ACT inhaler Inhale 1 puff into the lungs every 6 (six) hours as needed for wheezing or shortness of breath.    Marland Kitchen aspirin 81 MG chewable tablet Chew 81 mg by mouth every morning.    . budesonide-formoterol (SYMBICORT) 160-4.5 MCG/ACT inhaler Inhale 2 puffs into the lungs 2 (two) times daily.    Marland Kitchen tiotropium (SPIRIVA) 18 MCG inhalation capsule Place 18 mcg into inhaler and inhale daily.    Marland Kitchen  amoxicillin-clavulanate (AUGMENTIN) 400-57 MG/5ML suspension Take 10.9 mLs (875 mg total) by mouth every 12 (twelve) hours. (Patient not taking: Reported on 06/24/2015) 150 mL 0  . guaiFENesin (ROBITUSSIN) 100 MG/5ML SOLN Take 5 mLs (100 mg total) by mouth every 4 (four) hours as needed for cough or to loosen phlegm. (Patient not taking: Reported on 06/24/2015) 118 mL 0  . potassium & sodium phosphates (PHOS-NAK) 280-160-250 MG PACK Take 2 packets by mouth 2 (two) times daily with a meal. (Patient not taking: Reported on 06/24/2015) 6 packet 0  . tamsulosin (FLOMAX) 0.4 MG CAPS capsule Take 1 capsule (0.4 mg total) by mouth daily. (Patient not taking: Reported on 06/24/2015) 30  capsule 0    Location, Quality, Duration, Severity, Timing, Context, Modifying Factors, Associated Signs and Symptoms.  Review of Systems A complete review of systems was asked and was negative except for the following positive findingsNo weight loss, fevers, chills, hemoptysis. He does states had some vague left-sided chest pressure for the last 3-4 days.  Blood pressure 114/52, pulse 89, temperature 97.5 F (36.4 C), temperature source Oral, resp. rate 25, height '5\' 5"'$  (1.651 m), weight 89 lb (40.37 kg), SpO2 95 %.  Physical Exam CONSTITUTIONAL:  Pleasant, well-developed, well-nourished, and in no acute distress. EYES: Pupils equal and reactive to light, Sclera non-icteric EARS, NOSE, MOUTH AND THROAT:  The oropharynx was clear.  Edentulous.  Oral mucosa pink and moist. LYMPH NODES:  Lymph nodes in the neck and axillae were normal RESPIRATORY:  Lungs were clear.  Normal respiratory effort without pathologic use of accessory muscles of respiration CARDIOVASCULAR: Heart was regular without murmurs.  There were no carotid bruits. GI: The abdomen was soft, nontender, and nondistended. There were no palpable masses. There was no hepatosplenomegaly. There were normal bowel sounds in all quadrants. GU:   MUSCULOSKELETAL:  Normal muscle strength and tone.  No clubbing or cyanosis.   SKIN:  There were no pathologic skin lesions.  There were no nodules on palpation. NEUROLOGIC:  Sensation is normal.  Cranial nerves are grossly intact. PSYCH:  Oriented to person, place and time.  Mood and affect are normal.  Data Reviewed CT scan the chest shows attendant 15% pneumothorax on the left.  I have personally reviewed the patient's imaging and medical records.    Assessment    Left-sided pneumothorax. A percutaneous pigtail catheter was inserted into the left chest under sterile technique. The chest x-ray after insertion of the tube revealed the lung to be completely expanded. There is initially an  air leak but none after the lung was completely expanded.    Plan    We will admit the patient to the hospital. Will ask Dr. said.Yancey Flemings did see the patient as well as our hospitalist. Mr. Rick Clark and his family are in agreement.        [name], MD 06/24/2015, 1:53 PM

## 2015-06-24 NOTE — ED Notes (Signed)
Cardiac thoracic at bedside

## 2015-06-24 NOTE — ED Provider Notes (Signed)
[facility] Emergency Department Provider Note  ____________________________________________  Time seen: Approximately 11:42 AM  I have reviewed the triage vital signs and the nursing notes.   HISTORY  Chief Complaint Shortness of Breath    HPI Rick Clark is a 70 y.o. male who presents with complaint of left sided pneumothorax after having an outpatient CT scan of the chest today.  He has a complicated medical history that includes prior right sided lung cancer status post right upper lobectomy at [facility] years ago, COPD who is not on chronic oxygen, and a recently discovered left upper lobe mass concerning for neoplasm.  He was hospitalized approximately one month ago for pneumonia and is currently under the care of Dr. Humphrey Rolls as well as Dr. Grayland Ormond for oncology.  They are reluctant toperform a lung biopsy because they are concerned about a resultant pneumothorax.  He received a CT scan today as a scheduled outpatient procedure not because of new symptoms but because of surveillance of the mass.  He reports that he has been in his usual state of health but over the last few days has felt a slight increase in his baseline shortness of breath that is worse with exertion and better with rest.  He also describes a feeling of fullness or tightness in the left side of his chest that is very mild but different from baseline.  He denies any pain.  He denies fever/chills, nausea, vomiting, diarrhea, abdominal pain.  Speaking in full sentences and was ambulatory into the emergency department.   Past Medical History  Diagnosis Date  . Pneumonia Nov '15  . COPD (chronic obstructive pulmonary disease) ([place])   . Shortness of breath dyspnea   . Lung cancer [facility]) 1990    Patient states RUL Thoracotomy.  . Lung mass     new spiculated LUL lung mass    Patient Active Problem List   Diagnosis Date Noted  . Protein-calorie malnutrition, severe ([place]) 06/24/2014  .  Parapneumonic effusion 06/23/2014  . Failure to thrive in adult 06/23/2014  . Pneumonia 06/22/2014    Past Surgical History  Procedure Laterality Date  . Lung lobectomy Right     Right upper lobe  . Flexible bronchoscopy Bilateral 09/18/2014    Procedure: FLEXIBLE BRONCHOSCOPY;  Surgeon: Allyne Gee, MD;  Location: [facility] ORS;  Service: Pulmonary;  Laterality: Bilateral;    Current Outpatient Rx  Name  Route  Sig  Dispense  Refill  . albuterol (PROVENTIL HFA;VENTOLIN HFA) 108 (90 BASE) MCG/ACT inhaler   Inhalation   Inhale 1 puff into the lungs every 6 (six) hours as needed for wheezing or shortness of breath.         Marland Kitchen aspirin 81 MG chewable tablet   Oral   Chew 81 mg by mouth every morning.         . budesonide-formoterol (SYMBICORT) 160-4.5 MCG/ACT inhaler   Inhalation   Inhale 2 puffs into the lungs 2 (two) times daily.         Marland Kitchen tiotropium (SPIRIVA) 18 MCG inhalation capsule   Inhalation   Place 18 mcg into inhaler and inhale daily.         Marland Kitchen amoxicillin-clavulanate (AUGMENTIN) 400-57 MG/5ML suspension   Oral   Take 10.9 mLs (875 mg total) by mouth every 12 (twelve) hours. Patient not taking: Reported on 06/24/2015   150 mL   0   . guaiFENesin (ROBITUSSIN) 100 MG/5ML SOLN   Oral   Take 5 mLs (  100 mg total) by mouth every 4 (four) hours as needed for cough or to loosen phlegm. Patient not taking: Reported on 06/24/2015   118 mL   0   . potassium & sodium phosphates (PHOS-NAK) 280-160-250 MG PACK   Oral   Take 2 packets by mouth 2 (two) times daily with a meal. Patient not taking: Reported on 06/24/2015   6 packet   0   . tamsulosin (FLOMAX) 0.4 MG CAPS capsule   Oral   Take 1 capsule (0.4 mg total) by mouth daily. Patient not taking: Reported on 06/24/2015   30 capsule   0     Allergies Review of patient's allergies indicates no known allergies.  Family History  Problem Relation Age of Onset  . Coronary artery disease Father   . Diabetes Mother      Social History Social History  Substance Use Topics  . Smoking status: Current Every Day Smoker -- 0.50 packs/day for 55 years    Types: Cigarettes  . Smokeless tobacco: Never Used  . Alcohol Use: No    Review of Systems Constitutional: No fever/chills Eyes: No visual changes. ENT: No sore throat. Cardiovascular: Denies chest pain.  Left sided chest tightness for several days Respiratory: Mildly worse acute on chronic shortness of breath. Gastrointestinal: No abdominal pain.  No nausea, no vomiting.  No diarrhea.  No constipation. Genitourinary: Negative for dysuria. Musculoskeletal: Negative for back pain. Skin: Negative for rash. Neurological: Negative for headaches, focal weakness or numbness.  10-point ROS otherwise negative.  ____________________________________________   PHYSICAL EXAM:  VITAL SIGNS: ED Triage Vitals  Enc Vitals Group     BP 06/24/15 1128 116/52 mmHg     Pulse Rate 06/24/15 1128 87     Resp 06/24/15 1128 20     Temp 06/24/15 1128 97.5 F (36.4 C)     Temp Source 06/24/15 1128 Oral     SpO2 06/24/15 1128 96 %     Weight 06/24/15 1128 89 lb (40.37 kg)     Height 06/24/15 1128 '5\' 5"'$  (1.651 m)     Head Cir --      Peak Flow --      Pain Score --      Pain Loc --      Pain Edu? --      Excl. in [place]? --     Constitutional: Alert and oriented. No acute distress but cachectic with the appearance of chronic illness. Eyes: Conjunctivae are normal. PERRL. EOMI. Head: Atraumatic. Nose: No congestion/rhinnorhea. Mouth/Throat: Mucous membranes are moist.  Oropharynx non-erythematous. Neck: No stridor.  No meningeal signs.  No tracheal deviation. Cardiovascular: Normal rate, regular rhythm. Good peripheral circulation. Grossly normal heart sounds.   Respiratory: Normal respiratory effort.  No retractions. Lungs CTAB and breath sounds are present on both sides though slightly diminished on the left. Gastrointestinal: Soft and nontender. No  distention.  Musculoskeletal: No lower extremity tenderness nor edema. No gross deformities of extremities. Neurologic:  Normal speech and language. No gross focal neurologic deficits are appreciated.  Skin:  Skin is warm, dry and intact. No rash noted. Psychiatric: Mood and affect are normal. Speech and behavior are normal.  ____________________________________________   LABS (all labs ordered are listed, but only abnormal results are displayed)  Labs Reviewed  CBC WITH DIFFERENTIAL/PLATELET - Abnormal; Notable for the following:    WBC 16.1 (*)    MCV 78.8 (*)    MCH 25.0 (*)    MCHC 31.7 (*)  RDW 17.3 (*)    Platelets 488 (*)    Neutro Abs 12.4 (*)    All other components within normal limits  COMPREHENSIVE METABOLIC PANEL - Abnormal; Notable for the following:    Potassium 2.8 (*)    Chloride 92 (*)    CO2 33 (*)    Glucose, Bld 221 (*)    Total Protein 8.4 (*)    Albumin 3.1 (*)    ALT 16 (*)    All other components within normal limits  TROPONIN I  MAGNESIUM   ____________________________________________  EKG  ED ECG REPORT I, Paytyn Mesta, the attending physician, personally viewed and interpreted this ECG.  Date: 06/24/2015 EKG Time: 11:31 Rate: 86 Rhythm: normal sinus rhythm QRS Axis: Right axis deviation Intervals: normal ST/T Wave abnormalities: normal Conduction Disturbances: none Narrative Interpretation: unremarkable  ____________________________________________  RADIOLOGY Bennie Hind, Raife Lizer, personally viewed and evaluated these images (plain radiographs) as part of my medical decision making, as well as reviewing the written report by the radiologist.   Dg Chest 2 View  06/24/2015  CLINICAL DATA:  Left-sided pneumothorax on CT performed today. History of thoracotomy for lung cancer. Patient denies shortness of breath. EXAM: CHEST  2 VIEW COMPARISON:  CT today and 05/19/2015.  Radiographs 05/19/2015. FINDINGS: The heart size and mediastinal  contours are stable. The left apical pneumothorax is unchanged from the CT performed earlier today. There is no mediastinal shift. There are stable bibasilar airspace opacities with volume loss. No significant pleural effusion demonstrated. IMPRESSION: Stable small to moderate left-sided pneumothorax from CT performed earlier today. No evidence of tension component. Electronically Signed   By: Richardean Sale M.D.   On: 06/24/2015 12:30   Ct Chest W Contrast  06/24/2015  CLINICAL DATA:  Lung cancer, cough for 1 week, left upper lobe mass. EXAM: CT CHEST WITH CONTRAST TECHNIQUE: Multidetector CT imaging of the chest was performed during intravenous contrast administration. CONTRAST:  72m ISOVUE-300 IOPAMIDOL (ISOVUE-300) INJECTION 61% COMPARISON:  05/19/2015. FINDINGS: Mediastinum/Nodes: No pathologically enlarged mediastinal, hilar or axillary lymph nodes. Atherosclerotic calcification of the arterial vasculature, including coronary arteries. Heart size normal. Small amount of pericardial fluid appears slightly increased from 05/19/2015. Lungs/Pleura: Spiculated left upper lobe nodule measures 1.3 x 1.6 cm, previously 0.9 x 1.4 cm. Small to moderate left pneumothorax, new. Severe centrilobular and paraseptal emphysema. Peribronchovascular nodularity in the left lower lobe is new. Collapse/ consolidation in the left lower lobe persists. Spiculated masslike consolidation in the right lower lobe, slightly improved. Trace right pleural effusion with pleural thickening. Debris is seen dependently in the lower trachea and both mainstem bronchi. Upper abdomen: Numerous sub cm low-attenuation lesions are again seen in the liver and are too small to characterize. Biliary hamartomas can have this appearance. Right adrenal gland is unremarkable. There may be left adrenal thickening, stable. Visualized portions of the kidneys, spleen, pancreas, stomach and bowel are grossly unremarkable. Musculoskeletal: No worrisome lytic  or sclerotic lesions. IMPRESSION: 1. New small to moderate left pneumothorax, possibly due to bleb rupture. Critical Value/emergent results were called by telephone at the time of interpretation on 06/24/2015 at 10:41 am to EFarrell PA , who verbally acknowledged these results. 2. Spiculated left upper lobe nodule, stable to minimally larger than on 05/19/2015. 3. Spiculated masslike consolidation in the right lower lobe, slightly improved, suggesting resolving pneumonia. 4. Persistent left lower lobe collapse/consolidation. 5. Small amount of pericardial fluid appears slightly increased from 05/19/2015. 6. Ill-defined peribronchovascular nodularity in the left lower lobe is new  and most indicative of an infectious bronchiolitis. Electronically Signed   By: Lorin Picket M.D.   On: 06/24/2015 10:41    ____________________________________________   PROCEDURES  Procedure(s) performed: None  Critical Care performed: No ____________________________________________   INITIAL IMPRESSION / ASSESSMENT AND PLAN / ED COURSE  Pertinent labs & imaging results that were available during my care of the patient were reviewed by me and considered in my medical decision making (see chart for details).  The patient has a stable moderate size pneumothorax on the left side.  He has extensive lung disease as is evident on the CT scan.  Placed the patient on 4 L of oxygen by nasal cannula and instructed him to breathe through his nose and out through his mouth.  Given that he does not need an emergent thoracostomy, I will consult Dr. Genevive Bi with cardiothoracic surgery to request assistance with thoracostomy and admission for management of the pneumothorax.  Paged at approximately 11:50 AM.  ----------------------------------------- 12:32 PM on 06/24/2015 -----------------------------------------  Dr. Genevive Bi is in the emergency department and personally evaluating the patient.  He will place a thoracostomy and  admit for further management of the patient's pneumothorax.  I am ordering some potassium for his hypokalemia as well as adding on a magnesium.  Will start only with IV potassium given that he is about to have a procedure and I will keep him nothing by mouth until the admitting physician determines that he is ready for by mouth medication.  ----------------------------------------- 1:04 PM on 06/24/2015 -----------------------------------------  I assisted Dr. Genevive Bi with the placement of the pigtail catheter but he performed the procedure primarily.  The patient tolerated the procedure well with no complications. ____________________________________________  FINAL CLINICAL IMPRESSION(S) / ED DIAGNOSES  Final diagnoses:  Pneumothorax, left  Lung mass  Hypokalemia     MEDICATIONS GIVEN DURING THIS VISIT:  Medications  potassium chloride 10 mEq in 100 mL IVPB (not administered)  0.9 %  sodium chloride infusion (not administered)  lidocaine (PF) (XYLOCAINE) 1 % injection 5 mL (not administered)     NEW OUTPATIENT MEDICATIONS STARTED DURING THIS VISIT:  New Prescriptions   No medications on file      Note:  This document was prepared using Dragon voice recognition software and may include unintentional dictation errors.   Hinda Kehr, MD 06/24/15 1304

## 2015-06-24 NOTE — Progress Notes (Signed)
*   No surgery found *  7:12 PM  PATIENT:  [name]  70 y.o. male  PRE-OPERATIVE DIAGNOSIS:  Left spontaneous pneumothorax  POST-OPERATIVE DIAGNOSIS:  same  PROCEDURE:  Insertion of left chest tube  SURGEON:  Maksym Pfiffner  ASSISTANTS: Danni Speary PAS  ANESTHESIA: local 1% lidocaine  INDICATIONS FOR PROCEDURE left pneumothorax  DICTATION: The patient was found to have a 10 - 20 % left sided pneumothorax on CT scan of chest.  Given his severe COPD a left chest tube was indicated.  Risks and benefits discussed and consent given.  Using sterile technique and local anesthesia, the left chest was entered at the 2nd intercostal space in the mid clavicular line.  Using the Seldinger technique, a 14 French pigtail catheter was inserted.  The tube was secured to the skin with silk sutures.  There was an initial rush of air but none once the chest tube was placed to 20 cm of water suction.  Repeat CXray confirmed the tube to be in good position with no residual pneumothorax.   [name], MD

## 2015-06-24 NOTE — Consult Note (Signed)
[facility] NAME: Rick Clark    MR#:  [id number]  DATE OF BIRTH:  01/03/1946  DATE OF ADMISSION:  06/24/2015  PRIMARY CARE PHYSICIAN: [name], MD   CONSULT REQUESTING/REFERRING PHYSICIAN: Dr. Genevive Bi  REASON FOR CONSULT: COPD, hypokalemia  CHIEF COMPLAINT:   Chief Complaint  Patient presents with  . Shortness of Breath    HISTORY OF PRESENT ILLNESS:  Rick Clark  is a 70 y.o. male with a known history of Lung cancer, COPD, malnutrition, tobacco use presents to the emergency room as he was found to have left-sided pneumothorax on routine CT scan of the chest. Patient follows with Dr. Humphrey Rolls of pulmonary for right upper lobe nodule. He had a CT scan of the chest done today for routine follow-up which showed left pneumothorax and was called into the emergency room. Patient has noticed some left-sided chest tightness over the last 3-4 days. Patient presently has a chest tube placed by Dr. Faith Rogue thoracic surgery. He is needing 2 L oxygen. Patient has continued to smoke. He has lost 30 pounds in the last 6 months. He does mention that he has better appetite since his pneumonia was treated 1 month back in the hospital. Presently complains of chest pain at the site of the chest tube. Patient has had chronic leukocytosis. Afebrile.   PAST MEDICAL HISTORY:   Past Medical History  Diagnosis Date  . Pneumonia Nov '15  . COPD (chronic obstructive pulmonary disease) ([place])   . Shortness of breath dyspnea   . Lung cancer [facility]) 1990    Patient states RUL Thoracotomy.  . Lung mass     new spiculated LUL lung mass    PAST SURGICAL HISTOIRY:   Past Surgical History  Procedure Laterality Date  . Lung lobectomy Right     Right upper lobe  . Flexible bronchoscopy Bilateral 09/18/2014    Procedure: FLEXIBLE BRONCHOSCOPY;  Surgeon: Allyne Gee, MD;  Location: [facility] ORS;  Service: Pulmonary;  Laterality: Bilateral;     SOCIAL HISTORY:   Social History  Substance Use Topics  . Smoking status: Current Every Day Smoker -- 0.50 packs/day for 55 years    Types: Cigarettes  . Smokeless tobacco: Never Used  . Alcohol Use: No    FAMILY HISTORY:   Family History  Problem Relation Age of Onset  . Coronary artery disease Father   . Diabetes Mother     DRUG ALLERGIES:  No Known Allergies  REVIEW OF SYSTEMS:   ROS  CONSTITUTIONAL: No fever, fatigue or weakness. Weight loss  EYES: No blurred or double vision.  EARS, NOSE, AND THROAT: No tinnitus or ear pain.  RESPIRATORY: No wheezing or hemoptysis. Chronic shortness of breath and cough  CARDIOVASCULAR: No  orthopnea, edema. Chest pain at the site of chest 2  GASTROINTESTINAL: No nausea, vomiting, diarrhea or abdominal pain.  GENITOURINARY: No dysuria, hematuria.  ENDOCRINE: No polyuria, nocturia,  HEMATOLOGY: No anemia, easy bruising or bleeding SKIN: No rash or lesion. MUSCULOSKELETAL: No joint pain or arthritis.   NEUROLOGIC: No tingling, numbness, weakness.  PSYCHIATRY: No anxiety or depression.   MEDICATIONS AT HOME:   Prior to Admission medications   Medication Sig Start Date End Date Taking? Authorizing Provider  albuterol (PROVENTIL HFA;VENTOLIN HFA) 108 (90 BASE) MCG/ACT inhaler Inhale 1 puff into the lungs every 6 (six) hours as needed for wheezing or shortness of breath.   Yes Historical Provider, MD  aspirin 81 MG  chewable tablet Chew 81 mg by mouth every morning.   Yes Historical Provider, MD  budesonide-formoterol (SYMBICORT) 160-4.5 MCG/ACT inhaler Inhale 2 puffs into the lungs 2 (two) times daily.   Yes Historical Provider, MD  tiotropium (SPIRIVA) 18 MCG inhalation capsule Place 18 mcg into inhaler and inhale daily.   Yes Historical Provider, MD  amoxicillin-clavulanate (AUGMENTIN) 400-57 MG/5ML suspension Take 10.9 mLs (875 mg total) by mouth every 12 (twelve) hours. Patient not taking: Reported on 06/24/2015 05/22/15   Demetrios Loll, MD  guaiFENesin (ROBITUSSIN) 100 MG/5ML SOLN Take 5 mLs (100 mg total) by mouth every 4 (four) hours as needed for cough or to loosen phlegm. Patient not taking: Reported on 06/24/2015 05/22/15   Demetrios Loll, MD  potassium & sodium phosphates (PHOS-NAK) 280-160-250 MG PACK Take 2 packets by mouth 2 (two) times daily with a meal. Patient not taking: Reported on 06/24/2015 05/22/15   Demetrios Loll, MD  tamsulosin (FLOMAX) 0.4 MG CAPS capsule Take 1 capsule (0.4 mg total) by mouth daily. Patient not taking: Reported on 06/24/2015 05/22/15   Demetrios Loll, MD      VITAL SIGNS:  Blood pressure 129/56, pulse 84, temperature 97.5 F (36.4 C), temperature source Oral, resp. rate 25, height '5\' 5"'$  (1.651 m), weight 40.37 kg (89 lb), SpO2 98 %.  PHYSICAL EXAMINATION:  GENERAL:  70 y.o.-year-old patient lying in the bed with no acute distress.  EYES: Pupils equal, round, reactive to light and accommodation. No scleral icterus. Extraocular muscles intact.  HEENT: Head atraumatic, normocephalic. Oropharynx and nasopharynx clear.  NECK:  Supple, no jugular venous distention. No thyroid enlargement, no tenderness.  LUNGS: Normal breath sounds bilaterally, no wheezing, rales,rhonchi or crepitation. No use of accessory muscles of respiration. Left-sided decreased breath sounds. Left chest tube in place.  CARDIOVASCULAR: S1, S2 normal. No murmurs, rubs, or gallops.  ABDOMEN: Soft, nontender, nondistended. Bowel sounds present. No organomegaly or mass.  EXTREMITIES: No pedal edema, cyanosis, or clubbing. Diffuse muscle wasting  NEUROLOGIC: Cranial nerves II through XII are intact. Muscle strength 5/5 in all extremities. Sensation intact. Gait not checked.  PSYCHIATRIC: The patient is alert and oriented x 3.  SKIN: No obvious rash, lesion, or ulcer.   LABORATORY PANEL:   CBC  Recent Labs Lab 06/24/15 1148  WBC 16.1*  HGB 13.2  HCT 41.6  PLT 488*    ------------------------------------------------------------------------------------------------------------------  Chemistries   Recent Labs Lab 06/24/15 1134 06/24/15 1148  NA  --  136  K  --  2.8*  CL  --  92*  CO2  --  33*  GLUCOSE  --  221*  BUN  --  8  CREATININE  --  0.61  CALCIUM  --  8.9  MG 1.8  --   AST  --  24  ALT  --  16*  ALKPHOS  --  107  BILITOT  --  0.5   ------------------------------------------------------------------------------------------------------------------  Cardiac Enzymes  Recent Labs Lab 06/24/15 1148  TROPONINI <0.03   ------------------------------------------------------------------------------------------------------------------  RADIOLOGY:  Dg Chest 2 View  06/24/2015  CLINICAL DATA:  Left-sided pneumothorax on CT performed today. History of thoracotomy for lung cancer. Patient denies shortness of breath. EXAM: CHEST  2 VIEW COMPARISON:  CT today and 05/19/2015.  Radiographs 05/19/2015. FINDINGS: The heart size and mediastinal contours are stable. The left apical pneumothorax is unchanged from the CT performed earlier today. There is no mediastinal shift. There are stable bibasilar airspace opacities with volume loss. No significant pleural effusion demonstrated. IMPRESSION: Stable  small to moderate left-sided pneumothorax from CT performed earlier today. No evidence of tension component. Electronically Signed   By: Richardean Sale M.D.   On: 06/24/2015 12:30   Ct Chest W Contrast  06/24/2015  CLINICAL DATA:  Lung cancer, cough for 1 week, left upper lobe mass. EXAM: CT CHEST WITH CONTRAST TECHNIQUE: Multidetector CT imaging of the chest was performed during intravenous contrast administration. CONTRAST:  70m ISOVUE-300 IOPAMIDOL (ISOVUE-300) INJECTION 61% COMPARISON:  05/19/2015. FINDINGS: Mediastinum/Nodes: No pathologically enlarged mediastinal, hilar or axillary lymph nodes. Atherosclerotic calcification of the arterial vasculature,  including coronary arteries. Heart size normal. Small amount of pericardial fluid appears slightly increased from 05/19/2015. Lungs/Pleura: Spiculated left upper lobe nodule measures 1.3 x 1.6 cm, previously 0.9 x 1.4 cm. Small to moderate left pneumothorax, new. Severe centrilobular and paraseptal emphysema. Peribronchovascular nodularity in the left lower lobe is new. Collapse/ consolidation in the left lower lobe persists. Spiculated masslike consolidation in the right lower lobe, slightly improved. Trace right pleural effusion with pleural thickening. Debris is seen dependently in the lower trachea and both mainstem bronchi. Upper abdomen: Numerous sub cm low-attenuation lesions are again seen in the liver and are too small to characterize. Biliary hamartomas can have this appearance. Right adrenal gland is unremarkable. There may be left adrenal thickening, stable. Visualized portions of the kidneys, spleen, pancreas, stomach and bowel are grossly unremarkable. Musculoskeletal: No worrisome lytic or sclerotic lesions. IMPRESSION: 1. New small to moderate left pneumothorax, possibly due to bleb rupture. Critical Value/emergent results were called by telephone at the time of interpretation on 06/24/2015 at 10:41 am to EValmeyer PA , who verbally acknowledged these results. 2. Spiculated left upper lobe nodule, stable to minimally larger than on 05/19/2015. 3. Spiculated masslike consolidation in the right lower lobe, slightly improved, suggesting resolving pneumonia. 4. Persistent left lower lobe collapse/consolidation. 5. Small amount of pericardial fluid appears slightly increased from 05/19/2015. 6. Ill-defined peribronchovascular nodularity in the left lower lobe is new and most indicative of an infectious bronchiolitis. Electronically Signed   By: MLorin PicketM.D.   On: 06/24/2015 10:41   Dg Chest Portable 1 View  06/24/2015  CLINICAL DATA:  Left-sided chest tube placement EXAM: PORTABLE CHEST 1  VIEW COMPARISON:  Chest radiograph and chest CT obtained earlier in the day FINDINGS: Chest tube has been placed on the left with resolution of left-sided pneumothorax. Currently no pneumothorax is seen on either side. There is bullous disease in the left apex, stable. There is postoperative change on the right with volume loss and considerable scarring, stable. There is stable consolidation in the right base. The mass lesion in the left upper lobe appears to slightly medial to the chest tube and is not well demonstrated. There is airspace consolidation in the medial left base, stable. Heart size is normal. The pulmonary vascularity is stable with postoperative change on the right. Pulmonary vascularity is is normal on the left. No adenopathy is seen. No bone lesions are evident. IMPRESSION: Resolution of pneumothorax on the left with chest tube placement. Areas of consolidation in both lower lobes. Scarring on the right is stable with postoperative change. Nodular lesion left upper lobe is not well seen but is present medial to the chest tube. No new opacity. No change in cardiac silhouette. Electronically Signed   By: WLowella GripIII M.D.   On: 06/24/2015 13:33    EKG:   Orders placed or performed during the hospital encounter of 05/19/15  . EKG 12-Lead  .  EKG 12-Lead  . EKG    IMPRESSION AND PLAN:   * Left pneumothorax with acute hypoxic respiratory failure Status post chest tube placement by Dr. Genevive Bi. Repeat chest x-ray tomorrow.  * Hypokalemia Replace through IV and orally. Check magnesium level. Repeat potassium levels in the morning.  * COPD Continue home inhalers and nebulizer when necessary.  * Severe protein calorie malnutrition. Patient's appetite is improved since his pneumonia was treated recently. We will add ensure to his diet.  * DVT prophylaxis with Lovenox.   All the records are reviewed and case discussed with Consulting provider Dr. Genevive Bi. Management plans  discussed with the patient, family and they are in agreement.  CODE STATUS: FULL CODE  TOTAL TIME TAKING CARE OF THIS PATIENT: 40 minutes.   [name] R M.D on 06/24/2015 at 2:54 PM  Between 7am to 6pm - Pager - [phone]  After 6pm go to [email or web address] - password [facility]  Office  [phone]  ME:[id number] care Physician: [name], MD  Note: This dictation was prepared with Dragon dictation along with smaller phrase technology. Any transcriptional errors that result from this process are unintentional.

## 2015-06-24 NOTE — ED Notes (Signed)
Critical potassium, MD made aware

## 2015-06-24 NOTE — ED Notes (Signed)
Pt went for a routine chest CT this morning and was called back with a collapsed lower left lobe.. Pt denies any increased SOB, states he just got home from a [place] long vacation at the beach.. States he was surprised when he got the phone call.

## 2015-06-25 ENCOUNTER — Inpatient Hospital Stay: Payer: PPO

## 2015-06-25 ENCOUNTER — Inpatient Hospital Stay: Payer: PPO | Admitting: Oncology

## 2015-06-25 DIAGNOSIS — R918 Other nonspecific abnormal finding of lung field: Secondary | ICD-10-CM | POA: Diagnosis not present

## 2015-06-25 DIAGNOSIS — G939 Disorder of brain, unspecified: Secondary | ICD-10-CM | POA: Diagnosis not present

## 2015-06-25 DIAGNOSIS — R634 Abnormal weight loss: Secondary | ICD-10-CM | POA: Diagnosis not present

## 2015-06-25 DIAGNOSIS — R627 Adult failure to thrive: Secondary | ICD-10-CM | POA: Diagnosis not present

## 2015-06-25 DIAGNOSIS — F17210 Nicotine dependence, cigarettes, uncomplicated: Secondary | ICD-10-CM | POA: Diagnosis not present

## 2015-06-25 DIAGNOSIS — R0602 Shortness of breath: Secondary | ICD-10-CM

## 2015-06-25 DIAGNOSIS — Z79899 Other long term (current) drug therapy: Secondary | ICD-10-CM

## 2015-06-25 DIAGNOSIS — C3412 Malignant neoplasm of upper lobe, left bronchus or lung: Secondary | ICD-10-CM | POA: Diagnosis not present

## 2015-06-25 DIAGNOSIS — J939 Pneumothorax, unspecified: Secondary | ICD-10-CM

## 2015-06-25 DIAGNOSIS — J449 Chronic obstructive pulmonary disease, unspecified: Secondary | ICD-10-CM

## 2015-06-25 DIAGNOSIS — R5383 Other fatigue: Secondary | ICD-10-CM

## 2015-06-25 DIAGNOSIS — Z8701 Personal history of pneumonia (recurrent): Secondary | ICD-10-CM

## 2015-06-25 DIAGNOSIS — R531 Weakness: Secondary | ICD-10-CM

## 2015-06-25 DIAGNOSIS — Z85118 Personal history of other malignant neoplasm of bronchus and lung: Secondary | ICD-10-CM

## 2015-06-25 DIAGNOSIS — Z7982 Long term (current) use of aspirin: Secondary | ICD-10-CM

## 2015-06-25 DIAGNOSIS — R079 Chest pain, unspecified: Secondary | ICD-10-CM

## 2015-06-25 DIAGNOSIS — C3490 Malignant neoplasm of unspecified part of unspecified bronchus or lung: Secondary | ICD-10-CM | POA: Diagnosis not present

## 2015-06-25 LAB — COMPREHENSIVE METABOLIC PANEL
ALT: 12 U/L — AB (ref 17–63)
AST: 16 U/L (ref 15–41)
Albumin: 2.4 g/dL — ABNORMAL LOW (ref 3.5–5.0)
Alkaline Phosphatase: 81 U/L (ref 38–126)
Anion gap: 5 (ref 5–15)
BILIRUBIN TOTAL: 0.3 mg/dL (ref 0.3–1.2)
BUN: 7 mg/dL (ref 6–20)
CHLORIDE: 100 mmol/L — AB (ref 101–111)
CO2: 30 mmol/L (ref 22–32)
CREATININE: 0.49 mg/dL — AB (ref 0.61–1.24)
Calcium: 8 mg/dL — ABNORMAL LOW (ref 8.9–10.3)
GLUCOSE: 100 mg/dL — AB (ref 65–99)
POTASSIUM: 3.8 mmol/L (ref 3.5–5.1)
Sodium: 135 mmol/L (ref 135–145)
Total Protein: 6.5 g/dL (ref 6.5–8.1)

## 2015-06-25 LAB — CBC
HEMATOCRIT: 34.8 % — AB (ref 40.0–52.0)
Hemoglobin: 11.1 g/dL — ABNORMAL LOW (ref 13.0–18.0)
MCH: 25 pg — AB (ref 26.0–34.0)
MCHC: 32 g/dL (ref 32.0–36.0)
MCV: 78.1 fL — AB (ref 80.0–100.0)
PLATELETS: 379 10*3/uL (ref 150–440)
RBC: 4.45 MIL/uL (ref 4.40–5.90)
RDW: 16.6 % — AB (ref 11.5–14.5)
WBC: 19.6 10*3/uL — ABNORMAL HIGH (ref 3.8–10.6)

## 2015-06-25 MED ORDER — SODIUM CHLORIDE 0.9 % IV BOLUS (SEPSIS)
250.0000 mL | Freq: Once | INTRAVENOUS | Status: AC
  Administered 2015-06-25: 250 mL via INTRAVENOUS

## 2015-06-25 MED ORDER — SODIUM CHLORIDE 0.9 % IV SOLN
INTRAVENOUS | Status: DC
Start: 2015-06-26 — End: 2015-06-28
  Administered 2015-06-26: 09:00:00 via INTRAVENOUS

## 2015-06-25 MED ORDER — FUROSEMIDE 10 MG/ML IJ SOLN
40.0000 mg | Freq: Once | INTRAMUSCULAR | Status: AC
  Administered 2015-06-25: 40 mg via INTRAVENOUS
  Filled 2015-06-25: qty 4

## 2015-06-25 NOTE — Progress Notes (Signed)
Rick Clark Inpatient Post-Op Note  Patient ID: Rick Clark, male   DOB: 1945/03/21, 70 y.o.   MRN: [id number]  HISTORY: He did well overnight. He does not complain of any shortness of breath. He's had no fever. He continues to have a cough which is nonproductive.   Filed Vitals:   06/25/15 0126 06/25/15 0523  BP: 118/55 109/75  Pulse: 88 77  Temp: 98.6 F (37 C) 98.7 F (37.1 C)  Resp: 19 18     EXAM: Resp: Lungs are clear bilaterally.  No respiratory distress, normal effort. Heart:  Regular without murmurs Abd:  Abdomen is soft, non distended and non tender. No masses are palpable.  There is no rebound and no guarding.  Neurological: Alert and oriented to person, place, and time. Coordination normal.  Skin: Skin is warm and dry. No rash noted. No diaphoretic. No erythema. No pallor.  Psychiatric: Normal mood and affect. Normal behavior. Judgment and thought content normal.   Independent review of CXRay from this morning shows no pneumothorax  ASSESSMENT: Spontaneous pneumothorax left side.   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 There has been reluctance to biopsy this area because of his severe COPD. Patient now has a chest tube in place and therefore we will ask oncology and radiation therapy to render an opinion as to whether or not a biopsy is indicated and subsequent therapy. The patient is not a candidate for any surgical intervention.  I will place his chest tube waterseal.    [name], MD

## 2015-06-25 NOTE — Consult Note (Signed)
[place]  [phone] [phone]  ID: [name] OB: Dec 29, 1945  MR#: [id number]  HBZ#:[id number]  Patient Care Team: [name], MD as PCP - General (Internal Medicine)  CHIEF COMPLAINT:  Chief Complaint  Patient presents with  . Shortness of Breath    INTERVAL HISTORY: Patient is a 70 year old male with a known history of pulmonary nodules highly suspicious for underlying malignancy. He had a routine scheduled surveillance CT scan and was noted to have a large pneumothorax. Patient was subsequently admitted to the hospital for chest tube placement. Currently, patient feels well and is as baseline. He states he does not have any chest pain or hemoptysis, but does have chronic shortness of breath and cough that are unchanged. He has mild weakness and fatigue. He has no neurologic complaints. He has good appetite and reports weight loss, but is unclear how much. He has no nausea, vomiting, constipation, or diarrhea. He has no urinary complaints. Patient otherwise feels well and offers no further specific complaints.  REVIEW OF SYSTEMS:   Review of Systems  Constitutional: Positive for weight loss and malaise/fatigue. Negative for fever.  Respiratory: Positive for cough and shortness of breath.   Cardiovascular: Positive for chest pain.  Gastrointestinal: Negative.   Genitourinary: Negative.   Musculoskeletal: Negative.   Neurological: Positive for weakness.  Psychiatric/Behavioral: Negative.     As per HPI. Otherwise, a complete review of systems is negatve.  PAST MEDICAL HISTORY: Past Medical History  Diagnosis Date  . Pneumonia Nov '15  . COPD (chronic obstructive pulmonary disease) ([place])   . Shortness of breath dyspnea   . Lung cancer [facility]) 1990    Patient states RUL Thoracotomy.  . Lung mass     new spiculated LUL lung mass    PAST SURGICAL HISTORY: Past Surgical History  Procedure Laterality Date  . Lung lobectomy  Right     Right upper lobe  . Flexible bronchoscopy Bilateral 09/18/2014    Procedure: FLEXIBLE BRONCHOSCOPY;  Surgeon: Allyne Gee, MD;  Location: [facility] ORS;  Service: Pulmonary;  Laterality: Bilateral;    FAMILY HISTORY Family History  Problem Relation Age of Onset  . Coronary artery disease Father   . Diabetes Mother        ADVANCED DIRECTIVES:    HEALTH MAINTENANCE: Social History  Substance Use Topics  . Smoking status: Current Every Day Smoker -- 0.50 packs/day for 55 years    Types: Cigarettes  . Smokeless tobacco: Never Used  . Alcohol Use: No     Colonoscopy:  PAP:  Bone density:  Lipid panel:  No Known Allergies  Current Facility-Administered Medications  Medication Dose Route Frequency Provider Last Rate Last Dose  . [START ON 06/26/2015] 0.9 %  sodium chloride infusion   Intravenous Continuous [name], MD      . acetaminophen (TYLENOL) tablet 650 mg  650 mg Oral Q6H PRN [name], MD      . albuterol (PROVENTIL) (2.5 MG/3ML) 0.083% nebulizer solution 2.5 mg  2.5 mg Nebulization Q4H while awake [name], MD   2.5 mg at 06/25/15 1525  . albuterol (PROVENTIL) (2.5 MG/3ML) 0.083% nebulizer solution 3 mL  3 mL Inhalation Q6H PRN [name], MD   3 mL at 06/25/15 0620  . aspirin chewable tablet 81 mg  81 mg Oral BH-q7a [name], MD   81 mg at 06/25/15 0600  . bisacodyl (DULCOLAX) EC tablet 10 mg  10 mg Oral Daily [name], MD  10 mg at 06/25/15 0900  . feeding supplement (ENSURE ENLIVE) (ENSURE ENLIVE) liquid 237 mL  237 mL Oral BID BM [name], MD   237 mL at 06/25/15 1324  . guaiFENesin (ROBITUSSIN) 100 MG/5ML solution 100 mg  5 mL Oral Q4H PRN [name], MD      . mometasone-formoterol (DULERA) 200-5 MCG/ACT inhaler 2 puff  2 puff Inhalation BID [name], MD   2 puff at 06/25/15 0859  . ondansetron (ZOFRAN) injection 4 mg  4 mg Intravenous Q6H PRN [name], MD      . oxyCODONE (ROXICODONE) 5 MG/5ML solution 5 mg  5 mg Oral  Q4H PRN [name], MD   5 mg at 06/25/15 1656  . potassium & sodium phosphates (PHOS-NAK) 280-160-250 MG packet 2 packet  2 packet Oral BID WC [name], MD   2 packet at 06/25/15 1540  . tamsulosin (FLOMAX) capsule 0.4 mg  0.4 mg Oral Daily [name], MD   0.4 mg at 06/24/15 1515  . tiotropium (SPIRIVA) inhalation capsule 18 mcg  18 mcg Inhalation Daily [name], MD   18 mcg at 06/25/15 0859    OBJECTIVE: Filed Vitals:   06/25/15 1435 06/25/15 1659  BP: 88/47 104/47  Pulse:    Temp:    Resp:       Body mass index is 14.81 kg/(m^2).    ECOG FS:1 - Symptomatic but completely ambulatory  General: Thin, no acute distress. Eyes: Pink conjunctiva, anicteric sclera. HEENT: Normocephalic, moist mucous membranes, clear oropharnyx. Lungs: Chest tube in left lung. Heart: Regular rate and rhythm. No rubs, murmurs, or gallops. Abdomen: Soft, nontender, nondistended. No organomegaly noted, normoactive bowel sounds. Musculoskeletal: No edema, cyanosis, or clubbing. Neuro: Alert, answering all questions appropriately. Cranial nerves grossly intact. Skin: No rashes or petechiae noted. Psych: Normal affect.   LAB RESULTS:  Lab Results  Component Value Date   NA 135 06/25/2015   K 3.8 06/25/2015   CL 100* 06/25/2015   CO2 30 06/25/2015   GLUCOSE 100* 06/25/2015   BUN 7 06/25/2015   CREATININE 0.49* 06/25/2015   CALCIUM 8.0* 06/25/2015   PROT 6.5 06/25/2015   ALBUMIN 2.4* 06/25/2015   AST 16 06/25/2015   ALT 12* 06/25/2015   ALKPHOS 81 06/25/2015   BILITOT 0.3 06/25/2015   GFRNONAA >60 06/25/2015   GFRAA >60 06/25/2015    Lab Results  Component Value Date   WBC 19.6* 06/25/2015   NEUTROABS 12.4* 06/24/2015   HGB 11.1* 06/25/2015   HCT 34.8* 06/25/2015   MCV 78.1* 06/25/2015   PLT 379 06/25/2015     STUDIES: Dg Chest 2 View  06/24/2015  CLINICAL DATA:  Left-sided pneumothorax on CT performed today. History of thoracotomy for lung cancer. Patient denies  shortness of breath. EXAM: CHEST  2 VIEW COMPARISON:  CT today and 05/19/2015.  Radiographs 05/19/2015. FINDINGS: The heart size and mediastinal contours are stable. The left apical pneumothorax is unchanged from the CT performed earlier today. There is no mediastinal shift. There are stable bibasilar airspace opacities with volume loss. No significant pleural effusion demonstrated. IMPRESSION: Stable small to moderate left-sided pneumothorax from CT performed earlier today. No evidence of tension component. Electronically Signed   By: Richardean Sale M.D.   On: 06/24/2015 12:30   Ct Chest W Contrast  06/24/2015  CLINICAL DATA:  Lung cancer, cough for 1 week, left upper lobe mass. EXAM: CT CHEST WITH CONTRAST TECHNIQUE: Multidetector CT imaging of the chest was performed during intravenous  contrast administration. CONTRAST:  49m ISOVUE-300 IOPAMIDOL (ISOVUE-300) INJECTION 61% COMPARISON:  05/19/2015. FINDINGS: Mediastinum/Nodes: No pathologically enlarged mediastinal, hilar or axillary lymph nodes. Atherosclerotic calcification of the arterial vasculature, including coronary arteries. Heart size normal. Small amount of pericardial fluid appears slightly increased from 05/19/2015. Lungs/Pleura: Spiculated left upper lobe nodule measures 1.3 x 1.6 cm, previously 0.9 x 1.4 cm. Small to moderate left pneumothorax, new. Severe centrilobular and paraseptal emphysema. Peribronchovascular nodularity in the left lower lobe is new. Collapse/ consolidation in the left lower lobe persists. Spiculated masslike consolidation in the right lower lobe, slightly improved. Trace right pleural effusion with pleural thickening. Debris is seen dependently in the lower trachea and both mainstem bronchi. Upper abdomen: Numerous sub cm low-attenuation lesions are again seen in the liver and are too small to characterize. Biliary hamartomas can have this appearance. Right adrenal gland is unremarkable. There may be left adrenal  thickening, stable. Visualized portions of the kidneys, spleen, pancreas, stomach and bowel are grossly unremarkable. Musculoskeletal: No worrisome lytic or sclerotic lesions. IMPRESSION: 1. New small to moderate left pneumothorax, possibly due to bleb rupture. Critical Value/emergent results were called by telephone at the time of interpretation on 06/24/2015 at 10:41 am to [place] PA , who verbally acknowledged these results. 2. Spiculated left upper lobe nodule, stable to minimally larger than on 05/19/2015. 3. Spiculated masslike consolidation in the right lower lobe, slightly improved, suggesting resolving pneumonia. 4. Persistent left lower lobe collapse/consolidation. 5. Small amount of pericardial fluid appears slightly increased from 05/19/2015. 6. Ill-defined peribronchovascular nodularity in the left lower lobe is new and most indicative of an infectious bronchiolitis. Electronically Signed   By: MLorin PicketM.D.   On: 06/24/2015 10:41   Dg Chest Port 1 View  06/25/2015  CLINICAL DATA:  Pneumothorax.  Lung cancer.  Chest tube. EXAM: PORTABLE CHEST 1 VIEW COMPARISON:  06/24/2015 FINDINGS: Pigtail catheter in the left chest cavity is unchanged. No pneumothorax. Left apical blebs noted. Left lower lobe consolidation slightly progressive. Extensive right lower lobe airspace disease also progressive since the prior study. No pleural effusion Severe COPD with apical scarring bilaterally. IMPRESSION: Left chest tube remains in place.  No pneumothorax on the left Progression of bilateral airspace disease which may represent collapse and/or pneumonia. Electronically Signed   By: CFranchot GalloM.D.   On: 06/25/2015 07:39   Dg Chest Portable 1 View  06/24/2015  CLINICAL DATA:  Left-sided chest tube placement EXAM: PORTABLE CHEST 1 VIEW COMPARISON:  Chest radiograph and chest CT obtained earlier in the day FINDINGS: Chest tube has been placed on the left with resolution of left-sided pneumothorax.  Currently no pneumothorax is seen on either side. There is bullous disease in the left apex, stable. There is postoperative change on the right with volume loss and considerable scarring, stable. There is stable consolidation in the right base. The mass lesion in the left upper lobe appears to slightly medial to the chest tube and is not well demonstrated. There is airspace consolidation in the medial left base, stable. Heart size is normal. The pulmonary vascularity is stable with postoperative change on the right. Pulmonary vascularity is is normal on the left. No adenopathy is seen. No bone lesions are evident. IMPRESSION: Resolution of pneumothorax on the left with chest tube placement. Areas of consolidation in both lower lobes. Scarring on the right is stable with postoperative change. Nodular lesion left upper lobe is not well seen but is present medial to the chest tube. No new opacity.  No change in cardiac silhouette. Electronically Signed   By: Lowella Grip III M.D.   On: 06/24/2015 13:33    ASSESSMENT: Pneumothorax, left upper lobe lesion suspicious for malignancy.  PLAN:    1.  Left upper lobe lesion: Highly suspicious for underlying malignancy. Previously, biopsy was not attempted given patient's poor lung function. Now that chest tube is in place for his spontaneous pneumothorax, can proceed with CT-guided biopsy. If confirmed malignancy, patient likely would not be able to tolerate chemotherapy but radiation therapy may be an option. Will comment further once biopsy is completed and results have been reported. 2. Right lower lobe lesion: Mildly improved. Still suspicious for malignancy, but will continue to monitor with CT scans. 3. Pneumothorax: Appears to be spontaneous: Chest tube in place. Appreciate thoracic surgery input.  Appreciate consult, will follow.    Lloyd Huger, MD   06/25/2015 6:18 PM

## 2015-06-25 NOTE — Progress Notes (Signed)
[place] [facility] [place] NAME: Rick Clark    MR#:  [id number]  DATE OF BIRTH:  December 10, 1945  SUBJECTIVE:  CHIEF COMPLAINT:   Chief Complaint  Patient presents with  . Shortness of Breath   Pain at the site of chest tube. No shortness of breath. Has chronic dry cough. No shortness of breath. Afebrile.  REVIEW OF SYSTEMS:    Review of Systems  Constitutional: Positive for weight loss. Negative for fever and chills.  HENT: Negative for sore throat.   Eyes: Negative for blurred vision, double vision and pain.  Respiratory: Positive for cough. Negative for hemoptysis, shortness of breath and wheezing.   Cardiovascular: Positive for chest pain. Negative for palpitations, orthopnea and leg swelling.  Gastrointestinal: Negative for heartburn, nausea, vomiting, abdominal pain, diarrhea and constipation.  Genitourinary: Negative for dysuria and hematuria.  Musculoskeletal: Negative for back pain and joint pain.  Skin: Negative for rash.  Neurological: Negative for sensory change, speech change, focal weakness and headaches.  Endo/Heme/Allergies: Does not bruise/bleed easily.  Psychiatric/Behavioral: Negative for depression. The patient is not nervous/anxious.     DRUG ALLERGIES:  No Known Allergies  VITALS:  Blood pressure 109/75, pulse 77, temperature 98.7 F (37.1 C), temperature source Oral, resp. rate 18, height '5\' 5"'$  (1.651 m), weight 40.37 kg (89 lb), SpO2 96 %.  PHYSICAL EXAMINATION:   Physical Exam  GENERAL:  70 y.o.-year-old patient lying in the bed with no acute distress.  EYES: Pupils equal, round, reactive to light and accommodation. No scleral icterus. Extraocular muscles intact.  HEENT: Head atraumatic, normocephalic. Oropharynx and nasopharynx clear.  NECK:  Supple, no jugular venous distention. No thyroid enlargement, no tenderness.  LUNGS: Normal breath sounds bilaterally, no wheezing, rales, rhonchi. No use of  accessory muscles of respiration. Left chest tube in place CARDIOVASCULAR: S1, S2 normal. No murmurs, rubs, or gallops.  ABDOMEN: Soft, nontender, nondistended. Bowel sounds present. No organomegaly or mass.  EXTREMITIES: No cyanosis, clubbing or edema b/l.   Muscle wasting NEUROLOGIC: Cranial nerves II through XII are intact. No focal Motor or sensory deficits b/l.   PSYCHIATRIC: The patient is alert and oriented x 3.  SKIN: No obvious rash, lesion, or ulcer.   LABORATORY PANEL:   CBC  Recent Labs Lab 06/25/15 0419  WBC 19.6*  HGB 11.1*  HCT 34.8*  PLT 379   ------------------------------------------------------------------------------------------------------------------ Chemistries   Recent Labs Lab 06/24/15 1134  06/25/15 0419  NA  --   < > 135  K  --   < > 3.8  CL  --   < > 100*  CO2  --   < > 30  GLUCOSE  --   < > 100*  BUN  --   < > 7  CREATININE  --   < > 0.49*  CALCIUM  --   < > 8.0*  MG 1.8  --   --   AST  --   < > 16  ALT  --   < > 12*  ALKPHOS  --   < > 81  BILITOT  --   < > 0.3  < > = values in this interval not displayed. ------------------------------------------------------------------------------------------------------------------  Cardiac Enzymes  Recent Labs Lab 06/24/15 1148  TROPONINI <0.03   ------------------------------------------------------------------------------------------------------------------  RADIOLOGY:  Dg Chest 2 View  06/24/2015  CLINICAL DATA:  Left-sided pneumothorax on CT performed today. History of thoracotomy for lung cancer. Patient denies shortness of breath. EXAM: CHEST  2  VIEW COMPARISON:  CT today and 05/19/2015.  Radiographs 05/19/2015. FINDINGS: The heart size and mediastinal contours are stable. The left apical pneumothorax is unchanged from the CT performed earlier today. There is no mediastinal shift. There are stable bibasilar airspace opacities with volume loss. No significant pleural effusion demonstrated.  IMPRESSION: Stable small to moderate left-sided pneumothorax from CT performed earlier today. No evidence of tension component. Electronically Signed   By: Richardean Sale M.D.   On: 06/24/2015 12:30   Ct Chest W Contrast  06/24/2015  CLINICAL DATA:  Lung cancer, cough for 1 week, left upper lobe mass. EXAM: CT CHEST WITH CONTRAST TECHNIQUE: Multidetector CT imaging of the chest was performed during intravenous contrast administration. CONTRAST:  10m ISOVUE-300 IOPAMIDOL (ISOVUE-300) INJECTION 61% COMPARISON:  05/19/2015. FINDINGS: Mediastinum/Nodes: No pathologically enlarged mediastinal, hilar or axillary lymph nodes. Atherosclerotic calcification of the arterial vasculature, including coronary arteries. Heart size normal. Small amount of pericardial fluid appears slightly increased from 05/19/2015. Lungs/Pleura: Spiculated left upper lobe nodule measures 1.3 x 1.6 cm, previously 0.9 x 1.4 cm. Small to moderate left pneumothorax, new. Severe centrilobular and paraseptal emphysema. Peribronchovascular nodularity in the left lower lobe is new. Collapse/ consolidation in the left lower lobe persists. Spiculated masslike consolidation in the right lower lobe, slightly improved. Trace right pleural effusion with pleural thickening. Debris is seen dependently in the lower trachea and both mainstem bronchi. Upper abdomen: Numerous sub cm low-attenuation lesions are again seen in the liver and are too small to characterize. Biliary hamartomas can have this appearance. Right adrenal gland is unremarkable. There may be left adrenal thickening, stable. Visualized portions of the kidneys, spleen, pancreas, stomach and bowel are grossly unremarkable. Musculoskeletal: No worrisome lytic or sclerotic lesions. IMPRESSION: 1. New small to moderate left pneumothorax, possibly due to bleb rupture. Critical Value/emergent results were called by telephone at the time of interpretation on 06/24/2015 at 10:41 am to E[place] PA ,  who verbally acknowledged these results. 2. Spiculated left upper lobe nodule, stable to minimally larger than on 05/19/2015. 3. Spiculated masslike consolidation in the right lower lobe, slightly improved, suggesting resolving pneumonia. 4. Persistent left lower lobe collapse/consolidation. 5. Small amount of pericardial fluid appears slightly increased from 05/19/2015. 6. Ill-defined peribronchovascular nodularity in the left lower lobe is new and most indicative of an infectious bronchiolitis. Electronically Signed   By: MLorin PicketM.D.   On: 06/24/2015 10:41   Dg Chest Port 1 View  06/25/2015  CLINICAL DATA:  Pneumothorax.  Lung cancer.  Chest tube. EXAM: PORTABLE CHEST 1 VIEW COMPARISON:  06/24/2015 FINDINGS: Pigtail catheter in the left chest cavity is unchanged. No pneumothorax. Left apical blebs noted. Left lower lobe consolidation slightly progressive. Extensive right lower lobe airspace disease also progressive since the prior study. No pleural effusion Severe COPD with apical scarring bilaterally. IMPRESSION: Left chest tube remains in place.  No pneumothorax on the left Progression of bilateral airspace disease which may represent collapse and/or pneumonia. Electronically Signed   By: CFranchot GalloM.D.   On: 06/25/2015 07:39   Dg Chest Portable 1 View  06/24/2015  CLINICAL DATA:  Left-sided chest tube placement EXAM: PORTABLE CHEST 1 VIEW COMPARISON:  Chest radiograph and chest CT obtained earlier in the day FINDINGS: Chest tube has been placed on the left with resolution of left-sided pneumothorax. Currently no pneumothorax is seen on either side. There is bullous disease in the left apex, stable. There is postoperative change on the right with volume loss and considerable scarring,  stable. There is stable consolidation in the right base. The mass lesion in the left upper lobe appears to slightly medial to the chest tube and is not well demonstrated. There is airspace consolidation in the  medial left base, stable. Heart size is normal. The pulmonary vascularity is stable with postoperative change on the right. Pulmonary vascularity is is normal on the left. No adenopathy is seen. No bone lesions are evident. IMPRESSION: Resolution of pneumothorax on the left with chest tube placement. Areas of consolidation in both lower lobes. Scarring on the right is stable with postoperative change. Nodular lesion left upper lobe is not well seen but is present medial to the chest tube. No new opacity. No change in cardiac silhouette. Electronically Signed   By: Lowella Grip III M.D.   On: 06/24/2015 13:33     ASSESSMENT AND PLAN:    * Left pneumothorax with acute hypoxic respiratory failure Status post chest tube placement by Dr. Genevive Bi.  Chest tube on water seal today. X-ray looks improved.  * Lung mass Oncology has been consulted.  * Leukocytosis He does have mild chronic leukocytosis. 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  * Hypokalemia Replaced and resolved  * COPD Continue home inhalers and nebulizer when necessary.  * Severe protein calorie malnutrition. Patient's appetite is improved since his pneumonia was treated recently. Added ensure  * DVT prophylaxis with Lovenox.  All the records are reviewed and case discussed with Care Management/Social Workerr. Management plans discussed with the patient, family and they are in agreement.  CODE STATUS: FULL CODE  DVT Prophylaxis: SCDs  TOTAL TIME TAKING CARE OF THIS PATIENT: 25 minutes.   POSSIBLE D/C IN 2-3 DAYS, DEPENDING ON CLINICAL CONDITION.  [name] R M.D on 06/25/2015 at 11:44 AM  Between 7am to 6pm - Pager - [phone]  After 6pm go to [email or web address] - password [email or web address] [facility]  Office  [phone]  CC: Primary care physician; [name], MD  Note: This dictation was prepared with Dragon dictation along with smaller phrase technology. Any transcriptional  errors that result from this process are unintentional.

## 2015-06-26 ENCOUNTER — Inpatient Hospital Stay: Payer: PPO

## 2015-06-26 DIAGNOSIS — R627 Adult failure to thrive: Secondary | ICD-10-CM | POA: Diagnosis not present

## 2015-06-26 DIAGNOSIS — C3490 Malignant neoplasm of unspecified part of unspecified bronchus or lung: Secondary | ICD-10-CM | POA: Diagnosis not present

## 2015-06-26 DIAGNOSIS — J449 Chronic obstructive pulmonary disease, unspecified: Secondary | ICD-10-CM | POA: Diagnosis not present

## 2015-06-26 DIAGNOSIS — J939 Pneumothorax, unspecified: Secondary | ICD-10-CM | POA: Diagnosis not present

## 2015-06-26 DIAGNOSIS — C3412 Malignant neoplasm of upper lobe, left bronchus or lung: Secondary | ICD-10-CM | POA: Diagnosis not present

## 2015-06-26 DIAGNOSIS — R918 Other nonspecific abnormal finding of lung field: Secondary | ICD-10-CM | POA: Diagnosis not present

## 2015-06-26 LAB — PHOSPHORUS: Phosphorus: 3 mg/dL (ref 2.5–4.6)

## 2015-06-26 LAB — CBC WITH DIFFERENTIAL/PLATELET
Basophils Absolute: 0.1 10*3/uL (ref 0–0.1)
Basophils Relative: 1 %
EOS ABS: 0.4 10*3/uL (ref 0–0.7)
HCT: 34.6 % — ABNORMAL LOW (ref 40.0–52.0)
HEMOGLOBIN: 10.9 g/dL — AB (ref 13.0–18.0)
LYMPHS ABS: 2.7 10*3/uL (ref 1.0–3.6)
MCH: 25 pg — AB (ref 26.0–34.0)
MCHC: 31.6 g/dL — AB (ref 32.0–36.0)
MCV: 79.1 fL — AB (ref 80.0–100.0)
Monocytes Absolute: 0.6 10*3/uL (ref 0.2–1.0)
Neutro Abs: 8 10*3/uL — ABNORMAL HIGH (ref 1.4–6.5)
Platelets: 359 10*3/uL (ref 150–440)
RBC: 4.37 MIL/uL — AB (ref 4.40–5.90)
RDW: 17.2 % — ABNORMAL HIGH (ref 11.5–14.5)
WBC: 11.7 10*3/uL — AB (ref 3.8–10.6)

## 2015-06-26 LAB — PROTIME-INR
INR: 1.16
Prothrombin Time: 15 seconds (ref 11.4–15.0)

## 2015-06-26 LAB — POTASSIUM: Potassium: 3.8 mmol/L (ref 3.5–5.1)

## 2015-06-26 LAB — APTT: APTT: 41 s — AB (ref 24–36)

## 2015-06-26 LAB — SURGICAL PCR SCREEN
MRSA, PCR: NEGATIVE
STAPHYLOCOCCUS AUREUS: NEGATIVE

## 2015-06-26 MED ORDER — FENTANYL CITRATE (PF) 100 MCG/2ML IJ SOLN
INTRAMUSCULAR | Status: AC
  Filled 2015-06-26: qty 4

## 2015-06-26 MED ORDER — FENTANYL CITRATE (PF) 100 MCG/2ML IJ SOLN
INTRAMUSCULAR | Status: AC | PRN
  Administered 2015-06-26: 25 ug via INTRAVENOUS

## 2015-06-26 MED ORDER — MIDAZOLAM HCL 5 MG/5ML IJ SOLN
INTRAMUSCULAR | Status: AC
  Filled 2015-06-26: qty 5

## 2015-06-26 MED ORDER — ENOXAPARIN SODIUM 40 MG/0.4ML ~~LOC~~ SOLN
40.0000 mg | SUBCUTANEOUS | Status: DC
  Administered 2015-06-27: 40 mg via SUBCUTANEOUS
  Filled 2015-06-26: qty 0.4

## 2015-06-26 NOTE — Care Management Important Message (Signed)
Important Message  Patient Details  Name: Rick Clark MRN: [id number] Date of Birth: 04-10-1945   Medicare Important Message Given:  Yes    Juliann Pulse A Dorsie Burich 06/26/2015, 3:10 PM

## 2015-06-26 NOTE — Progress Notes (Signed)
Initial Nutrition Assessment  DOCUMENTATION CODES:   Severe malnutrition in context of chronic illness  INTERVENTION:  -Pt would benefit from adding MVI -Monitor intake and cater to pt preferences. Recommend regular diet once able to take po following procedure -Agree with Ensure Enlive po BID, each supplement provides 350 kcal and 20 grams of protein -Discussed small frequent high calorie, high protein meal options with pt and family.  Pt verbalized understanding   NUTRITION DIAGNOSIS:   Malnutrition related to chronic illness as evidenced by severe depletion of body fat, severe depletion of muscle mass.    GOAL:   Patient will meet greater than or equal to 90% of their needs    MONITOR:   PO intake, Supplement acceptance  REASON FOR ASSESSMENT:    (low BMI)    ASSESSMENT:      Pt admitted with left pneumothorax s/p chest tube placement. Pt also has right upper lobe nodule and left upper lobe lesion per oncology note. Planning CT guided lung biopsy today.    Past Medical History  Diagnosis Date  . Pneumonia Nov '15  . COPD (chronic obstructive pulmonary disease) ([place])   . Shortness of breath dyspnea   . Lung cancer [facility]) 1990    Patient states RUL Thoracotomy.  . Lung mass     new spiculated LUL lung mass   Pt reports good appetite prior to admission. Typical day intake is breakfast oatmeal or 2 cereal bars or toast, at 10am candy bar or snack, at lunch hamburger or chicken and dumplings and pinto beans (if goes out to eat) reports eats 90% of it. For supper reports grand-dtr cooks meat and veggies or casserole which pt reports eats well.  Also drinks 2 ensure enlive per day.    Medications reviewed: phos nak  Labs reviewed: WBC 11.7, glucose 100  Nutrition-Focused physical exam completed. Findings are severe fat depletion, moderate to severe muscle depletion, and no edema.     Diet Order:  Diet NPO time specified Except for: Sips with Meds  Skin:   Reviewed, no issues  Last BM:  6/11  Height:   Ht Readings from Last 1 Encounters:  06/24/15 '5\' 5"'$  (1.651 m)    Weight: noted wt gain per wt encounters   Wt Readings from Last 10 Encounters:  06/24/15 89 lb (40.37 kg)  05/20/15 84 lb 8 oz (38.329 kg)  04/29/15 87 lb 1.3 oz (39.5 kg)  01/24/15 88 lb (39.917 kg)  01/17/15 87 lb 11.9 oz (39.8 kg)  09/18/14 84 lb (38.102 kg)  07/12/14 84 lb 10.5 oz (38.4 kg)  07/02/14 83 lb 6.4 oz (37.83 kg)     Ideal Body Weight:     BMI:  Body mass index is 14.81 kg/(m^2).  Estimated Nutritional Needs:   Kcal:  1200-1400 kcals/d  Protein:  60-80 g/d  Fluid:  1200-1483m/d   EDUCATION NEEDS:   No education needs identified at this time  Maycie Luera B. [place](pager) Weekend/On-Call pager ([phone]

## 2015-06-26 NOTE — Procedures (Signed)
CT left upper lung biopsy  Complications:  None  Blood Loss: none  See dictation in canopy pacs

## 2015-06-26 NOTE — Care Management Note (Signed)
Case Management Note  Patient Details  Name: BASEL DEFALCO MRN: [id number] Date of Birth: 11/12/1945  Subjective/Objective:                Patient  Admitted with left sided pneumothorax.  Patient has Chest tube to waterseal.  Recent dx of lunc CA.  Plan for biopsy while chest tube in place today.  Patient lives at home with his granddaughter.  A&O, independent, no medical equipment, and is still employed.  Patient is requiring acute O2 this admission, however has been weaned to 1 liter.     Action/Plan: Bedside RN to wean O2 as tolerated.  RNCM following for discharge planning  Expected Discharge Date:                  Expected Discharge Plan:     In-House Referral:     Discharge planning Services     Post Acute Care Choice:    Choice offered to:     DME Arranged:    DME Agency:     HH Arranged:    [place] Agency:     Status of Service:     Medicare Important Message Given:    Date Medicare IM Given:    Medicare IM give by:    Date Additional Medicare IM Given:    Additional Medicare Important Message give by:     If discussed at [place] of Stay Meetings, dates discussed:    Additional Comments:  Beverly Sessions, RN 06/26/2015, 10:30 AM

## 2015-06-26 NOTE — Progress Notes (Signed)
[facility]  Patient ID: Rick Clark, male   DOB: 14-Nov-1945, 70 y.o.   MRN: [id number]  HISTORY: He had a quiet evening. He states he's hungry. He's not short of breath. He denied any fevers.   Filed Vitals:   06/26/15 0829 06/26/15 0959  BP: 115/51 124/57  Pulse: 86 86  Temp: 98 F (36.7 C) 98.3 F (36.8 C)  Resp: 24      EXAM: Resp: Lungs are clear bilaterally.  No respiratory distress, normal effort. Heart:  Regular without murmurs Abd:  Abdomen is soft, non distended and non tender. No masses are palpable.  There is no rebound and no guarding.  Neurological: Alert and oriented to person, place, and time. Coordination normal.  Skin: Skin is warm and dry. No rash noted. No diaphoretic. No erythema. No pallor.  Psychiatric: Normal mood and affect. Normal behavior. Judgment and thought content normal.    ASSESSMENT: I did discuss his care today with Dr. Vanessa Ralphs. He will provide [place] with tissue sampling of the left upper lobe nodule. The patient does not have an air leak today. Dr. Yehuda Mao were performed a blood patch at the time of the biopsy.   PLAN:   I did discuss his care with Dr. Grayland Ormond yesterday. Dr. Grayland Ormond felt that if the biopsy were positive for a malignancy that he would be a candidate for radiation therapy and/or chemotherapy. The patient is not a surgical candidate but we will continue to manage his chest tube. The patient will be clinically monitored for any signs of an air leak.    [name], MD

## 2015-06-26 NOTE — Progress Notes (Signed)
[place] [facility] [place] NAME: Rick Clark    MR#:  [id number]  DATE OF BIRTH:  05/04/1945  SUBJECTIVE:  CHIEF COMPLAINT:   Chief Complaint  Patient presents with  . Shortness of Breath   Pain at the site of chest tube. No shortness of breath. Has chronic dry cough. No shortness of breath. Afebrile.  Left upper lobe pulmonary lesion biopsy later today.  REVIEW OF SYSTEMS:    Review of Systems  Constitutional: Positive for weight loss. Negative for fever and chills.  HENT: Negative for sore throat.   Eyes: Negative for blurred vision, double vision and pain.  Respiratory: Positive for cough. Negative for hemoptysis, shortness of breath and wheezing.   Cardiovascular: Positive for chest pain. Negative for palpitations, orthopnea and leg swelling.  Gastrointestinal: Negative for heartburn, nausea, vomiting, abdominal pain, diarrhea and constipation.  Genitourinary: Negative for dysuria and hematuria.  Musculoskeletal: Negative for back pain and joint pain.  Skin: Negative for rash.  Neurological: Negative for sensory change, speech change, focal weakness and headaches.  Endo/Heme/Allergies: Does not bruise/bleed easily.  Psychiatric/Behavioral: Negative for depression. The patient is not nervous/anxious.     DRUG ALLERGIES:  No Known Allergies  VITALS:  Blood pressure 124/63, pulse 107, temperature 98.3 F (36.8 C), temperature source Oral, resp. rate 19, height '5\' 5"'$  (1.651 m), weight 40.37 kg (89 lb), SpO2 94 %.  PHYSICAL EXAMINATION:   Physical Exam  GENERAL:  70 y.o.-year-old patient lying in the bed with no acute distress.  EYES: Pupils equal, round, reactive to light and accommodation. No scleral icterus. Extraocular muscles intact.  HEENT: Head atraumatic, normocephalic. Oropharynx and nasopharynx clear.  NECK:  Supple, no jugular venous distention. No thyroid enlargement, no tenderness.  LUNGS: Normal breath  sounds bilaterally, no wheezing, rales, rhonchi. No use of accessory muscles of respiration. Left chest tube in place CARDIOVASCULAR: S1, S2 normal. No murmurs, rubs, or gallops.  ABDOMEN: Soft, nontender, nondistended. Bowel sounds present. No organomegaly or mass.  EXTREMITIES: No cyanosis, clubbing or edema b/l.   Muscle wasting NEUROLOGIC: Cranial nerves II through XII are intact. No focal Motor or sensory deficits b/l.   PSYCHIATRIC: The patient is alert and oriented x 3.  SKIN: No obvious rash, lesion, or ulcer.   LABORATORY PANEL:   CBC  Recent Labs Lab 06/26/15 0424  WBC 11.7*  HGB 10.9*  HCT 34.6*  PLT 359   ------------------------------------------------------------------------------------------------------------------ Chemistries   Recent Labs Lab 06/24/15 1134  06/25/15 0419 06/26/15 0424  NA  --   < > 135  --   K  --   < > 3.8 3.8  CL  --   < > 100*  --   CO2  --   < > 30  --   GLUCOSE  --   < > 100*  --   BUN  --   < > 7  --   CREATININE  --   < > 0.49*  --   CALCIUM  --   < > 8.0*  --   MG 1.8  --   --   --   AST  --   < > 16  --   ALT  --   < > 12*  --   ALKPHOS  --   < > 81  --   BILITOT  --   < > 0.3  --   < > = values in this interval not displayed. ------------------------------------------------------------------------------------------------------------------  Cardiac Enzymes  Recent Labs Lab 06/24/15 1148  TROPONINI <0.03   ------------------------------------------------------------------------------------------------------------------  RADIOLOGY:  Dg Chest 2 View  06/24/2015  CLINICAL DATA:  Left-sided pneumothorax on CT performed today. History of thoracotomy for lung cancer. Patient denies shortness of breath. EXAM: CHEST  2 VIEW COMPARISON:  CT today and 05/19/2015.  Radiographs 05/19/2015. FINDINGS: The heart size and mediastinal contours are stable. The left apical pneumothorax is unchanged from the CT performed earlier today.  There is no mediastinal shift. There are stable bibasilar airspace opacities with volume loss. No significant pleural effusion demonstrated. IMPRESSION: Stable small to moderate left-sided pneumothorax from CT performed earlier today. No evidence of tension component. Electronically Signed   By: Richardean Sale M.D.   On: 06/24/2015 12:30   Dg Chest Port 1 View  06/25/2015  CLINICAL DATA:  Pneumothorax.  Lung cancer.  Chest tube. EXAM: PORTABLE CHEST 1 VIEW COMPARISON:  06/24/2015 FINDINGS: Pigtail catheter in the left chest cavity is unchanged. No pneumothorax. Left apical blebs noted. Left lower lobe consolidation slightly progressive. Extensive right lower lobe airspace disease also progressive since the prior study. No pleural effusion Severe COPD with apical scarring bilaterally. IMPRESSION: Left chest tube remains in place.  No pneumothorax on the left Progression of bilateral airspace disease which may represent collapse and/or pneumonia. Electronically Signed   By: Franchot Gallo M.D.   On: 06/25/2015 07:39   Dg Chest Portable 1 View  06/24/2015  CLINICAL DATA:  Left-sided chest tube placement EXAM: PORTABLE CHEST 1 VIEW COMPARISON:  Chest radiograph and chest CT obtained earlier in the day FINDINGS: Chest tube has been placed on the left with resolution of left-sided pneumothorax. Currently no pneumothorax is seen on either side. There is bullous disease in the left apex, stable. There is postoperative change on the right with volume loss and considerable scarring, stable. There is stable consolidation in the right base. The mass lesion in the left upper lobe appears to slightly medial to the chest tube and is not well demonstrated. There is airspace consolidation in the medial left base, stable. Heart size is normal. The pulmonary vascularity is stable with postoperative change on the right. Pulmonary vascularity is is normal on the left. No adenopathy is seen. No bone lesions are evident.  IMPRESSION: Resolution of pneumothorax on the left with chest tube placement. Areas of consolidation in both lower lobes. Scarring on the right is stable with postoperative change. Nodular lesion left upper lobe is not well seen but is present medial to the chest tube. No new opacity. No change in cardiac silhouette. Electronically Signed   By: Lowella Grip III M.D.   On: 06/24/2015 13:33     ASSESSMENT AND PLAN:    * Left pneumothorax with acute hypoxic respiratory failure Status post chest tube placement by Dr. Genevive Bi.  X-ray looks improved.  * Lung mass bilateral Oncology has been consulted. Left upper lobe lesion biopsy today.  * Leukocytosis He does have mild chronic leukocytosis. 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  * Hypokalemia Replaced and resolved  * COPD Continue home inhalers and nebulizer when necessary.  * Severe protein calorie malnutrition. Ensure  * DVT prophylaxis with Lovenox.  All the records are reviewed and case discussed with Care Management/Social Workerr. Management plans discussed with the patient, family and they are in agreement.  CODE STATUS: FULL CODE  DVT Prophylaxis: SCDs  TOTAL TIME TAKING CARE OF THIS PATIENT: 25 minutes.   POSSIBLE D/C IN 2-3 DAYS, DEPENDING  ON CLINICAL CONDITION.  [name] R M.D on 06/26/2015 at 11:56 AM  Between 7am to 6pm - Pager - [phone]  After 6pm go to [email or web address] - password [email or web address] [facility]  Office  [phone]  CC: Primary care physician; [name], MD  Note: This dictation was prepared with Dragon dictation along with smaller phrase technology. Any transcriptional errors that result from this process are unintentional.

## 2015-06-27 DIAGNOSIS — G939 Disorder of brain, unspecified: Secondary | ICD-10-CM

## 2015-06-27 DIAGNOSIS — J939 Pneumothorax, unspecified: Secondary | ICD-10-CM | POA: Diagnosis not present

## 2015-06-27 DIAGNOSIS — R918 Other nonspecific abnormal finding of lung field: Secondary | ICD-10-CM | POA: Diagnosis not present

## 2015-06-27 DIAGNOSIS — F17210 Nicotine dependence, cigarettes, uncomplicated: Secondary | ICD-10-CM | POA: Diagnosis not present

## 2015-06-27 DIAGNOSIS — R634 Abnormal weight loss: Secondary | ICD-10-CM | POA: Diagnosis not present

## 2015-06-27 DIAGNOSIS — J449 Chronic obstructive pulmonary disease, unspecified: Secondary | ICD-10-CM | POA: Diagnosis not present

## 2015-06-27 DIAGNOSIS — C3412 Malignant neoplasm of upper lobe, left bronchus or lung: Secondary | ICD-10-CM

## 2015-06-27 DIAGNOSIS — R627 Adult failure to thrive: Secondary | ICD-10-CM | POA: Diagnosis not present

## 2015-06-27 DIAGNOSIS — R0602 Shortness of breath: Secondary | ICD-10-CM | POA: Diagnosis not present

## 2015-06-27 DIAGNOSIS — C3490 Malignant neoplasm of unspecified part of unspecified bronchus or lung: Secondary | ICD-10-CM | POA: Diagnosis not present

## 2015-06-27 LAB — SURGICAL PATHOLOGY

## 2015-06-27 NOTE — Progress Notes (Signed)
Rick Clark  [phone] [phone]  ID: Rick Clark OB: 05-08-1945  MR#: [id number]  TGG#:[id number]  Patient Care Team: [name], Clark as PCP - General (Internal Medicine)  CHIEF COMPLAINT:  Chief Complaint  Patient presents with  . Shortness of Breath    INTERVAL HISTORY: Patient improved today. Does not complain of shortness of breath or chest pain. Currently eating dinner and offers no complaints.  REVIEW OF SYSTEMS:   Review of Systems  Constitutional: Negative.  Negative for fever, weight loss and malaise/fatigue.  Respiratory: Positive for shortness of breath.   Cardiovascular: Negative.  Negative for chest pain.  Gastrointestinal: Negative.   Genitourinary: Negative.   Musculoskeletal: Negative.   Neurological: Negative.  Negative for weakness.    As per HPI. Otherwise, a complete review of systems is negatve.  PAST MEDICAL HISTORY: Past Medical History  Diagnosis Date  . Pneumonia Nov '15  . COPD (chronic obstructive pulmonary disease) ([place])   . Shortness of breath dyspnea   . Rick Clark) 1990    Patient states RUL Thoracotomy.  . Rick mass     new spiculated LUL Rick mass    PAST SURGICAL HISTORY: Past Surgical History  Procedure Laterality Date  . Rick lobectomy Right     Right upper lobe  . Flexible bronchoscopy Bilateral 09/18/2014    Procedure: FLEXIBLE BRONCHOSCOPY;  Surgeon: Allyne Gee, Clark;  Location: [facility] ORS;  Service: Pulmonary;  Laterality: Bilateral;    FAMILY HISTORY Family History  Problem Relation Age of Onset  . Coronary artery disease Father   . Diabetes Mother        ADVANCED DIRECTIVES:    HEALTH MAINTENANCE: Social History  Substance Use Topics  . Smoking status: Current Every Day Smoker -- 0.50 packs/day for 55 years    Types: Cigarettes  . Smokeless tobacco: Never Used  . Alcohol Use: No     Colonoscopy:  PAP:  Bone density:  Lipid panel:  No Known  Allergies  Current Facility-Administered Medications  Medication Dose Route Frequency Provider Last Rate Last Dose  . 0.9 %  sodium chloride infusion   Intravenous Continuous [name], Clark 10 mL/hr at 06/26/15 0900    . acetaminophen (TYLENOL) tablet 650 mg  650 mg Oral Q6H PRN [name], Clark      . albuterol (PROVENTIL) (2.5 MG/3ML) 0.083% nebulizer solution 2.5 mg  2.5 mg Nebulization Q4H while awake [name], Clark   2.5 mg at 06/27/15 1549  . albuterol (PROVENTIL) (2.5 MG/3ML) 0.083% nebulizer solution 3 mL  3 mL Inhalation Q6H PRN [name], Clark   3 mL at 06/25/15 0620  . aspirin chewable tablet 81 mg  81 mg Oral [name], Clark   81 mg at 06/27/15 0551  . bisacodyl (DULCOLAX) EC tablet 10 mg  10 mg Oral Daily [name], Clark   10 mg at 06/26/15 1000  . enoxaparin (LOVENOX) injection 40 mg  40 mg Subcutaneous Q24H [name], [name]      . feeding supplement (ENSURE ENLIVE) (ENSURE ENLIVE) liquid 237 mL  237 mL Oral BID BM [name], Clark   237 mL at 06/27/15 1400  . guaiFENesin (ROBITUSSIN) 100 MG/5ML solution 100 mg  5 mL Oral Q4H PRN [name], Clark   100 mg at 06/26/15 0922  . mometasone-formoterol (DULERA) 200-5 MCG/ACT inhaler 2 puff  2 puff Inhalation BID [name], Clark   2 puff at 06/27/15 0723  .  ondansetron (ZOFRAN) injection 4 mg  4 mg Intravenous Q6H PRN [name], Clark      . oxyCODONE (ROXICODONE) 5 MG/5ML solution 5 mg  5 mg Oral Q4H PRN [name], Clark   5 mg at 06/26/15 2043  . potassium & sodium phosphates (PHOS-NAK) 280-160-250 MG packet 2 packet  2 packet Oral BID WC [name], Clark   2 packet at 06/27/15 [phone]  . tamsulosin (FLOMAX) capsule 0.4 mg  0.4 mg Oral Daily [name], Clark   0.4 mg at 06/27/15 0942  . tiotropium (SPIRIVA) inhalation capsule 18 mcg  18 mcg Inhalation Daily [name], Clark   18 mcg at 06/27/15 0723    OBJECTIVE: Filed Vitals:   06/27/15 0504 06/27/15 1158  BP: 98/56 110/65  Pulse: 72 73  Temp: 98 F (36.7  C) 97.8 F (36.6 C)  Resp: 22 19     Body mass index is 14.81 kg/(m^2).    ECOG FS:1 - Symptomatic but completely ambulatory  General: Thin, no acute distress. Eyes: Pink conjunctiva, anicteric sclera. Lungs: Clear to auscultation bilaterally. Chest tube in place in left Rick. Heart: Regular rate and rhythm. No rubs, murmurs, or gallops. Abdomen: Soft, nontender, nondistended. No organomegaly noted, normoactive bowel sounds. Musculoskeletal: No edema, cyanosis, or clubbing. Neuro: Alert, answering all questions appropriately. Cranial nerves grossly intact. Skin: No rashes or petechiae noted. Psych: Normal affect.    LAB RESULTS:  Lab Results  Component Value Date   NA 135 06/25/2015   K 3.8 06/26/2015   CL 100* 06/25/2015   CO2 30 06/25/2015   GLUCOSE 100* 06/25/2015   BUN 7 06/25/2015   CREATININE 0.49* 06/25/2015   CALCIUM 8.0* 06/25/2015   PROT 6.5 06/25/2015   ALBUMIN 2.4* 06/25/2015   AST 16 06/25/2015   ALT 12* 06/25/2015   ALKPHOS 81 06/25/2015   BILITOT 0.3 06/25/2015   GFRNONAA >60 06/25/2015   GFRAA >60 06/25/2015    Lab Results  Component Value Date   WBC 11.7* 06/26/2015   NEUTROABS 8.0* 06/26/2015   HGB 10.9* 06/26/2015   HCT 34.6* 06/26/2015   MCV 79.1* 06/26/2015   PLT 359 06/26/2015     STUDIES: Dg Chest 2 View  06/24/2015  CLINICAL DATA:  Left-sided pneumothorax on CT performed today. History of thoracotomy for Rick cancer. Patient denies shortness of breath. EXAM: CHEST  2 VIEW COMPARISON:  CT today and 05/19/2015.  Radiographs 05/19/2015. FINDINGS: The heart size and mediastinal contours are stable. The left apical pneumothorax is unchanged from the CT performed earlier today. There is no mediastinal shift. There are stable bibasilar airspace opacities with volume loss. No significant pleural effusion demonstrated. IMPRESSION: Stable small to moderate left-sided pneumothorax from CT performed earlier today. No evidence of tension component.  Electronically Signed   By: Rick Sale M.D.   On: 06/24/2015 12:30   Ct Chest W Contrast  06/24/2015  CLINICAL DATA:  Rick cancer, cough for 1 week, left upper lobe mass. EXAM: CT CHEST WITH CONTRAST TECHNIQUE: Multidetector CT imaging of the chest was performed during intravenous contrast administration. CONTRAST:  80m ISOVUE-300 IOPAMIDOL (ISOVUE-300) INJECTION 61% COMPARISON:  05/19/2015. FINDINGS: Mediastinum/Nodes: No pathologically enlarged mediastinal, hilar or axillary lymph nodes. Atherosclerotic calcification of the arterial vasculature, including coronary arteries. Heart size normal. Small amount of pericardial fluid appears slightly increased from 05/19/2015. Lungs/Pleura: Spiculated left upper lobe nodule measures 1.3 x 1.6 cm, previously 0.9 x 1.4 cm. Small to moderate left pneumothorax, new. Severe centrilobular and paraseptal emphysema. Peribronchovascular  nodularity in the left lower lobe is new. Collapse/ consolidation in the left lower lobe persists. Spiculated masslike consolidation in the right lower lobe, slightly improved. Trace right pleural effusion with pleural thickening. Debris is seen dependently in the lower trachea and both mainstem bronchi. Upper abdomen: Numerous sub cm low-attenuation lesions are again seen in the liver and are too small to characterize. Biliary hamartomas can have this appearance. Right adrenal gland is unremarkable. There may be left adrenal thickening, stable. Visualized portions of the kidneys, spleen, pancreas, stomach and bowel are grossly unremarkable. Musculoskeletal: No worrisome lytic or sclerotic lesions. IMPRESSION: 1. New small to moderate left pneumothorax, possibly due to bleb rupture. Critical Value/emergent results were called by telephone at the time of interpretation on 06/24/2015 at 10:41 am to [place] , who verbally acknowledged these results. 2. Spiculated left upper lobe nodule, stable to minimally larger than on 05/19/2015. 3.  Spiculated masslike consolidation in the right lower lobe, slightly improved, suggesting resolving pneumonia. 4. Persistent left lower lobe collapse/consolidation. 5. Small amount of pericardial fluid appears slightly increased from 05/19/2015. 6. Ill-defined peribronchovascular nodularity in the left lower lobe is new and most indicative of an infectious bronchiolitis. Electronically Signed   By: Lorin Picket M.D.   On: 06/24/2015 10:41   Ct Biopsy  06/26/2015  INDICATION: Left upper lobe Rick mass EXAM: CT GUIDED Clark BIOPSY OF LEFT UPPER LOBE Rick MASS MEDICATIONS: None. ANESTHESIA/SEDATION: Fentanyl 25 mcg IV The patient was continuously monitored during the procedure by the interventional radiology nurse under my direct supervision. FLUOROSCOPY TIME:  Not applicable COMPLICATIONS: Some mild post biopsy parenchymal hemorrhage was noted. No pneumothorax was seen. PROCEDURE: Informed written consent was obtained from the patient after a thorough discussion of the procedural risks, benefits and alternatives. All questions were addressed. Maximal Sterile Barrier Technique was utilized including caps, mask, sterile gowns, sterile gloves, sterile drape, hand hygiene and skin antiseptic. A timeout was performed prior to the initiation of the procedure. The left posterior chest wall was prepped with Chlorhexidine in a sterile fashion, and a sterile drape was applied covering the operative field. A sterile gown and sterile gloves were used for the procedure. Local anesthesia was provided with 1% Lidocaine. Utilizing CT fluoroscopic guidance, a 12 gauge guiding needle was placed into the left upper lobe adjacent to the known mass lesion. Multiple 18 gauge Clark biopsies were then obtained. These were sent for pathologic evaluation. The guiding needle was then removed and Gel-Foam placed to aid in prevention of pneumothorax. Followup scanning shows no significant pneumothorax. Some mild parenchymal hemorrhage was  noted. The patient tolerated the procedure well was returned to his room in satisfactory condition. IMPRESSION: Successful CT-guided left upper lobe Rick biopsy. Electronically Signed   By: Inez Catalina M.D.   On: 06/26/2015 12:39   Dg Chest Port 1 View  06/25/2015  CLINICAL DATA:  Pneumothorax.  Rick cancer.  Chest tube. EXAM: PORTABLE CHEST 1 VIEW COMPARISON:  06/24/2015 FINDINGS: Pigtail catheter in the left chest cavity is unchanged. No pneumothorax. Left apical blebs noted. Left lower lobe consolidation slightly progressive. Extensive right lower lobe airspace disease also progressive since the prior study. No pleural effusion Severe COPD with apical scarring bilaterally. IMPRESSION: Left chest tube remains in place.  No pneumothorax on the left Progression of bilateral airspace disease which may represent collapse and/or pneumonia. Electronically Signed   By: Franchot Gallo M.D.   On: 06/25/2015 07:39   Dg Chest Portable 1 View  06/24/2015  CLINICAL DATA:  Left-sided chest tube placement EXAM: PORTABLE CHEST 1 VIEW COMPARISON:  Chest radiograph and chest CT obtained earlier in the day FINDINGS: Chest tube has been placed on the left with resolution of left-sided pneumothorax. Currently no pneumothorax is seen on either side. There is bullous disease in the left apex, stable. There is postoperative change on the right with volume loss and considerable scarring, stable. There is stable consolidation in the right base. The mass lesion in the left upper lobe appears to slightly medial to the chest tube and is not well demonstrated. There is airspace consolidation in the medial left base, stable. Heart size is normal. The pulmonary vascularity is stable with postoperative change on the right. Pulmonary vascularity is is normal on the left. No adenopathy is seen. No bone lesions are evident. IMPRESSION: Resolution of pneumothorax on the left with chest tube placement. Areas of consolidation in both lower  lobes. Scarring on the right is stable with postoperative change. Nodular lesion left upper lobe is not well seen but is present medial to the chest tube. No new opacity. No change in cardiac silhouette. Electronically Signed   By: Lowella Grip III M.D.   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  PLAN:    1. Squamous cell carcinoma: Pathology results confirm malignancy. Given patient's poor performance status as well as Rick function, surgery and chemotherapy likely are not options but patient may benefit from SBRT.  Once chest tube is removed, patient can be discharged from the hospital in follow-up in the [place] with both medical and radiation oncology for further evaluation and treatment planning. 2. Right lower lobe lesion: Mildly improved. Still suspicious for malignancy, but will continue to monitor with CT scans. 3. Pneumothorax: Appears to be spontaneous: Chest tube in place. Appreciate thoracic surgery input.  Appreciate consult, call with questions.  Lloyd Huger, Clark   06/27/2015 5:35 PM

## 2015-06-27 NOTE — Progress Notes (Signed)
Rick Clark at [place] NAME: Rick Clark    MR#:  [id number]  DATE OF BIRTH:  [birth year]  SUBJECTIVE:  CHIEF COMPLAINT:   Chief Complaint  Patient presents with  . Shortness of Breath   No shortness of breath. Has chronic dry cough. No shortness of breath. Afebrile.  Left upper lobe pulmonary lesion biopsy - 06/26/2015  REVIEW OF SYSTEMS:    Review of Systems  Constitutional: Positive for weight loss. Negative for fever and chills.  HENT: Negative for sore throat.   Eyes: Negative for blurred vision, double vision and pain.  Respiratory: Positive for cough. Negative for hemoptysis, shortness of breath and wheezing.   Cardiovascular: Positive for chest pain. Negative for palpitations, orthopnea and leg swelling.  Gastrointestinal: Negative for heartburn, nausea, vomiting, abdominal pain, diarrhea and constipation.  Genitourinary: Negative for dysuria and hematuria.  Musculoskeletal: Negative for back pain and joint pain.  Skin: Negative for rash.  Neurological: Negative for sensory change, speech change, focal weakness and headaches.  Endo/Heme/Allergies: Does not bruise/bleed easily.  Psychiatric/Behavioral: Negative for depression. The patient is not nervous/anxious.     DRUG ALLERGIES:  No Known Allergies  VITALS:  Blood pressure 110/65, pulse 73, temperature 97.8 F (36.6 C), temperature source Oral, resp. rate 19, height '5\' 5"'$  (1.651 m), weight 40.37 kg (89 lb), SpO2 95 %.  PHYSICAL EXAMINATION:   Physical Exam  GENERAL:  70 y.o.-year-old patient lying in the bed with no acute distress.  EYES: Pupils equal, round, reactive to light and accommodation. No scleral icterus. Extraocular muscles intact.  HEENT: Head atraumatic, normocephalic. Oropharynx and nasopharynx clear.  NECK:  Supple, no jugular venous distention. No thyroid enlargement, no tenderness.  LUNGS: Normal breath sounds bilaterally, no wheezing,  rales, rhonchi. No use of accessory muscles of respiration. Left chest tube in place CARDIOVASCULAR: S1, S2 normal. No murmurs, rubs, or gallops.  ABDOMEN: Soft, nontender, nondistended. Bowel sounds present. No organomegaly or mass.  EXTREMITIES: No cyanosis, clubbing or edema b/l.   Muscle wasting NEUROLOGIC: Cranial nerves II through XII are intact. No focal Motor or sensory deficits b/l.   PSYCHIATRIC: The patient is alert and oriented x 3.  SKIN: No obvious rash, lesion, or ulcer.   LABORATORY PANEL:   CBC  Recent Labs Lab 06/26/15 0424  WBC 11.7*  HGB 10.9*  HCT 34.6*  PLT 359   ------------------------------------------------------------------------------------------------------------------ Chemistries   Recent Labs Lab 06/24/15 1134  06/25/15 0419 06/26/15 0424  NA  --   < > 135  --   K  --   < > 3.8 3.8  CL  --   < > 100*  --   CO2  --   < > 30  --   GLUCOSE  --   < > 100*  --   BUN  --   < > 7  --   CREATININE  --   < > 0.49*  --   CALCIUM  --   < > 8.0*  --   MG 1.8  --   --   --   AST  --   < > 16  --   ALT  --   < > 12*  --   ALKPHOS  --   < > 81  --   BILITOT  --   < > 0.3  --   < > = values in this interval not displayed. ------------------------------------------------------------------------------------------------------------------  Cardiac Enzymes  Recent Labs Lab 06/24/15  [place] <0.03   ------------------------------------------------------------------------------------------------------------------  RADIOLOGY:  Ct Biopsy  06/26/2015  INDICATION: Left upper lobe lung mass EXAM: CT GUIDED CORE BIOPSY OF LEFT UPPER LOBE LUNG MASS MEDICATIONS: None. ANESTHESIA/SEDATION: Fentanyl 25 mcg IV The patient was continuously monitored during the procedure by the interventional radiology nurse under my direct supervision. FLUOROSCOPY TIME:  Not applicable COMPLICATIONS: Some mild post biopsy parenchymal hemorrhage was noted. No pneumothorax was  seen. PROCEDURE: Informed written consent was obtained from the patient after a thorough discussion of the procedural risks, benefits and alternatives. All questions were addressed. Maximal Sterile Barrier Technique was utilized including caps, mask, sterile gowns, sterile gloves, sterile drape, hand hygiene and skin antiseptic. A timeout was performed prior to the initiation of the procedure. The left posterior chest wall was prepped with Chlorhexidine in a sterile fashion, and a sterile drape was applied covering the operative field. A sterile gown and sterile gloves were used for the procedure. Local anesthesia was provided with 1% Lidocaine. Utilizing CT fluoroscopic guidance, a 93 gauge guiding needle was placed into the left upper lobe adjacent to the known mass lesion. Multiple 18 gauge core biopsies were then obtained. These were sent for pathologic evaluation. The guiding needle was then removed and Gel-Foam placed to aid in prevention of pneumothorax. Followup scanning shows no significant pneumothorax. Some mild parenchymal hemorrhage was noted. The patient tolerated the procedure well was returned to his room in satisfactory condition. IMPRESSION: Successful CT-guided left upper lobe lung biopsy. Electronically Signed   By: Inez Catalina M.D.   On: 06/26/2015 12:39     ASSESSMENT AND PLAN:    * Left pneumothorax with acute hypoxic respiratory failure Status post chest tube placement by Dr. Genevive Bi.  X-ray looked improved. Discussed with Dr. Genevive Bi. Water seal today. Likely removal tomorrow  * Lung mass bilateral Oncology has been consulted. Left upper lobe lesion biopsy done. Pathology pending  * Leukocytosis He does have mild chronic leukocytosis. 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 Close to normal Afebrile  * Hypokalemia Replaced and resolved  * COPD Continue home inhalers and nebulizer when necessary.  * Severe protein calorie malnutrition. Ensure  * DVT  prophylaxis with Lovenox.  All the records are reviewed and case discussed with Care Management/Social Workerr. Management plans discussed with the patient, family and they are in agreement.  CODE STATUS: FULL CODE  DVT Prophylaxis: SCDs  TOTAL TIME TAKING CARE OF THIS PATIENT: 25 minutes.   POSSIBLE D/C TOMORROW, DEPENDING ON CLINICAL CONDITION.  [name] R M.D on 06/27/2015 at 1:15 PM  Between 7am to 6pm - Pager - [phone]  After 6pm go to [email or web address] - password [email or web address] [facility]  Office  [phone]  CC: Primary care physician; [name], MD  Note: This dictation was prepared with Dragon dictation along with smaller phrase technology. Any transcriptional errors that result from this process are unintentional.

## 2015-06-27 NOTE — Progress Notes (Signed)
[facility]  Patient ID: IRAN ROWE, male   DOB: 1945-11-21, 70 y.o.   MRN: [id number]  HISTORY: Tolerated his CT guided biopsy without problems.  Not short of breath.  Feels hungry.  No pain.   Filed Vitals:   06/27/15 0052 06/27/15 0504  BP: 110/56 98/56  Pulse: 74 72  Temp: 97.9 F (36.6 C) 98 F (36.7 C)  Resp: 24 22     EXAM: Resp: Lungs are clear bilaterally but very distant.  No respiratory distress, normal effort. Heart:  Regular without murmurs Abd:  Abdomen is soft, non distended and non tender. No masses are palpable.  There is no rebound and no guarding.  Neurological: Alert and oriented to person, place, and time. Coordination normal.  Skin: Skin is warm and dry. No rash noted. No diaphoretic. No erythema. No pallor.  Psychiatric: Normal mood and affect. Normal behavior. Judgment and thought content normal.   No air leak seen with cough or Valsalva   ASSESSMENT: Spontaneous pneumothorax.  No air leak so will place chest tube to water sea.  Repeat CXRay in the morning LUL mass.  Pathology still pending     PLAN:   Repeat CXRay in the morning.  Check on Pathology.    [name], MD

## 2015-06-28 ENCOUNTER — Inpatient Hospital Stay: Payer: PPO

## 2015-06-28 DIAGNOSIS — Z4682 Encounter for fitting and adjustment of non-vascular catheter: Secondary | ICD-10-CM | POA: Diagnosis not present

## 2015-06-28 DIAGNOSIS — C3490 Malignant neoplasm of unspecified part of unspecified bronchus or lung: Secondary | ICD-10-CM | POA: Diagnosis not present

## 2015-06-28 NOTE — Progress Notes (Signed)
Did well overnight.  No new problems.  Afebrile.  Oxygen sats greter than 90% on room air.    No air leak this morning.  Chest tube site is clean and dry  Independent review of CXray shows no pneumothorax  Seen by Dr. Grayland Ormond - squamous cell in LUL.  Will start therapy next week  Chest tube removed without incident.  Will discharge to home.  Instructions given.  To see me in one week.

## 2015-06-28 NOTE — Progress Notes (Signed)
Patient discharged home with family.  All discharge instructions reviewed and discharge paperwork given to patient.  Patient verbalized understanding.  IV removed in tact and wound care instructions reviewed. All questions and concerns addressed. Patient's wife at bedside for transfer home.

## 2015-06-28 NOTE — Discharge Summary (Signed)
Physician Discharge Summary  Patient ID: Rick Clark MRN: [id number] DOB/AGE: May 09, 1945 70 y.o.  Admit date: 06/24/2015 Discharge date: 06/28/2015   Discharge Diagnoses:  Active Problems:   Pneumothorax   Procedures: Insertion of left sided chest tube.  Percutaneous lung biopsy   Hospital Course: Had a chest ct scan as part of routing followup of left lung mass.  Seen a spontaneous pneumothorax on CT scan.  Pigtail catheter placed.  No air leak.  Underwent CT guided lung biopsy - Squamous cell carcinoma.  Seen by Dr. Grayland Ormond.  Will start therapy.  Chest tube removed without incident.  Discharged to home in stable condition.    Disposition: 01-Home or Self Care  Discharge Instructions    Diet - low sodium heart healthy    Complete by:  As directed      Discharge wound care:    Complete by:  As directed   Remove dressing in 48 hours and shower or wash over area with soap and water.  Call the office and make appointment for one week with chest xray.     Increase activity slowly    Complete by:  As directed      Remove dressing in 48 hours    Complete by:  As directed             Medication List    STOP taking these medications        amoxicillin-clavulanate 400-57 MG/5ML suspension  Commonly known as:  AUGMENTIN     potassium & sodium phosphates 280-160-250 MG Pack  Commonly known as:  PHOS-NAK     tamsulosin 0.4 MG Caps capsule  Commonly known as:  FLOMAX      TAKE these medications        albuterol 108 (90 Base) MCG/ACT inhaler  Commonly known as:  PROVENTIL HFA;VENTOLIN HFA  Inhale 1 puff into the lungs every 6 (six) hours as needed for wheezing or shortness of breath.     aspirin 81 MG chewable tablet  Chew 81 mg by mouth every morning.     budesonide-formoterol 160-4.5 MCG/ACT inhaler  Commonly known as:  SYMBICORT  Inhale 2 puffs into the lungs 2 (two) times daily.     guaiFENesin 100 MG/5ML Soln  Commonly known as:  ROBITUSSIN  Take 5 mLs (100  mg total) by mouth every 4 (four) hours as needed for cough or to loosen phlegm.     tiotropium 18 MCG inhalation capsule  Commonly known as:  SPIRIVA  Place 18 mcg into inhaler and inhale daily.         [name], MD

## 2015-06-28 NOTE — Progress Notes (Signed)
Pt remained at 96% on RA s/p chest tube removal and above 92% at RA while walking.

## 2015-07-02 ENCOUNTER — Other Ambulatory Visit: Payer: Self-pay

## 2015-07-02 DIAGNOSIS — R222 Localized swelling, mass and lump, trunk: Secondary | ICD-10-CM

## 2015-07-05 ENCOUNTER — Encounter: Payer: Self-pay | Admitting: Cardiothoracic Surgery

## 2015-07-05 ENCOUNTER — Other Ambulatory Visit: Payer: Self-pay

## 2015-07-05 ENCOUNTER — Ambulatory Visit
Admission: RE | Admit: 2015-07-05 | Discharge: 2015-07-05 | Disposition: A | Discharge status: Home / Self Care | Payer: PPO | Source: Ambulatory Visit | Attending: Cardiothoracic Surgery | Admitting: Cardiothoracic Surgery

## 2015-07-05 ENCOUNTER — Ambulatory Visit (INDEPENDENT_AMBULATORY_CARE_PROVIDER_SITE_OTHER): Payer: PPO | Admitting: Cardiothoracic Surgery

## 2015-07-05 VITALS — BP 116/66 | HR 87 | Temp 98.3°F | Wt 81.0 lb

## 2015-07-05 DIAGNOSIS — I700 Atherosclerosis of aorta: Secondary | ICD-10-CM | POA: Insufficient documentation

## 2015-07-05 DIAGNOSIS — R918 Other nonspecific abnormal finding of lung field: Secondary | ICD-10-CM

## 2015-07-05 DIAGNOSIS — J939 Pneumothorax, unspecified: Secondary | ICD-10-CM | POA: Diagnosis not present

## 2015-07-05 DIAGNOSIS — R222 Localized swelling, mass and lump, trunk: Secondary | ICD-10-CM

## 2015-07-05 NOTE — Progress Notes (Signed)
Rick Clark Inpatient Post-Op Note  Patient ID: Rick Clark, male   DOB: December 01, 1945, 70 y.o.   MRN: [id number]  HISTORY: This patient comes in today in follow-up. He did suffer a spontaneous pneumothorax on the left about 2 weeks ago which was managed with a chest tube. 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 He states he had an episode of hemoptysis on Saturday and Sunday of last week but has had none since. He does not complain of any significant shortness of breath. He denied any fevers.   Filed Vitals:   07/05/15 1116  BP: 116/66  Pulse: 87  Temp: 98.3 F (36.8 C)     EXAM: Resp: Lungs are clear bilaterally But are very distant..  No respiratory distress, normal effort. Heart:  Regular without murmurs Abd:  Abdomen is soft, non distended and non tender. No masses are palpable.  There is no rebound and no guarding.  Neurological: Alert and oriented to person, place, and time. Coordination normal.  Skin: Skin is warm and dry. No rash noted. No diaphoretic. No erythema. No pallor.  the chest tube insertion site is free of erythema. There is no drainage.  Psychiatric: Normal mood and affect. Normal behavior. Judgment and thought content normal.    ASSESSMENT: Spontaneous pneumothorax left side status post left upper lobe biopsy after chest tube insertion. I have independently reviewed the patient's chest x-ray from today. There is a tiny lateral pneumothorax seen. The patient is asymptomatic.   PLAN:   He is scheduled to follow-up with oncology and radiation therapy next week for treatment of a squamous cell carcinoma. The patient does continue to smoke however he states that he mostly just lets them burned within this hands. He states he's not inhaling. We will not see him back in follow-up of would be happy to see him should the need arise.    [name], MD

## 2015-07-08 ENCOUNTER — Ambulatory Visit
Admission: RE | Admit: 2015-07-08 | Discharge: 2015-07-08 | Disposition: A | Discharge status: Home / Self Care | Payer: PPO | Source: Ambulatory Visit | Attending: Radiation Oncology | Admitting: Radiation Oncology

## 2015-07-08 ENCOUNTER — Encounter: Payer: Self-pay | Admitting: Radiation Oncology

## 2015-07-08 ENCOUNTER — Inpatient Hospital Stay: Payer: PPO | Attending: Oncology | Admitting: Oncology

## 2015-07-08 VITALS — BP 108/65 | HR 68 | Temp 96.8°F | Wt 82.0 lb

## 2015-07-08 DIAGNOSIS — J984 Other disorders of lung: Secondary | ICD-10-CM | POA: Diagnosis not present

## 2015-07-08 DIAGNOSIS — Z510 Encounter for antineoplastic radiation therapy: Secondary | ICD-10-CM | POA: Insufficient documentation

## 2015-07-08 DIAGNOSIS — C3412 Malignant neoplasm of upper lobe, left bronchus or lung: Secondary | ICD-10-CM | POA: Insufficient documentation

## 2015-07-08 DIAGNOSIS — Z8701 Personal history of pneumonia (recurrent): Secondary | ICD-10-CM | POA: Diagnosis not present

## 2015-07-08 DIAGNOSIS — Z79899 Other long term (current) drug therapy: Secondary | ICD-10-CM | POA: Insufficient documentation

## 2015-07-08 DIAGNOSIS — R918 Other nonspecific abnormal finding of lung field: Secondary | ICD-10-CM | POA: Insufficient documentation

## 2015-07-08 DIAGNOSIS — D649 Anemia, unspecified: Secondary | ICD-10-CM | POA: Insufficient documentation

## 2015-07-08 DIAGNOSIS — I700 Atherosclerosis of aorta: Secondary | ICD-10-CM | POA: Insufficient documentation

## 2015-07-08 DIAGNOSIS — J9383 Other pneumothorax: Secondary | ICD-10-CM | POA: Diagnosis not present

## 2015-07-08 DIAGNOSIS — J939 Pneumothorax, unspecified: Secondary | ICD-10-CM | POA: Diagnosis not present

## 2015-07-08 DIAGNOSIS — J431 Panlobular emphysema: Secondary | ICD-10-CM | POA: Diagnosis not present

## 2015-07-08 DIAGNOSIS — F17210 Nicotine dependence, cigarettes, uncomplicated: Secondary | ICD-10-CM | POA: Insufficient documentation

## 2015-07-08 DIAGNOSIS — J449 Chronic obstructive pulmonary disease, unspecified: Secondary | ICD-10-CM | POA: Diagnosis not present

## 2015-07-08 DIAGNOSIS — C3490 Malignant neoplasm of unspecified part of unspecified bronchus or lung: Secondary | ICD-10-CM

## 2015-07-08 DIAGNOSIS — Z7982 Long term (current) use of aspirin: Secondary | ICD-10-CM

## 2015-07-08 NOTE — Consult Note (Signed)
Except an outstanding is perfect of Radiation Oncology NEW PATIENT EVALUATION  Name: Rick Clark  MRN: [id number]  Date:   07/08/2015     DOB: September 17, 1945   This 70 y.o. 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  REFERRING PHYSICIAN: [name], MD  CHIEF COMPLAINT: No chief complaint on file.   DIAGNOSIS: The encounter diagnosis was Malignant neoplasm of upper lobe of left lung ([place]).   PREVIOUS INVESTIGATIONS:  Pathology report reviewed CT scans reviewed. PET scan to be performed this week which will be reviewed Clinical notes reviewed Case presented at weekly tumor conference  HPI: Patient is a 70 year old male whose been followed for pneumonia with serial CT scans. He was noted on recent CT scan to have a left upper lobe spiculated nodule consistent with malignancy. Follow-up at this showed he had a spontaneous pneumothorax. This necessitated a chest tube placement by Dr. Faith Rogue which at the same time he had biopsy of the left upper lobe nodule which was positive for squamous cell carcinoma. He does have significant COPD does have dyspnea on exertion and shortness of breath. Patient is status post right upper lobectomy 20-25 years ago at [facility] for lung cancer. Patient has recovered nicely his case was presented at our weekly tumor conference based on her significant COPD prior lung surgery surgical intervention was not thought to be possible. I'm seeing the patient today for consideration of SB RT. Patient does have [place] PET CT scan ordered for this week which I will review independently prior to CT simulation  PLANNED TREATMENT REGIMEN: SB RT the left upper lobe  PAST MEDICAL HISTORY:  has a past medical history of Pneumonia (Nov '15); COPD (chronic obstructive pulmonary disease) ([place]); Shortness of breath dyspnea; Lung cancer ([place]) (1990); and Lung mass.    PAST SURGICAL HISTORY:  Past  Surgical History  Procedure Laterality Date  . Lung lobectomy Right     Right upper lobe  . Flexible bronchoscopy Bilateral 09/18/2014    Procedure: FLEXIBLE BRONCHOSCOPY;  Surgeon: Allyne Gee, MD;  Location: [facility] ORS;  Service: Pulmonary;  Laterality: Bilateral;    FAMILY HISTORY: family history includes Coronary artery disease in his father; Diabetes in his mother.  SOCIAL HISTORY:  reports that he has been smoking Cigarettes.  He has a 27.5 pack-year smoking history. He has never used smokeless tobacco. He reports that he does not drink alcohol or use illicit drugs.  ALLERGIES: Review of patient's allergies indicates no known allergies.  MEDICATIONS:  Current Outpatient Prescriptions  Medication Sig Dispense Refill  . albuterol (PROVENTIL HFA;VENTOLIN HFA) 108 (90 BASE) MCG/ACT inhaler Inhale 1 puff into the lungs every 6 (six) hours as needed for wheezing or shortness of breath.    Marland Kitchen aspirin 81 MG chewable tablet Chew 81 mg by mouth every morning.    . budesonide-formoterol (SYMBICORT) 160-4.5 MCG/ACT inhaler Inhale 2 puffs into the lungs 2 (two) times daily.    . clotrimazole-betamethasone (LOTRISONE) cream Apply 1 application topically 2 (two) times daily.    . tamsulosin (FLOMAX) 0.4 MG CAPS capsule Take 1 capsule by mouth daily. Reported on 07/08/2015    . tiotropium (SPIRIVA) 18 MCG inhalation capsule Place 18 mcg into inhaler and inhale daily.     No current facility-administered medications for this encounter.    ECOG PERFORMANCE STATUS:  0 - Asymptomatic  REVIEW OF SYSTEMS:  Patient denies any weight loss,  fatigue, weakness, fever, chills or night sweats. Patient denies any loss of vision, blurred vision. Patient denies any ringing  of the ears or hearing loss. No irregular heartbeat. Patient denies heart murmur or history of fainting. Patient denies any chest pain or pain radiating to her upper extremities. Patient denies any shortness of breath, difficulty breathing at  night, cough or hemoptysis. Patient denies any swelling in the lower legs. Patient denies any nausea vomiting, vomiting of blood, or coffee ground material in the vomitus. Patient denies any stomach pain. Patient states has had normal bowel movements no significant constipation or diarrhea. Patient denies any dysuria, hematuria or significant nocturia. Patient denies any problems walking, swelling in the joints or loss of balance. Patient denies any skin changes, loss of hair or loss of weight. Patient denies any excessive worrying or anxiety or significant depression. Patient denies any problems with insomnia. Patient denies excessive thirst, polyuria, polydipsia. Patient denies any swollen glands, patient denies easy bruising or easy bleeding. Patient denies any recent infections, allergies or URI. Patient "s visual fields have not changed significantly in recent time.    PHYSICAL EXAM: There were no vitals taken for this visit. Thin slight cachectic male in NAD. No cervical or supra clavicular adenopathy is identified. Well-developed well-nourished patient in NAD. HEENT reveals PERLA, EOMI, discs not visualized.  Oral cavity is clear. No oral mucosal lesions are identified. Neck is clear without evidence of cervical or supraclavicular adenopathy. Lungs are clear to A&P. Cardiac examination is essentially unremarkable with regular rate and rhythm without murmur rub or thrill. Abdomen is benign with no organomegaly or masses noted. Motor sensory and DTR levels are equal and symmetric in the upper and lower extremities. Cranial nerves II through XII are grossly intact. Proprioception is intact. No peripheral adenopathy or edema is identified. No motor or sensory levels are noted. Crude visual fields are within normal range.  LABORATORY DATA: Pathology reports reviewed    RADIOLOGY RESULTS: CT scans reviewed PET CT scan to be reviewed reviewed independently once made available   IMPRESSION: T1 squamous  cell carcinoma of the left upper lobe in 70 year old male with significant COPD prior lung surgery and recent spontaneous pneumothorax for SB RT  PLAN: At this time I recommended SB RT to his left upper lobe. I would plan on delivering 5000 cGy in 5 fractions using motion guidance at the time of CT simulation. Risks and benefits of treatment including possible development of cough fatigue damage to some limited normal lung skin reaction possible radiation esophagitis all were discussed in detail with the patient and his family. They all seem to comprehend my treatment plan well. I have personally ordered CT simulation.  I would like to take this opportunity to thank you for allowing me to participate in the care of your patient.Armstead Peaks., MD

## 2015-07-08 NOTE — Progress Notes (Signed)
Patient ambulates without assistance, brought to exam room 8, accompanied by family.  Patient denies pain or discomfort, vitals documented, medication record updated, information provided by patient.

## 2015-07-09 NOTE — Progress Notes (Signed)
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  INTERVAL HISTORY: Patient returns to clinic today for further evaluation, hospital follow-up, and treatment planning. Patient noted to have a spontaneous pneumothorax and was recently admitted to the hospital for chest tube placement. While he had a chest tube in CT-guided biopsy was performed confirming squamous cell carcinoma of his left upper lobe. Currently, patient feels well and is back to his baseline. He has no neurologic complaints. He denies any fevers. He has a good appetite and denies weight loss. She continues to have a persistent cough, but denies chest pain, shortness of breath, or hemoptysis. He denies any nausea, vomiting, constipation, or diarrhea. He has no urinary complaints. Patient offers no further specific complaints today.  REVIEW OF SYSTEMS:   Review of Systems  Constitutional: Negative for fever, weight loss and malaise/fatigue.  HENT: Negative.   Respiratory: Positive for cough. Negative for hemoptysis and shortness of breath.   Cardiovascular: Negative.  Negative for chest pain.  Gastrointestinal: Negative.  Negative for abdominal pain.  Genitourinary: Negative.   Musculoskeletal: Negative.   Neurological: Negative.  Negative for weakness.  Psychiatric/Behavioral: Negative.     As per HPI. Otherwise, a complete review of systems is negatve.  PAST MEDICAL HISTORY: Past Medical History  Diagnosis Date  . Pneumonia Nov '15  . COPD (chronic obstructive pulmonary disease) ([place])   . Shortness of breath dyspnea   . Lung cancer [facility]) 1990    Patient states RUL Thoracotomy.  . Lung mass     new spiculated LUL lung mass    PAST SURGICAL HISTORY: Past Surgical  History  Procedure Laterality Date  . Lung lobectomy Right     Right upper lobe  . Flexible bronchoscopy Bilateral 09/18/2014    Procedure: FLEXIBLE BRONCHOSCOPY;  Surgeon: Allyne Gee, MD;  Location: [facility] ORS;  Service: Pulmonary;  Laterality: Bilateral;    FAMILY HISTORY Family History  Problem Relation Age of Onset  . Coronary artery disease Father   . Diabetes Mother        ADVANCED DIRECTIVES:    HEALTH MAINTENANCE: Social History  Substance Use Topics  . Smoking status: Current Every Day Smoker -- 0.50 packs/day for 55 years    Types: Cigarettes  . Smokeless tobacco: Never Used  . Alcohol Use: No     Colonoscopy:  PAP:  Bone density:  Lipid panel:  No Known Allergies  Current Outpatient Prescriptions  Medication Sig Dispense Refill  . albuterol (PROVENTIL HFA;VENTOLIN HFA) 108 (90 BASE) MCG/ACT inhaler Inhale 1 puff into the lungs every 6 (six) hours as needed for wheezing or shortness of breath.    Marland Kitchen aspirin 81 MG chewable tablet Chew 81 mg by mouth every morning.    . budesonide-formoterol (SYMBICORT) 160-4.5 MCG/ACT inhaler Inhale 2 puffs into the lungs 2 (two) times daily.    . clotrimazole-betamethasone (LOTRISONE) cream Apply 1 application topically 2 (two) times daily.    Marland Kitchen tiotropium (SPIRIVA) 18 MCG inhalation capsule Place 18 mcg into inhaler and inhale daily.    . tamsulosin (FLOMAX) 0.4 MG CAPS capsule Take 1 capsule by mouth daily. Reported on 07/08/2015     No current facility-administered medications for this visit.    OBJECTIVE: Filed Vitals:   07/08/15 4656  BP: 108/65  Pulse: 68  Temp: 96.8 F (36 C)     Body mass index is 13.65 kg/(m^2).    ECOG FS:0 - Asymptomatic  General: Thin, no acute distress. Eyes: Pink conjunctiva, anicteric sclera. Lungs: Clear to auscultation bilaterally. Heart: Regular rate and rhythm. No rubs, murmurs, or gallops. Abdomen: Soft, nontender, nondistended. No organomegaly noted, normoactive bowel  sounds. Musculoskeletal: No edema, cyanosis, or clubbing. Neuro: Alert, answering all questions appropriately. Cranial nerves grossly intact. Skin: No rashes or petechiae noted. Psych: Normal affect.   LAB RESULTS:  Lab Results  Component Value Date   NA 135 06/25/2015   K 3.8 06/26/2015   CL 100* 06/25/2015   CO2 30 06/25/2015   GLUCOSE 100* 06/25/2015   BUN 7 06/25/2015   CREATININE 0.49* 06/25/2015   CALCIUM 8.0* 06/25/2015   PROT 6.5 06/25/2015   ALBUMIN 2.4* 06/25/2015   AST 16 06/25/2015   ALT 12* 06/25/2015   ALKPHOS 81 06/25/2015   BILITOT 0.3 06/25/2015   GFRNONAA >60 06/25/2015   GFRAA >60 06/25/2015    Lab Results  Component Value Date   WBC 11.7* 06/26/2015   NEUTROABS 8.0* 06/26/2015   HGB 10.9* 06/26/2015   HCT 34.6* 06/26/2015   MCV 79.1* 06/26/2015   PLT 359 06/26/2015     STUDIES: Dg Chest 2 View  07/05/2015  CLINICAL DATA:  Status post chest tube removal 1 week ago ; known lung malignancy, bullous emphysema. EXAM: CHEST  2 VIEW COMPARISON:  PA and lateral chest x-ray and chest CT scan of June 28, 2015, and portable chest x-ray of June 25, 2015. FINDINGS: The lungs remain hyperinflated. There is a tiny pleural line noted over the left upper hemi thorax consistent with a less than 10% pneumothorax. Stable bullous changes are noted in the left pulmonary apex. There is no pleural effusion or mediastinal shift. There is patchy interstitial density in the left suprahilar region today more conspicuous than on the previous study. On the right there is persistent increased parenchymal density in the mid and lower lung which is related to posterior fibrotic changes and some pleural thickening. There is no pneumothorax on the right. There surgical clips in the right hilar region. The heart is normal in size. There is calcification of the aortic arch. The mediastinum is normal in width. IMPRESSION: 1. Less than 5% pneumothorax superior laterally on the left. 2.  Increased interstitial density in the left suprahilar region since the previous study. Stable bullous changes in the left apex. 3. Stable to slightly decreased conspicuity of fibrotic changes posteriorly in the right lower lung and adjacent pleural surface. 4. Aortic atherosclerosis. 5. These results will be called to the ordering clinician or representative by the Radiologist Assistant, and communication documented in the PACS or zVision Dashboard. Electronically Signed   By: David  Martinique M.D.   On: 07/05/2015 09:30   Dg Chest 2 View  06/28/2015  CLINICAL DATA:  Lung carcinoma with chest tube on left side EXAM: CHEST  2 VIEW COMPARISON:  Chest radiograph June 25, 2015; chest CT June 24, 2015 FINDINGS: Chest tube remains on the left. There are bullae in the left apex but no demonstrable pneumothorax appreciable currently. A nodular lesion in the lateral left apex remains, better demonstrated by CT. There is extensive airspace consolidation throughout portions of the lateral right mid and lower lung zones, stable. There is patchy infiltrate in the left base, stable. No new opacity is evident. Heart size is normal. There is postoperative change  in the right hilar region. The pulmonary vascularity is stable, reflecting a degree of underlying emphysematous change. No adenopathy is evident by radiography. IMPRESSION: Areas of airspace consolidation bilaterally, more on the right than on the left, stable. Underlying emphysema. Bullous disease in the left apex is noted, but no pneumothorax is apparent. Chest tube on the left is unchanged. Nodular lesion in the left upper lobe near the apex is present but better delineated by recent chest CT. Stable cardiac silhouette. Electronically Signed   By: Lowella Grip III M.D.   On: 06/28/2015 08:10   Dg Chest 2 View  06/24/2015  CLINICAL DATA:  Left-sided pneumothorax on CT performed today. History of thoracotomy for lung cancer. Patient denies shortness of breath.  EXAM: CHEST  2 VIEW COMPARISON:  CT today and 05/19/2015.  Radiographs 05/19/2015. FINDINGS: The heart size and mediastinal contours are stable. The left apical pneumothorax is unchanged from the CT performed earlier today. There is no mediastinal shift. There are stable bibasilar airspace opacities with volume loss. No significant pleural effusion demonstrated. IMPRESSION: Stable small to moderate left-sided pneumothorax from CT performed earlier today. No evidence of tension component. Electronically Signed   By: Richardean Sale M.D.   On: 06/24/2015 12:30   Ct Chest W Contrast  06/24/2015  CLINICAL DATA:  Lung cancer, cough for 1 week, left upper lobe mass. EXAM: CT CHEST WITH CONTRAST TECHNIQUE: Multidetector CT imaging of the chest was performed during intravenous contrast administration. CONTRAST:  18m ISOVUE-300 IOPAMIDOL (ISOVUE-300) INJECTION 61% COMPARISON:  05/19/2015. FINDINGS: Mediastinum/Nodes: No pathologically enlarged mediastinal, hilar or axillary lymph nodes. Atherosclerotic calcification of the arterial vasculature, including coronary arteries. Heart size normal. Small amount of pericardial fluid appears slightly increased from 05/19/2015. Lungs/Pleura: Spiculated left upper lobe nodule measures 1.3 x 1.6 cm, previously 0.9 x 1.4 cm. Small to moderate left pneumothorax, new. Severe centrilobular and paraseptal emphysema. Peribronchovascular nodularity in the left lower lobe is new. Collapse/ consolidation in the left lower lobe persists. Spiculated masslike consolidation in the right lower lobe, slightly improved. Trace right pleural effusion with pleural thickening. Debris is seen dependently in the lower trachea and both mainstem bronchi. Upper abdomen: Numerous sub cm low-attenuation lesions are again seen in the liver and are too small to characterize. Biliary hamartomas can have this appearance. Right adrenal gland is unremarkable. There may be left adrenal thickening, stable. Visualized  portions of the kidneys, spleen, pancreas, stomach and bowel are grossly unremarkable. Musculoskeletal: No worrisome lytic or sclerotic lesions. IMPRESSION: 1. New small to moderate left pneumothorax, possibly due to bleb rupture. Critical Value/emergent results were called by telephone at the time of interpretation on 06/24/2015 at 10:41 am to [place] , who verbally acknowledged these results. 2. Spiculated left upper lobe nodule, stable to minimally larger than on 05/19/2015. 3. Spiculated masslike consolidation in the right lower lobe, slightly improved, suggesting resolving pneumonia. 4. Persistent left lower lobe collapse/consolidation. 5. Small amount of pericardial fluid appears slightly increased from 05/19/2015. 6. Ill-defined peribronchovascular nodularity in the left lower lobe is new and most indicative of an infectious bronchiolitis. Electronically Signed   By: MLorin PicketM.D.   On: 06/24/2015 10:41   Ct Biopsy  06/26/2015  INDICATION: Left upper lobe lung mass EXAM: CT GUIDED Clark BIOPSY OF LEFT UPPER LOBE LUNG MASS MEDICATIONS: None. ANESTHESIA/SEDATION: Fentanyl 25 mcg IV The patient was continuously monitored during the procedure by the interventional radiology nurse under my direct supervision. FLUOROSCOPY TIME:  Not applicable COMPLICATIONS: Some mild post  biopsy parenchymal hemorrhage was noted. No pneumothorax was seen. PROCEDURE: Informed written consent was obtained from the patient after a thorough discussion of the procedural risks, benefits and alternatives. All questions were addressed. Maximal Sterile Barrier Technique was utilized including caps, mask, sterile gowns, sterile gloves, sterile drape, hand hygiene and skin antiseptic. A timeout was performed prior to the initiation of the procedure. The left posterior chest wall was prepped with Chlorhexidine in a sterile fashion, and a sterile drape was applied covering the operative field. A sterile gown and sterile gloves  were used for the procedure. Local anesthesia was provided with 1% Lidocaine. Utilizing CT fluoroscopic guidance, a 75 gauge guiding needle was placed into the left upper lobe adjacent to the known mass lesion. Multiple 18 gauge Clark biopsies were then obtained. These were sent for pathologic evaluation. The guiding needle was then removed and Gel-Foam placed to aid in prevention of pneumothorax. Followup scanning shows no significant pneumothorax. Some mild parenchymal hemorrhage was noted. The patient tolerated the procedure well was returned to his room in satisfactory condition. IMPRESSION: Successful CT-guided left upper lobe lung biopsy. Electronically Signed   By: Inez Catalina M.D.   On: 06/26/2015 12:39   Dg Chest Port 1 View  06/25/2015  CLINICAL DATA:  Pneumothorax.  Lung cancer.  Chest tube. EXAM: PORTABLE CHEST 1 VIEW COMPARISON:  06/24/2015 FINDINGS: Pigtail catheter in the left chest cavity is unchanged. No pneumothorax. Left apical blebs noted. Left lower lobe consolidation slightly progressive. Extensive right lower lobe airspace disease also progressive since the prior study. No pleural effusion Severe COPD with apical scarring bilaterally. IMPRESSION: Left chest tube remains in place.  No pneumothorax on the left Progression of bilateral airspace disease which may represent collapse and/or pneumonia. Electronically Signed   By: Franchot Gallo M.D.   On: 06/25/2015 07:39   Dg Chest Portable 1 View  06/24/2015  CLINICAL DATA:  Left-sided chest tube placement EXAM: PORTABLE CHEST 1 VIEW COMPARISON:  Chest radiograph and chest CT obtained earlier in the day FINDINGS: Chest tube has been placed on the left with resolution of left-sided pneumothorax. Currently no pneumothorax is seen on either side. There is bullous disease in the left apex, stable. There is postoperative change on the right with volume loss and considerable scarring, stable. There is stable consolidation in the right base. The  mass lesion in the left upper lobe appears to slightly medial to the chest tube and is not well demonstrated. There is airspace consolidation in the medial left base, stable. Heart size is normal. The pulmonary vascularity is stable with postoperative change on the right. Pulmonary vascularity is is normal on the left. No adenopathy is seen. No bone lesions are evident. IMPRESSION: Resolution of pneumothorax on the left with chest tube placement. Areas of consolidation in both lower lobes. Scarring on the right is stable with postoperative change. Nodular lesion left upper lobe is not well seen but is present medial to the chest tube. No new opacity. No change in cardiac silhouette. Electronically Signed   By: Lowella Grip III M.D.   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  PLAN:    1. Lung cancer: Patient is not a surgical candidate. Given the stage of his disease, he does not require chemotherapy at this time although given patient's comorbidities and lung function, he may not be able to tolerate chemotherapy in the future. He has consultation with radiation oncology today  for consideration of SBRT to his left upper lobe lesion. We will get PET scan prior to XRT to complete staging workup. Patient will return to clinic in 3 weeks after the completion of his XRT for further evaluation. 2. Right lower lobe lesion: Slightly improved on most recent CT scan, but still highly suspicious for malignancy. Will continue to monitor with routine imaging. 3. Anemia: Mild, monitor.    Patient expressed understanding and was in agreement with this plan. He also understands that He can call clinic at any time with any questions, concerns, or complaints.    Lloyd Huger, MD   07/09/2015 12:15 AM

## 2015-07-10 ENCOUNTER — Ambulatory Visit: Payer: PPO

## 2015-07-11 ENCOUNTER — Ambulatory Visit: Payer: PPO

## 2015-07-12 ENCOUNTER — Ambulatory Visit
Admission: RE | Admit: 2015-07-12 | Discharge: 2015-07-12 | Disposition: A | Discharge status: Home / Self Care | Payer: PPO | Source: Ambulatory Visit | Attending: Oncology | Admitting: Oncology

## 2015-07-12 DIAGNOSIS — R911 Solitary pulmonary nodule: Secondary | ICD-10-CM | POA: Insufficient documentation

## 2015-07-12 DIAGNOSIS — R918 Other nonspecific abnormal finding of lung field: Secondary | ICD-10-CM | POA: Insufficient documentation

## 2015-07-12 DIAGNOSIS — J479 Bronchiectasis, uncomplicated: Secondary | ICD-10-CM | POA: Insufficient documentation

## 2015-07-12 DIAGNOSIS — C3490 Malignant neoplasm of unspecified part of unspecified bronchus or lung: Secondary | ICD-10-CM | POA: Insufficient documentation

## 2015-07-12 DIAGNOSIS — C3492 Malignant neoplasm of unspecified part of left bronchus or lung: Secondary | ICD-10-CM | POA: Diagnosis not present

## 2015-07-12 LAB — GLUCOSE, CAPILLARY: Glucose-Capillary: 77 mg/dL (ref 65–99)

## 2015-07-12 MED ORDER — FLUDEOXYGLUCOSE F - 18 (FDG) INJECTION
12.0200 | Freq: Once | INTRAVENOUS | Status: AC | PRN
  Administered 2015-07-12: 12.02 via INTRAVENOUS

## 2015-07-24 ENCOUNTER — Ambulatory Visit
Admission: RE | Admit: 2015-07-24 | Discharge: 2015-07-24 | Disposition: A | Discharge status: Home / Self Care | Payer: PPO | Source: Ambulatory Visit | Attending: Radiation Oncology | Admitting: Radiation Oncology

## 2015-07-24 DIAGNOSIS — C3412 Malignant neoplasm of upper lobe, left bronchus or lung: Secondary | ICD-10-CM | POA: Diagnosis not present

## 2015-07-24 DIAGNOSIS — Z510 Encounter for antineoplastic radiation therapy: Secondary | ICD-10-CM | POA: Diagnosis not present

## 2015-07-26 DIAGNOSIS — Z510 Encounter for antineoplastic radiation therapy: Secondary | ICD-10-CM | POA: Diagnosis not present

## 2015-07-26 DIAGNOSIS — C3412 Malignant neoplasm of upper lobe, left bronchus or lung: Secondary | ICD-10-CM | POA: Diagnosis not present

## 2015-07-29 ENCOUNTER — Inpatient Hospital Stay: Payer: PPO | Attending: Oncology | Admitting: Oncology

## 2015-07-29 ENCOUNTER — Inpatient Hospital Stay: Payer: PPO

## 2015-07-29 ENCOUNTER — Ambulatory Visit: Payer: PPO

## 2015-07-29 VITALS — BP 111/62 | HR 73 | Temp 95.0°F | Resp 18 | Wt 80.5 lb

## 2015-07-29 DIAGNOSIS — Z7982 Long term (current) use of aspirin: Secondary | ICD-10-CM | POA: Insufficient documentation

## 2015-07-29 DIAGNOSIS — Z510 Encounter for antineoplastic radiation therapy: Secondary | ICD-10-CM | POA: Diagnosis not present

## 2015-07-29 DIAGNOSIS — R918 Other nonspecific abnormal finding of lung field: Secondary | ICD-10-CM | POA: Insufficient documentation

## 2015-07-29 DIAGNOSIS — Z79899 Other long term (current) drug therapy: Secondary | ICD-10-CM | POA: Diagnosis not present

## 2015-07-29 DIAGNOSIS — J449 Chronic obstructive pulmonary disease, unspecified: Secondary | ICD-10-CM | POA: Diagnosis not present

## 2015-07-29 DIAGNOSIS — Z8701 Personal history of pneumonia (recurrent): Secondary | ICD-10-CM

## 2015-07-29 DIAGNOSIS — D649 Anemia, unspecified: Secondary | ICD-10-CM | POA: Diagnosis not present

## 2015-07-29 DIAGNOSIS — C3412 Malignant neoplasm of upper lobe, left bronchus or lung: Secondary | ICD-10-CM

## 2015-07-29 DIAGNOSIS — F17210 Nicotine dependence, cigarettes, uncomplicated: Secondary | ICD-10-CM | POA: Diagnosis not present

## 2015-07-29 DIAGNOSIS — C3490 Malignant neoplasm of unspecified part of unspecified bronchus or lung: Secondary | ICD-10-CM

## 2015-07-29 LAB — CBC WITH DIFFERENTIAL/PLATELET
Basophils Absolute: 0.1 10*3/uL (ref 0–0.1)
Basophils Relative: 1 %
EOS ABS: 0.4 10*3/uL (ref 0–0.7)
EOS PCT: 3 %
HCT: 38.2 % — ABNORMAL LOW (ref 40.0–52.0)
Hemoglobin: 12.5 g/dL — ABNORMAL LOW (ref 13.0–18.0)
LYMPHS ABS: 3.8 10*3/uL — AB (ref 1.0–3.6)
LYMPHS PCT: 32 %
MCH: 25.2 pg — AB (ref 26.0–34.0)
MCHC: 32.6 g/dL (ref 32.0–36.0)
MCV: 77.4 fL — AB (ref 80.0–100.0)
MONO ABS: 0.8 10*3/uL (ref 0.2–1.0)
MONOS PCT: 7 %
Neutro Abs: 6.8 10*3/uL — ABNORMAL HIGH (ref 1.4–6.5)
Neutrophils Relative %: 57 %
PLATELETS: 443 10*3/uL — AB (ref 150–440)
RBC: 4.94 MIL/uL (ref 4.40–5.90)
RDW: 16 % — ABNORMAL HIGH (ref 11.5–14.5)
WBC: 11.7 10*3/uL — AB (ref 3.8–10.6)

## 2015-07-29 NOTE — Progress Notes (Signed)
Offers no complaints  

## 2015-07-30 ENCOUNTER — Ambulatory Visit: Payer: PPO

## 2015-07-31 ENCOUNTER — Ambulatory Visit: Payer: PPO

## 2015-08-01 ENCOUNTER — Ambulatory Visit: Payer: PPO

## 2015-08-05 ENCOUNTER — Ambulatory Visit: Payer: PPO

## 2015-08-05 DIAGNOSIS — C3412 Malignant neoplasm of upper lobe, left bronchus or lung: Secondary | ICD-10-CM | POA: Insufficient documentation

## 2015-08-05 NOTE — Progress Notes (Signed)
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  INTERVAL HISTORY: Patient returns to clinic today for further evaluation. He is now completed his XRT to his left upper lobe and tolerated it well. He currently feels well and is at his baseline. He has no neurologic complaints. He denies any fevers. He has a good appetite and denies weight loss. She continues to have a persistent cough, but denies chest pain, shortness of breath, or hemoptysis. He denies any nausea, vomiting, constipation, or diarrhea. He has no urinary complaints. Patient offers no further specific complaints today.  REVIEW OF SYSTEMS:   Review of Systems  Constitutional: Negative for fever, malaise/fatigue and weight loss.  HENT: Negative.   Respiratory: Positive for cough. Negative for hemoptysis and shortness of breath.   Cardiovascular: Negative.  Negative for chest pain.  Gastrointestinal: Negative.  Negative for abdominal pain.  Genitourinary: Negative.   Musculoskeletal: Negative.   Neurological: Negative.  Negative for weakness.  Psychiatric/Behavioral: Negative.     As per HPI. Otherwise, a complete review of systems is negatve.  PAST MEDICAL HISTORY: Past Medical History:  Diagnosis Date  . COPD (chronic obstructive pulmonary disease) ([place])   . Lung cancer [facility]) 1990   Patient states RUL Thoracotomy.  . Lung mass    new spiculated LUL lung mass  . Pneumonia Nov '15  . Shortness of breath dyspnea     PAST SURGICAL HISTORY: Past Surgical History:  Procedure Laterality Date  . FLEXIBLE BRONCHOSCOPY Bilateral 09/18/2014   Procedure: FLEXIBLE BRONCHOSCOPY;  Surgeon: Allyne Gee, MD;  Location: [facility] ORS;  Service: Pulmonary;  Laterality: Bilateral;  .  LUNG LOBECTOMY Right    Right upper lobe    FAMILY HISTORY Family History  Problem Relation Age of Onset  . Coronary artery disease Father   . Diabetes Mother        ADVANCED DIRECTIVES:    HEALTH MAINTENANCE: Social History  Substance Use Topics  . Smoking status: Current Every Day Smoker    Packs/day: 0.50    Years: 55.00    Types: Cigarettes  . Smokeless tobacco: Never Used  . Alcohol use No     Colonoscopy:  PAP:  Bone density:  Lipid panel:  No Known Allergies  Current Outpatient Prescriptions  Medication Sig Dispense Refill  . albuterol (PROVENTIL HFA;VENTOLIN HFA) 108 (90 BASE) MCG/ACT inhaler Inhale 1 puff into the lungs every 6 (six) hours as needed for wheezing or shortness of breath.    Marland Kitchen aspirin 81 MG chewable tablet Chew 81 mg by mouth every morning.    . budesonide-formoterol (SYMBICORT) 160-4.5 MCG/ACT inhaler Inhale 2 puffs into the lungs 2 (two) times daily.    . clotrimazole-betamethasone (LOTRISONE) cream Apply 1 application topically 2 (two) times daily.    . tamsulosin (FLOMAX) 0.4 MG CAPS capsule Take 1 capsule by mouth daily. Reported on 07/08/2015    . tiotropium (SPIRIVA) 18 MCG inhalation capsule Place 18 mcg into inhaler and inhale daily.     No current facility-administered medications for this visit.     OBJECTIVE: Vitals:   07/29/15 1604  BP: 111/62  Pulse: 73  Resp: 18  Temp: (!) 95 F (35 C)     Body mass index is 13.39 kg/m.    ECOG  FS:0 - Asymptomatic  General: Thin, no acute distress. Eyes: Pink conjunctiva, anicteric sclera. Lungs: Clear to auscultation bilaterally. Heart: Regular rate and rhythm. No rubs, murmurs, or gallops. Abdomen: Soft, nontender, nondistended. No organomegaly noted, normoactive bowel sounds. Musculoskeletal: No edema, cyanosis, or clubbing. Neuro: Alert, answering all questions appropriately. Cranial nerves grossly intact. Skin: No rashes or petechiae noted. Psych: Normal affect.   LAB  RESULTS:  Lab Results  Component Value Date   NA 135 06/25/2015   K 3.8 06/26/2015   CL 100 (L) 06/25/2015   CO2 30 06/25/2015   GLUCOSE 100 (H) 06/25/2015   BUN 7 06/25/2015   CREATININE 0.49 (L) 06/25/2015   CALCIUM 8.0 (L) 06/25/2015   PROT 6.5 06/25/2015   ALBUMIN 2.4 (L) 06/25/2015   AST 16 06/25/2015   ALT 12 (L) 06/25/2015   ALKPHOS 81 06/25/2015   BILITOT 0.3 06/25/2015   GFRNONAA >60 06/25/2015   GFRAA >60 06/25/2015    Lab Results  Component Value Date   WBC 11.7 (H) 07/29/2015   NEUTROABS 6.8 (H) 07/29/2015   HGB 12.5 (L) 07/29/2015   HCT 38.2 (L) 07/29/2015   MCV 77.4 (L) 07/29/2015   PLT 443 (H) 07/29/2015     STUDIES: Nm Pet Image Initial (pi) Skull Base To Thigh  Result Date: 07/12/2015 CLINICAL DATA:  Initial treatment strategy for lung carcinoma. Squamous cell carcinoma LEFT upper lobe. EXAM: NUCLEAR MEDICINE PET SKULL BASE TO THIGH TECHNIQUE: 12.0 mCi F-18 FDG was injected intravenously. Full-ring PET imaging was performed from the skull base to thigh after the radiotracer. CT data was obtained and used for attenuation correction and anatomic localization. FASTING BLOOD GLUCOSE:  Value: 77 mg/dl COMPARISON:  PET-CT 12/18/2014, chest CT 06/24/2015 FINDINGS: NECK No hypermetabolic lymph nodes in the neck. CHEST LEFT upper lobe nodule of concern measures 13 mm ( image 57, series 3) is increased in metabolic activity with SUV max of 2.9 double from SUV max equal 1.45 on FDG PET scan 12/18/2014. There is second hypermetabolic nodule within the lingula just anterior to the fissure measuring 8 mm (image 120, series 3) with SUV max equal 3.0. There is moderate activity associated with the bronchiectasis and atelectasis medially in the LEFT lower lobe area of More intense uptake associated with the peripheral atelectasis and thickening in the RIGHT lower lobe with SUV max equal 4.2 (image 191 of the fused data set). There is mild metabolic activity associated with LEFT  lower paratracheal lymph node similar comparison exam with SUV max of 2.2. Mild precarinal activity with SUV max equal 3.0. No supraclavicular adenopathy. ABDOMEN/PELVIS No abnormal hypermetabolic activity within the liver, pancreas, adrenal glands, or spleen. No hypermetabolic lymph nodes in the abdomen or pelvis. SKELETON No focal hypermetabolic activity to suggest skeletal metastasis. IMPRESSION: 1. Clear increase in metabolic activity of LEFT upper lobe pulmonary nodule consistent with squamous cell carcinoma on biopsy. 2. Significant metabolic activity associated with a spiculated nodule in the inferior lingula is indeterminate but concerning. 3. Hypermetabolic peripheral consolidation and pleural thickening in the lateral RIGHT lower lobe is likely inflammatory but cannot exclude carcinoma. 4. Moderate uptake associated with the bronchiectasis in atelectasis LEFT is favor inflammatory. 5. No convincing evidence of mediastinal metastatic adenopathy. 1. Electronically Signed   By: Suzy Bouchard M.D.   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 Patient does not require chemotherapy at this time although given patient's comorbidities and lung function, he may not be able to tolerate chemotherapy in the future. No further intervention is needed at this time. Return to clinic in 3 months with repeat CT scan and further evaluation.  2. Right lower lobe lesion: Slightly improved on most recent CT scan, but still highly suspicious for malignancy. Will continue to monitor with routine imaging. 3. Anemia: Mild, monitor.    Patient expressed understanding and was in agreement with this plan. He also understands that He can call clinic at any time with any questions, concerns, or complaints.     Lloyd Huger, MD   08/05/2015 1:44 PM

## 2015-08-06 ENCOUNTER — Ambulatory Visit
Admission: RE | Admit: 2015-08-06 | Discharge: 2015-08-06 | Disposition: A | Discharge status: Home / Self Care | Payer: PPO | Source: Ambulatory Visit | Attending: Radiation Oncology | Admitting: Radiation Oncology

## 2015-08-06 DIAGNOSIS — Z510 Encounter for antineoplastic radiation therapy: Secondary | ICD-10-CM | POA: Diagnosis not present

## 2015-08-07 ENCOUNTER — Ambulatory Visit: Payer: PPO

## 2015-08-08 ENCOUNTER — Ambulatory Visit
Admission: RE | Admit: 2015-08-08 | Discharge: 2015-08-08 | Disposition: A | Discharge status: Home / Self Care | Payer: PPO | Source: Ambulatory Visit | Attending: Radiation Oncology | Admitting: Radiation Oncology

## 2015-08-08 ENCOUNTER — Ambulatory Visit: Payer: PPO

## 2015-08-08 DIAGNOSIS — Z510 Encounter for antineoplastic radiation therapy: Secondary | ICD-10-CM | POA: Diagnosis not present

## 2015-08-12 ENCOUNTER — Ambulatory Visit: Payer: PPO

## 2015-08-13 ENCOUNTER — Ambulatory Visit: Payer: PPO

## 2015-08-13 ENCOUNTER — Ambulatory Visit
Admission: RE | Admit: 2015-08-13 | Discharge: 2015-08-13 | Disposition: A | Discharge status: Home / Self Care | Payer: PPO | Source: Ambulatory Visit | Attending: Radiation Oncology | Admitting: Radiation Oncology

## 2015-08-13 DIAGNOSIS — Z510 Encounter for antineoplastic radiation therapy: Secondary | ICD-10-CM | POA: Diagnosis not present

## 2015-08-15 ENCOUNTER — Ambulatory Visit
Admission: RE | Admit: 2015-08-15 | Discharge: 2015-08-15 | Disposition: A | Discharge status: Home / Self Care | Payer: PPO | Source: Ambulatory Visit | Attending: Radiation Oncology | Admitting: Radiation Oncology

## 2015-08-15 DIAGNOSIS — Z510 Encounter for antineoplastic radiation therapy: Secondary | ICD-10-CM | POA: Diagnosis not present

## 2015-08-20 ENCOUNTER — Ambulatory Visit
Admission: RE | Admit: 2015-08-20 | Discharge: 2015-08-20 | Disposition: A | Discharge status: Home / Self Care | Payer: PPO | Source: Ambulatory Visit | Attending: Radiation Oncology | Admitting: Radiation Oncology

## 2015-08-20 DIAGNOSIS — Z510 Encounter for antineoplastic radiation therapy: Secondary | ICD-10-CM | POA: Diagnosis not present

## 2015-08-20 DIAGNOSIS — C3412 Malignant neoplasm of upper lobe, left bronchus or lung: Secondary | ICD-10-CM | POA: Diagnosis not present

## 2015-08-23 ENCOUNTER — Other Ambulatory Visit: Payer: PPO

## 2015-08-23 ENCOUNTER — Ambulatory Visit: Payer: PPO

## 2015-08-26 ENCOUNTER — Ambulatory Visit: Payer: PPO | Admitting: Oncology

## 2015-08-29 ENCOUNTER — Other Ambulatory Visit: Payer: PPO

## 2015-09-26 ENCOUNTER — Encounter: Payer: Self-pay | Admitting: Radiation Oncology

## 2015-09-26 ENCOUNTER — Ambulatory Visit
Admission: RE | Admit: 2015-09-26 | Discharge: 2015-09-26 | Disposition: A | Discharge status: Home / Self Care | Payer: PPO | Source: Ambulatory Visit | Attending: Radiation Oncology | Admitting: Radiation Oncology

## 2015-09-26 VITALS — BP 106/63 | HR 85 | Temp 94.4°F | Resp 20 | Wt 79.6 lb

## 2015-09-26 DIAGNOSIS — Z923 Personal history of irradiation: Secondary | ICD-10-CM | POA: Insufficient documentation

## 2015-09-26 DIAGNOSIS — C3411 Malignant neoplasm of upper lobe, right bronchus or lung: Secondary | ICD-10-CM | POA: Diagnosis not present

## 2015-09-26 DIAGNOSIS — C3412 Malignant neoplasm of upper lobe, left bronchus or lung: Secondary | ICD-10-CM

## 2015-09-26 NOTE — Progress Notes (Signed)
Radiation Oncology Follow up Note  Name: Rick Clark   Date:   09/26/2015 MRN:  [id number] DOB: 1945/03/11    This 70 y.o. 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  REFERRING PROVIDER: [name], MD  HPI: Patient is a 70 year old male now one month out having completed SB RT to his left upper lobe for a T1 N0 squamous cell carcinoma. He is seen today in routine follow-up and is doing well. He is asymptomatic he specifically denies cough hemoptysis chest tightness any dysphasia or skin reaction..  COMPLICATIONS OF TREATMENT: none  FOLLOW UP COMPLIANCE: keeps appointments   PHYSICAL EXAM:  BP 106/63   Pulse 85   Temp (!) 94.4 F (34.7 C)   Resp 20   Wt 79 lb 9.4 oz (36.1 kg)   BMI 13.24 kg/m  Thin slightly cachectic male in NAD. Well-developed well-nourished patient in NAD. HEENT reveals PERLA, EOMI, discs not visualized.  Oral cavity is clear. No oral mucosal lesions are identified. Neck is clear without evidence of cervical or supraclavicular adenopathy. Lungs are clear to A&P. Cardiac examination is essentially unremarkable with regular rate and rhythm without murmur rub or thrill. Abdomen is benign with no organomegaly or masses noted. Motor sensory and DTR levels are equal and symmetric in the upper and lower extremities. Cranial nerves II through XII are grossly intact. Proprioception is intact. No peripheral adenopathy or edema is identified. No motor or sensory levels are noted. Crude visual fields are within normal range.  RADIOLOGY RESULTS: CT scan has been ordered for October  PLAN: Present time patient is doing well without any significant side effects or complaints from his prior SB RT. He will have a CT scan in October and I will review that when it becomes available. I've asked to see him back in 3-4 months for follow-up. Patient knows to call sooner with any concerns.  I  would like to take this opportunity to thank you for allowing me to participate in the care of your patient.Armstead Peaks., MD

## 2015-09-30 DIAGNOSIS — J449 Chronic obstructive pulmonary disease, unspecified: Secondary | ICD-10-CM | POA: Diagnosis not present

## 2015-09-30 DIAGNOSIS — F17210 Nicotine dependence, cigarettes, uncomplicated: Secondary | ICD-10-CM | POA: Diagnosis not present

## 2015-09-30 DIAGNOSIS — R634 Abnormal weight loss: Secondary | ICD-10-CM | POA: Diagnosis not present

## 2015-09-30 DIAGNOSIS — C3490 Malignant neoplasm of unspecified part of unspecified bronchus or lung: Secondary | ICD-10-CM | POA: Diagnosis not present

## 2015-10-28 ENCOUNTER — Inpatient Hospital Stay: Payer: PPO | Attending: Oncology

## 2015-10-28 ENCOUNTER — Ambulatory Visit
Admission: RE | Admit: 2015-10-28 | Discharge: 2015-10-28 | Disposition: A | Discharge status: Home / Self Care | Payer: PPO | Source: Ambulatory Visit | Attending: Oncology | Admitting: Oncology

## 2015-10-28 DIAGNOSIS — J479 Bronchiectasis, uncomplicated: Secondary | ICD-10-CM | POA: Diagnosis not present

## 2015-10-28 DIAGNOSIS — R0602 Shortness of breath: Secondary | ICD-10-CM | POA: Insufficient documentation

## 2015-10-28 DIAGNOSIS — I700 Atherosclerosis of aorta: Secondary | ICD-10-CM | POA: Insufficient documentation

## 2015-10-28 DIAGNOSIS — J439 Emphysema, unspecified: Secondary | ICD-10-CM | POA: Insufficient documentation

## 2015-10-28 DIAGNOSIS — Z7982 Long term (current) use of aspirin: Secondary | ICD-10-CM | POA: Diagnosis not present

## 2015-10-28 DIAGNOSIS — Z8701 Personal history of pneumonia (recurrent): Secondary | ICD-10-CM | POA: Diagnosis not present

## 2015-10-28 DIAGNOSIS — Z87891 Personal history of nicotine dependence: Secondary | ICD-10-CM | POA: Insufficient documentation

## 2015-10-28 DIAGNOSIS — J449 Chronic obstructive pulmonary disease, unspecified: Secondary | ICD-10-CM | POA: Diagnosis not present

## 2015-10-28 DIAGNOSIS — C3412 Malignant neoplasm of upper lobe, left bronchus or lung: Secondary | ICD-10-CM | POA: Insufficient documentation

## 2015-10-28 DIAGNOSIS — R911 Solitary pulmonary nodule: Secondary | ICD-10-CM

## 2015-10-28 DIAGNOSIS — D649 Anemia, unspecified: Secondary | ICD-10-CM | POA: Insufficient documentation

## 2015-10-28 DIAGNOSIS — Z79899 Other long term (current) drug therapy: Secondary | ICD-10-CM | POA: Insufficient documentation

## 2015-10-28 DIAGNOSIS — R05 Cough: Secondary | ICD-10-CM | POA: Insufficient documentation

## 2015-10-28 DIAGNOSIS — J90 Pleural effusion, not elsewhere classified: Secondary | ICD-10-CM | POA: Insufficient documentation

## 2015-10-28 LAB — CREATININE, SERUM: CREATININE: 0.69 mg/dL (ref 0.61–1.24)

## 2015-10-28 MED ORDER — IOPAMIDOL (ISOVUE-300) INJECTION 61%
75.0000 mL | Freq: Once | INTRAVENOUS | Status: AC | PRN
  Administered 2015-10-28: 75 mL via INTRAVENOUS

## 2015-10-30 ENCOUNTER — Inpatient Hospital Stay (HOSPITAL_BASED_OUTPATIENT_CLINIC_OR_DEPARTMENT_OTHER): Payer: PPO | Admitting: Oncology

## 2015-10-30 VITALS — BP 125/68 | HR 74 | Temp 96.5°F | Resp 18 | Wt 81.3 lb

## 2015-10-30 DIAGNOSIS — J449 Chronic obstructive pulmonary disease, unspecified: Secondary | ICD-10-CM

## 2015-10-30 DIAGNOSIS — J90 Pleural effusion, not elsewhere classified: Secondary | ICD-10-CM

## 2015-10-30 DIAGNOSIS — Z8701 Personal history of pneumonia (recurrent): Secondary | ICD-10-CM

## 2015-10-30 DIAGNOSIS — C3412 Malignant neoplasm of upper lobe, left bronchus or lung: Secondary | ICD-10-CM

## 2015-10-30 DIAGNOSIS — J479 Bronchiectasis, uncomplicated: Secondary | ICD-10-CM

## 2015-10-30 DIAGNOSIS — R0602 Shortness of breath: Secondary | ICD-10-CM

## 2015-10-30 DIAGNOSIS — Z79899 Other long term (current) drug therapy: Secondary | ICD-10-CM

## 2015-10-30 DIAGNOSIS — D649 Anemia, unspecified: Secondary | ICD-10-CM

## 2015-10-30 DIAGNOSIS — R05 Cough: Secondary | ICD-10-CM

## 2015-10-30 DIAGNOSIS — I700 Atherosclerosis of aorta: Secondary | ICD-10-CM

## 2015-10-30 DIAGNOSIS — Z7982 Long term (current) use of aspirin: Secondary | ICD-10-CM

## 2015-10-30 DIAGNOSIS — Z87891 Personal history of nicotine dependence: Secondary | ICD-10-CM

## 2015-10-30 NOTE — Progress Notes (Signed)
States is feeling well. Offers no complaints. 

## 2015-10-30 NOTE — Progress Notes (Signed)
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  INTERVAL HISTORY: Patient returns to clinic today for further evaluation and discussion of his imaging results. He currently feels well and is at his baseline. He has no neurologic complaints. He denies any fevers. He has a good appetite and denies weight loss. She continues to have a persistent cough, but denies chest pain, shortness of breath, or hemoptysis. He denies any nausea, vomiting, constipation, or diarrhea. He has no urinary complaints. Patient offers no further specific complaints today.  REVIEW OF SYSTEMS:   Review of Systems  Constitutional: Negative for fever, malaise/fatigue and weight loss.  HENT: Negative.   Respiratory: Positive for cough. Negative for hemoptysis and shortness of breath.   Cardiovascular: Negative.  Negative for chest pain.  Gastrointestinal: Negative.  Negative for abdominal pain.  Genitourinary: Negative.   Musculoskeletal: Negative.   Neurological: Negative.  Negative for weakness.  Psychiatric/Behavioral: Negative.     As per HPI. Otherwise, a complete review of systems is negative.  PAST MEDICAL HISTORY: Past Medical History:  Diagnosis Date  . COPD (chronic obstructive pulmonary disease) ([place])   . Lung cancer [facility]) 1990   Patient states RUL Thoracotomy.  . Lung mass    new spiculated LUL lung mass  . Pneumonia Nov '15  . Shortness of breath dyspnea     PAST SURGICAL HISTORY: Past Surgical History:  Procedure Laterality Date  . FLEXIBLE BRONCHOSCOPY Bilateral 09/18/2014   Procedure: FLEXIBLE BRONCHOSCOPY;  Surgeon: Rick Gee, MD;  Location: [facility] ORS;  Service: Pulmonary;  Laterality: Bilateral;  . LUNG LOBECTOMY Right    Right  upper lobe    FAMILY HISTORY Family History  Problem Relation Age of Onset  . Coronary artery disease Father   . Diabetes Mother        ADVANCED DIRECTIVES:    HEALTH MAINTENANCE: Social History  Substance Use Topics  . Smoking status: Current Every Day Smoker    Packs/day: 0.50    Years: 55.00    Types: Cigarettes  . Smokeless tobacco: Never Used  . Alcohol use No     Colonoscopy:  PAP:  Bone density:  Lipid panel:  No Known Allergies  Current Outpatient Prescriptions  Medication Sig Dispense Refill  . albuterol (PROVENTIL HFA;VENTOLIN HFA) 108 (90 BASE) MCG/ACT inhaler Inhale 1 puff into the lungs every 6 (six) hours as needed for wheezing or shortness of breath.    Marland Kitchen aspirin 81 MG chewable tablet Chew 81 mg by mouth every morning.    . budesonide-formoterol (SYMBICORT) 160-4.5 MCG/ACT inhaler Inhale 2 puffs into the lungs 2 (two) times daily.    Marland Kitchen tiotropium (SPIRIVA) 18 MCG inhalation capsule Place 18 mcg into inhaler and inhale daily.     No current facility-administered medications for this visit.     OBJECTIVE: Vitals:   10/30/15 1051  BP: 125/68  Pulse: 74  Resp: 18  Temp: (!) 96.5 F (35.8 C)     Body mass index is 13.54 kg/m.    ECOG FS:0 - Asymptomatic  General: Thin, no acute distress. Eyes: Pink conjunctiva, anicteric sclera. Lungs: Clear to auscultation bilaterally. Heart: Regular rate and rhythm. No rubs, murmurs, or gallops. Abdomen: Soft, nontender, nondistended. No organomegaly noted, normoactive bowel sounds. Musculoskeletal: No edema, cyanosis,  or clubbing. Neuro: Alert, answering all questions appropriately. Cranial nerves grossly intact. Skin: No rashes or petechiae noted. Psych: Normal affect.   LAB RESULTS:  Lab Results  Component Value Date   NA 135 06/25/2015   K 3.8 06/26/2015   CL 100 (L) 06/25/2015   CO2 30 06/25/2015   GLUCOSE 100 (H) 06/25/2015   BUN 7 06/25/2015   CREATININE 0.69 10/28/2015   CALCIUM 8.0 (L)  06/25/2015   PROT 6.5 06/25/2015   ALBUMIN 2.4 (L) 06/25/2015   AST 16 06/25/2015   ALT 12 (L) 06/25/2015   ALKPHOS 81 06/25/2015   BILITOT 0.3 06/25/2015   GFRNONAA >60 10/28/2015   GFRAA >60 10/28/2015    Lab Results  Component Value Date   WBC 11.7 (H) 07/29/2015   NEUTROABS 6.8 (H) 07/29/2015   HGB 12.5 (L) 07/29/2015   HCT 38.2 (L) 07/29/2015   MCV 77.4 (L) 07/29/2015   PLT 443 (H) 07/29/2015     STUDIES: Ct Chest W Contrast  Result Date: 10/28/2015 CLINICAL DATA:  Followup left upper lobe squamous cell carcinoma. Radiation therapy. Restaging. EXAM: CT CHEST WITH CONTRAST TECHNIQUE: Multidetector CT imaging of the chest was performed during intravenous contrast administration. CONTRAST:  38m ISOVUE-300 IOPAMIDOL (ISOVUE-300) INJECTION 61% COMPARISON:  PET-CT on 07/12/2015 FINDINGS: Cardiovascular: Normal heart size. No evidence pericardial effusion. Aortic atherosclerosis. Coronary artery calcification. Mediastinum/Nodes: No masses or pathologically enlarged lymph nodes identified. Lungs/Pleura: Spiculated nodule in the peripheral left upper lobe on image 38/3 currently measures 12 x 11 mm compared to 15 x 13 mm previously. This is consistent with known primary squamous cell carcinoma. Previously seen nodular opacity in the posterior lingula has resolved, consistent with resolving inflammatory or infectious process. Decreased left lower lobe airspace disease is seen since previous study. Mild left lower lobe bronchiectasis again demonstrated with fluid seen within central bronchi possibly due to aspiration. Small loculated right posterior pleural effusion with calcification remains stable. Right lower lobe atelectasis or scarring is also not significant changed. Moderate to severe emphysema again demonstrated. No pneumothorax identified. Upper Abdomen: Normal adrenal glands. Numerous tiny sub-cm low-attenuation lesions again noted throughout the liver, consistent with tiny benign  cysts or biliary hamartomas. Musculoskeletal: No suspicious bone lesions or other significant abnormality. IMPRESSION: Slight decrease in size of spiculated nodule in the peripheral left upper lobe, consistent with known primary squamous cell carcinoma. No new or progressive disease within the thorax. Resolution of previously noted nodular opacity in the posterior lingula, consistent with resolving inflammatory infectious process. Decreased left lower lobe airspace disease also demonstrated. Stable small loculated right posterior pleural effusion and probable right lower lobe atelectasis or scarring. Severe emphysema.  Aortic atherosclerosis. Electronically Signed   By: Rick GellM.D.   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 Patient did not require chemotherapy, but given patient's comorbidities and lung function he likely would not be able to tolerate chemotherapy in the future. CT scan results from October 28, 2015 reviewed independently and reported as above with improvement of his known squamous cell carcinoma. No further intervention is needed at this time. Return to clinic in 4 months with repeat CT scan and further evaluation.  2. Right lower lobe lesion: Most recent CT scan does not report a  right lower lobe lesion. Will continue to monitor with routine imaging. 3. Anemia: Mild, monitor.   Patient expressed understanding and was in agreement with this plan. He also understands that He can call clinic at any time with any questions, concerns, or complaints.    Rick Huger, MD   11/02/2015 8:28 AM

## 2015-12-31 DIAGNOSIS — F17210 Nicotine dependence, cigarettes, uncomplicated: Secondary | ICD-10-CM | POA: Diagnosis not present

## 2015-12-31 DIAGNOSIS — J9611 Chronic respiratory failure with hypoxia: Secondary | ICD-10-CM | POA: Diagnosis not present

## 2015-12-31 DIAGNOSIS — J449 Chronic obstructive pulmonary disease, unspecified: Secondary | ICD-10-CM | POA: Diagnosis not present

## 2015-12-31 DIAGNOSIS — C3490 Malignant neoplasm of unspecified part of unspecified bronchus or lung: Secondary | ICD-10-CM | POA: Diagnosis not present

## 2016-01-23 ENCOUNTER — Encounter: Payer: Self-pay | Admitting: Radiation Oncology

## 2016-01-23 ENCOUNTER — Ambulatory Visit
Admission: RE | Admit: 2016-01-23 | Discharge: 2016-01-23 | Disposition: A | Discharge status: Home / Self Care | Payer: PPO | Source: Ambulatory Visit | Attending: Radiation Oncology | Admitting: Radiation Oncology

## 2016-01-23 VITALS — BP 105/64 | HR 80 | Temp 97.1°F | Resp 21 | Wt 81.7 lb

## 2016-01-23 DIAGNOSIS — Z923 Personal history of irradiation: Secondary | ICD-10-CM | POA: Insufficient documentation

## 2016-01-23 DIAGNOSIS — F17210 Nicotine dependence, cigarettes, uncomplicated: Secondary | ICD-10-CM | POA: Insufficient documentation

## 2016-01-23 DIAGNOSIS — C3412 Malignant neoplasm of upper lobe, left bronchus or lung: Secondary | ICD-10-CM | POA: Insufficient documentation

## 2016-01-23 NOTE — Progress Notes (Signed)
.  gcfu

## 2016-01-23 NOTE — Progress Notes (Signed)
Radiation Oncology Follow up Note  Name: Rick Clark   Date:   01/23/2016 MRN:  [id number] DOB: June 24, 1945    This 71 y.o. male presents to the clinic today for 5 month follow-up status post SB RT for stage I squamous cell carcinoma the left upper lobe.  REFERRING PROVIDER: [name], MD  HPI: Patient is a 71 year old male now out 5 months having completed SB RT radiation therapy to his left upper lobe for a stage I squamous cell carcinoma. Seen today in routine follow-up he is doing well. P when take is good he specifically denies cough hemoptysis chest tightness.. His past CT scan October showed excellent response he scheduled for another CT scan in February.  COMPLICATIONS OF TREATMENT: none  FOLLOW UP COMPLIANCE: keeps appointments   PHYSICAL EXAM:  BP 105/64   Pulse 80   Temp 97.1 F (36.2 C)   Resp (!) 21   Wt 81 lb 10.9 oz (37.1 kg)   BMI 13.59 kg/m  Thin slightly contacting male in NAD. Well-developed well-nourished patient in NAD. HEENT reveals PERLA, EOMI, discs not visualized.  Oral cavity is clear. No oral mucosal lesions are identified. Neck is clear without evidence of cervical or supraclavicular adenopathy. Lungs are clear to A&P. Cardiac examination is essentially unremarkable with regular rate and rhythm without murmur rub or thrill. Abdomen is benign with no organomegaly or masses noted. Motor sensory and DTR levels are equal and symmetric in the upper and lower extremities. Cranial nerves II through XII are grossly intact. Proprioception is intact. No peripheral adenopathy or edema is identified. No motor or sensory levels are noted. Crude visual fields are within normal range.  RADIOLOGY RESULTS: CT scan from October reviewed will review his February CT scans when available  PLAN: Present time patient is doing well. I'm please was overall progress will review his CT scans in February. I've asked to see him back in 6 months for follow-up. Patient knows to  call with any concerns.  I would like to take this opportunity to thank you for allowing me to participate in the care of your patient.Armstead Peaks., MD

## 2016-02-04 ENCOUNTER — Inpatient Hospital Stay
Admission: EM | Admit: 2016-02-04 | Discharge: 2016-02-05 | DRG: 871 | Disposition: A | Discharge status: Home / Self Care | Payer: PPO | Attending: Internal Medicine | Admitting: Internal Medicine

## 2016-02-04 ENCOUNTER — Encounter: Payer: Self-pay | Admitting: Emergency Medicine

## 2016-02-04 ENCOUNTER — Emergency Department: Payer: PPO

## 2016-02-04 DIAGNOSIS — E43 Unspecified severe protein-calorie malnutrition: Secondary | ICD-10-CM | POA: Diagnosis not present

## 2016-02-04 DIAGNOSIS — J439 Emphysema, unspecified: Secondary | ICD-10-CM | POA: Diagnosis present

## 2016-02-04 DIAGNOSIS — R531 Weakness: Secondary | ICD-10-CM

## 2016-02-04 DIAGNOSIS — Z681 Body mass index (BMI) 19 or less, adult: Secondary | ICD-10-CM

## 2016-02-04 DIAGNOSIS — Z716 Tobacco abuse counseling: Secondary | ICD-10-CM | POA: Diagnosis not present

## 2016-02-04 DIAGNOSIS — E871 Hypo-osmolality and hyponatremia: Secondary | ICD-10-CM | POA: Diagnosis present

## 2016-02-04 DIAGNOSIS — Z923 Personal history of irradiation: Secondary | ICD-10-CM

## 2016-02-04 DIAGNOSIS — Z79899 Other long term (current) drug therapy: Secondary | ICD-10-CM | POA: Diagnosis not present

## 2016-02-04 DIAGNOSIS — C3490 Malignant neoplasm of unspecified part of unspecified bronchus or lung: Secondary | ICD-10-CM | POA: Diagnosis not present

## 2016-02-04 DIAGNOSIS — Z7982 Long term (current) use of aspirin: Secondary | ICD-10-CM

## 2016-02-04 DIAGNOSIS — E860 Dehydration: Secondary | ICD-10-CM | POA: Diagnosis present

## 2016-02-04 DIAGNOSIS — R0602 Shortness of breath: Secondary | ICD-10-CM | POA: Diagnosis not present

## 2016-02-04 DIAGNOSIS — C3412 Malignant neoplasm of upper lobe, left bronchus or lung: Secondary | ICD-10-CM | POA: Diagnosis not present

## 2016-02-04 DIAGNOSIS — A419 Sepsis, unspecified organism: Secondary | ICD-10-CM | POA: Diagnosis not present

## 2016-02-04 DIAGNOSIS — R262 Difficulty in walking, not elsewhere classified: Secondary | ICD-10-CM

## 2016-02-04 DIAGNOSIS — J189 Pneumonia, unspecified organism: Secondary | ICD-10-CM

## 2016-02-04 DIAGNOSIS — R918 Other nonspecific abnormal finding of lung field: Secondary | ICD-10-CM | POA: Diagnosis not present

## 2016-02-04 DIAGNOSIS — F17210 Nicotine dependence, cigarettes, uncomplicated: Secondary | ICD-10-CM | POA: Diagnosis present

## 2016-02-04 DIAGNOSIS — R6889 Other general symptoms and signs: Secondary | ICD-10-CM | POA: Diagnosis not present

## 2016-02-04 DIAGNOSIS — R05 Cough: Secondary | ICD-10-CM | POA: Diagnosis not present

## 2016-02-04 HISTORY — DX: Unspecified asthma, uncomplicated: J45.909

## 2016-02-04 LAB — BASIC METABOLIC PANEL
Anion gap: 11 (ref 5–15)
BUN: 20 mg/dL (ref 6–20)
CALCIUM: 8.8 mg/dL — AB (ref 8.9–10.3)
CHLORIDE: 91 mmol/L — AB (ref 101–111)
CO2: 30 mmol/L (ref 22–32)
Creatinine, Ser: 0.8 mg/dL (ref 0.61–1.24)
GFR calc non Af Amer: >60 mL/min (ref >60)
Glucose, Bld: 179 mg/dL — ABNORMAL HIGH (ref 65–99)
Potassium: 3.8 mmol/L (ref 3.5–5.1)
Sodium: 132 mmol/L — ABNORMAL LOW (ref 135–145)

## 2016-02-04 LAB — CBC
HCT: 42.4 % (ref 40.0–52.0)
Hemoglobin: 13.9 g/dL (ref 13.0–18.0)
MCH: 25.4 pg — AB (ref 26.0–34.0)
MCHC: 32.9 g/dL (ref 32.0–36.0)
MCV: 77.3 fL — ABNORMAL LOW (ref 80.0–100.0)
Platelets: 395 10*3/uL (ref 150–440)
RBC: 5.49 MIL/uL (ref 4.40–5.90)
RDW: 14.3 % (ref 11.5–14.5)
WBC: 20.1 10*3/uL — ABNORMAL HIGH (ref 3.8–10.6)

## 2016-02-04 LAB — INFLUENZA PANEL BY PCR (TYPE A & B)
INFLAPCR: NEGATIVE
INFLBPCR: NEGATIVE

## 2016-02-04 LAB — URINALYSIS, COMPLETE (UACMP) WITH MICROSCOPIC
BILIRUBIN URINE: NEGATIVE
Bacteria, UA: NONE SEEN
GLUCOSE, UA: NEGATIVE mg/dL
Ketones, ur: NEGATIVE mg/dL
Leukocytes, UA: NEGATIVE
Nitrite: NEGATIVE
Protein, ur: 30 mg/dL — AB
Specific Gravity, Urine: 1.034 — ABNORMAL HIGH (ref 1.005–1.030)
pH: 6 (ref 5.0–8.0)

## 2016-02-04 LAB — PROTIME-INR
INR: 1.2
Prothrombin Time: 15.3 seconds — ABNORMAL HIGH (ref 11.4–15.2)

## 2016-02-04 LAB — LACTIC ACID, PLASMA
Lactic Acid, Venous: 2.2 mmol/L (ref 0.5–1.9)
Lactic Acid, Venous: 1.8 mmol/L (ref 0.5–1.9)

## 2016-02-04 LAB — APTT: APTT: 37 s — AB (ref 24–36)

## 2016-02-04 LAB — PROCALCITONIN: Procalcitonin: 4.57 ng/mL

## 2016-02-04 MED ORDER — ENOXAPARIN SODIUM 40 MG/0.4ML ~~LOC~~ SOLN
40.0000 mg | SUBCUTANEOUS | Status: DC
  Administered 2016-02-04: 40 mg via SUBCUTANEOUS
  Filled 2016-02-04: qty 0.4

## 2016-02-04 MED ORDER — LEVOFLOXACIN IN D5W 750 MG/150ML IV SOLN
750.0000 mg | Freq: Once | INTRAVENOUS | Status: AC
  Administered 2016-02-04: 750 mg via INTRAVENOUS
  Filled 2016-02-04: qty 150

## 2016-02-04 MED ORDER — ASPIRIN 81 MG PO CHEW
81.0000 mg | CHEWABLE_TABLET | ORAL | Status: DC
  Administered 2016-02-05: 81 mg via ORAL
  Filled 2016-02-04: qty 1

## 2016-02-04 MED ORDER — HYDROCODONE-ACETAMINOPHEN 5-325 MG PO TABS: 1.0000 | ORAL_TABLET | ORAL | Status: DC | PRN

## 2016-02-04 MED ORDER — AZITHROMYCIN 500 MG IV SOLR
500.0000 mg | INTRAVENOUS | Status: DC
  Filled 2016-02-04: qty 500

## 2016-02-04 MED ORDER — IOPAMIDOL (ISOVUE-300) INJECTION 61%
50.0000 mL | Freq: Once | INTRAVENOUS | Status: AC | PRN
  Administered 2016-02-04: 50 mL via INTRAVENOUS

## 2016-02-04 MED ORDER — ALBUTEROL SULFATE (2.5 MG/3ML) 0.083% IN NEBU
2.5000 mg | INHALATION_SOLUTION | RESPIRATORY_TRACT | Status: DC | PRN
Start: 2016-02-04 — End: 2016-02-05

## 2016-02-04 MED ORDER — SODIUM CHLORIDE 0.9 % IV BOLUS (SEPSIS)
1000.0000 mL | Freq: Once | INTRAVENOUS | Status: AC
  Administered 2016-02-04: 1000 mL via INTRAVENOUS

## 2016-02-04 MED ORDER — ONDANSETRON HCL 4 MG/2ML IJ SOLN: 4.0000 mg | Freq: Four times a day (QID) | INTRAMUSCULAR | Status: DC | PRN

## 2016-02-04 MED ORDER — TIOTROPIUM BROMIDE MONOHYDRATE 18 MCG IN CAPS
18.0000 ug | ORAL_CAPSULE | Freq: Every day | RESPIRATORY_TRACT | Status: DC
  Administered 2016-02-05: 11:00:00 18 ug via RESPIRATORY_TRACT
  Filled 2016-02-04: qty 5

## 2016-02-04 MED ORDER — SODIUM CHLORIDE 0.9 % IV SOLN
INTRAVENOUS | Status: DC
  Administered 2016-02-04 – 2016-02-05 (×2): via INTRAVENOUS

## 2016-02-04 MED ORDER — MOMETASONE FURO-FORMOTEROL FUM 200-5 MCG/ACT IN AERO
2.0000 | INHALATION_SPRAY | Freq: Two times a day (BID) | RESPIRATORY_TRACT | Status: DC
  Administered 2016-02-04 – 2016-02-05 (×2): 2 via RESPIRATORY_TRACT
  Filled 2016-02-04: qty 8.8

## 2016-02-04 MED ORDER — ONDANSETRON HCL 4 MG PO TABS: 4.0000 mg | ORAL_TABLET | Freq: Four times a day (QID) | ORAL | Status: DC | PRN

## 2016-02-04 MED ORDER — SENNOSIDES-DOCUSATE SODIUM 8.6-50 MG PO TABS: 1.0000 | ORAL_TABLET | Freq: Every evening | ORAL | Status: DC | PRN

## 2016-02-04 MED ORDER — CEFTRIAXONE SODIUM-DEXTROSE 1-3.74 GM-% IV SOLR
1.0000 g | INTRAVENOUS | Status: DC
  Administered 2016-02-04: 1 g via INTRAVENOUS
  Filled 2016-02-04: qty 50

## 2016-02-04 MED ORDER — BISACODYL 5 MG PO TBEC: 5.0000 mg | DELAYED_RELEASE_TABLET | Freq: Every day | ORAL | Status: DC | PRN

## 2016-02-04 MED ORDER — ACETAMINOPHEN 650 MG RE SUPP: 650.0000 mg | Freq: Four times a day (QID) | RECTAL | Status: DC | PRN

## 2016-02-04 MED ORDER — ACETAMINOPHEN 325 MG PO TABS
650.0000 mg | ORAL_TABLET | Freq: Four times a day (QID) | ORAL | Status: DC | PRN
Start: 2016-02-04 — End: 2016-02-05

## 2016-02-04 NOTE — ED Provider Notes (Signed)
[facility] Emergency Department Provider Note   ____________________________________________   I have reviewed the triage vital signs and the nursing notes.   HISTORY  Chief Complaint Fatigue   History limited by: Not Limited   HPI Rick Clark is a 71 y.o. male who presents to the emergency department today because of concern for weakness. This started yesterday. It has gradually gotten worse. The patient feels like he has no energy. His appetite has been low. He has had diarrhea the past few morning. He has also had associated feeling of chest tightness. Family feels he is taking shallow breaths. The patient has a history of lung cancer. The patient has no known sick contacts.    Past Medical History:  Diagnosis Date  . COPD (chronic obstructive pulmonary disease) ([place])   . Lung cancer [facility]) 1990   Patient states RUL Thoracotomy.  . Lung mass    new spiculated LUL lung mass  . Pneumonia Nov '15  . Shortness of breath dyspnea     Patient Active Problem List   Diagnosis Date Noted  . Malignant neoplasm of upper lobe of left lung ([place]) 08/05/2015  . Pneumothorax 06/24/2015  . Pneumothorax, left   . Protein-calorie malnutrition, severe ([place]) 06/24/2014  . Parapneumonic effusion 06/23/2014  . Failure to thrive in adult 06/23/2014  . Pneumonia 06/22/2014    Past Surgical History:  Procedure Laterality Date  . FLEXIBLE BRONCHOSCOPY Bilateral 09/18/2014   Procedure: FLEXIBLE BRONCHOSCOPY;  Surgeon: Allyne Gee, MD;  Location: [facility] ORS;  Service: Pulmonary;  Laterality: Bilateral;  . LUNG LOBECTOMY Right    Right upper lobe    Prior to Admission medications   Medication Sig Start Date End Date Taking? Authorizing Provider  albuterol (PROVENTIL HFA;VENTOLIN HFA) 108 (90 BASE) MCG/ACT inhaler Inhale 1 puff into the lungs every 6 (six) hours as needed for wheezing or shortness of breath.    Historical Provider, MD  aspirin 81 MG chewable  tablet Chew 81 mg by mouth every morning.    Historical Provider, MD  budesonide-formoterol (SYMBICORT) 160-4.5 MCG/ACT inhaler Inhale 2 puffs into the lungs 2 (two) times daily.    Historical Provider, MD  clotrimazole-betamethasone (LOTRISONE) cream  11/12/15   Historical Provider, MD  tiotropium (SPIRIVA) 18 MCG inhalation capsule Place 18 mcg into inhaler and inhale daily.    Historical Provider, MD  triamcinolone cream (KENALOG) 0.1 %  11/12/15   Historical Provider, MD    Allergies Patient has no known allergies.  Family History  Problem Relation Age of Onset  . Coronary artery disease Father   . Diabetes Mother     Social History Social History  Substance Use Topics  . Smoking status: Current Every Day Smoker    Packs/day: 0.50    Years: 55.00    Types: Cigarettes  . Smokeless tobacco: Never Used  . Alcohol use No    Review of Systems  Constitutional: Negative for fever. Cardiovascular: Positive for chest pain. Respiratory: Positive for shortness of breath. Gastrointestinal: Negative for abdominal pain, vomiting and diarrhea. Neurological: Negative for headaches, focal weakness or numbness.  10-point ROS otherwise negative.  ____________________________________________   PHYSICAL EXAM:  VITAL SIGNS: ED Triage Vitals  Enc Vitals Group     BP 02/04/16 1111 (!) 98/59     Pulse Rate 02/04/16 1111 (!) 121     Resp 02/04/16 1111 16     Temp 02/04/16 1111 98 F (36.7 C)     Temp Source 02/04/16 1111 Oral  SpO2 02/04/16 1111 95 %     Weight 02/04/16 1113 83 lb (37.6 kg)     Height 02/04/16 1113 '5\' 5"'$  (1.651 m)   Constitutional: Alert and oriented. Chronically ill appearing, cachectic.  Eyes: Conjunctivae are normal. Normal extraocular movements. ENT   Head: Normocephalic and atraumatic.   Nose: No congestion/rhinnorhea.   Mouth/Throat: Mucous membranes are moist.   Neck: No stridor. Hematological/Lymphatic/Immunilogical: No cervical  lymphadenopathy. Cardiovascular: Normal rate, regular rhythm.  No murmurs, rubs, or gallops.  Respiratory: Normal respiratory effort without tachypnea nor retractions. Breath sounds are clear and equal bilaterally. No wheezes/rales/rhonchi. Gastrointestinal: Soft and non tender. No rebound. No guarding.  Genitourinary: Deferred Musculoskeletal: Normal range of motion in all extremities. No lower extremity edema. Neurologic:  Normal speech and language. No gross focal neurologic deficits are appreciated.  Skin:  Skin is warm, dry and intact. No rash noted. Psychiatric: Mood and affect are normal. Speech and behavior are normal. Patient exhibits appropriate insight and judgment.  ____________________________________________    LABS (pertinent positives/negatives)  Labs Reviewed  BASIC METABOLIC PANEL - Abnormal; Notable for the following:       Result Value   Sodium 132 (*)    Chloride 91 (*)    Glucose, Bld 179 (*)    Calcium 8.8 (*)    All other components within normal limits  CBC - Abnormal; Notable for the following:    WBC 20.1 (*)    MCV 77.3 (*)    MCH 25.4 (*)    All other components within normal limits  CULTURE, BLOOD (ROUTINE X 2)  CULTURE, BLOOD (ROUTINE X 2)  INFLUENZA PANEL BY PCR (TYPE A & B)  URINALYSIS, COMPLETE (UACMP) WITH MICROSCOPIC  CBG MONITORING, ED     ____________________________________________   EKG  I, Nance Pear, attending physician, personally viewed and interpreted this EKG  EKG Time: 1128 Rate: 125 Rhythm: sinus tachycardia Axis: right axis deviation Intervals: qtc 415 QRS: narrow ST changes: no st elevation Impression: abnormal ekg    ____________________________________________    RADIOLOGY  CXR IMPRESSION:  1. New opacity within the right mid upper lung extending  peripherally. Possible pneumonia but cannot exclude recurrence of  carcinoma. Recommend CT of the chest with IV contrast media to  assess further.  2.  Emphysema.      CT chest IMPRESSION:  Extensive right lower lobe airspace disease most consistent with  pneumonia. Recommend plain film follow-up to clearing.    Slight decrease in the size of the patient's known left upper lobe  carcinoma.    Small right pleural effusion.    Severe emphysema.    Atherosclerosis.    ____________________________________________   PROCEDURES  Procedures  ____________________________________________   INITIAL IMPRESSION / ASSESSMENT AND PLAN / ED COURSE  Pertinent labs & imaging results that were available during my care of the patient were reviewed by me and considered in my medical decision making (see chart for details).  Patient with history of lung cancer presents to the emergency department today because of concerns for weakness. Workup concerning for pneumonia. Patient will be given IV antibiotics and admitted to hospitalist service.  ____________________________________________   FINAL CLINICAL IMPRESSION(S) / ED DIAGNOSES  Final diagnoses:  Weakness  Community acquired pneumonia of right lung, unspecified part of lung     Note: This dictation was prepared with Sales executive. Any transcriptional errors that result from this process are unintentional     Nance Pear, MD 02/04/16 1441

## 2016-02-04 NOTE — Consult Note (Signed)
Pharmacy Antibiotic Note  Rick Clark is a 71 y.o. male admitted on 02/04/2016 with pneumonia and sepsis.  Pharmacy has been consulted for ceftriaxone and azithromycin dosing. Pt received a dose of levofloxacin in the ED  Plan: ceftriaxone 1g q 24 hr starting this evening. azithromycin '500mg'$  q 24 hr starting 24 hr after levofloxacin dose  Height: '5\' 5"'$  (165.1 cm) Weight: 83 lb (37.6 kg) IBW/kg (Calculated) : 61.5  Temp (24hrs), Avg:98 F (36.7 C), Min:98 F (36.7 C), Max:98 F (36.7 C)   Recent Labs Lab 02/04/16 1122  WBC 20.1*  CREATININE 0.80    Estimated Creatinine Clearance: 44.4 mL/min (by C-G formula based on SCr of 0.8 mg/dL).    No Known Allergies  Antimicrobials this admission: levofloxacin 1/23 >> one dose azithromycin 1/24 >>  Ceftriaxone 1/23>>  Dose adjustments this admission:   Microbiology results: 1/23 BCx:   Thank you for allowing pharmacy to be a part of this patient's care.  Ramond Dial, Pharm.D, BCPS Clinical Pharmacist  02/04/2016 4:58 PM

## 2016-02-04 NOTE — ED Notes (Signed)
Attempted to call report.  Nurse unavailable at this time, will call back in 5 minutes.

## 2016-02-04 NOTE — Progress Notes (Signed)
Pt's first lactic acid 2.2, Dr. Bridgett Larsson made aware. No new orders, will be rechecked in 3 hours.

## 2016-02-04 NOTE — ED Triage Notes (Signed)
Pt to ed via ems with reports of feeling fatigued since Sunday. Pt with hx of lung ca.

## 2016-02-04 NOTE — H&P (Signed)
[place] [facility] [place] NAME: Rick Clark    MR#:  [id number]  DATE OF BIRTH:  02-13-1945  DATE OF ADMISSION:  02/04/2016  PRIMARY CARE PHYSICIAN: [name], MD   REQUESTING/REFERRING PHYSICIAN: Nance Pear, MD  CHIEF COMPLAINT:   Chief Complaint  Patient presents with  . Fatigue   Weakness and shortness of breath. HISTORY OF PRESENT ILLNESS:  Esequiel Kleinfelter  is a 71 y.o. male with a known history of COPD, lung cancer, pneumonia. The patient was sent to ED from home by his daughter due to above chief complaints. Patient denies any fever or chills but has chest tightness, cough with sputum and shortness of breath. He was found had tachycardia, tachypnea and leukocytosis. Chest x-ray show right sided pneumonia.  PAST MEDICAL HISTORY:   Past Medical History:  Diagnosis Date  . Asthma   . COPD (chronic obstructive pulmonary disease) ([place])   . Lung cancer [facility]) 1990   Patient states RUL Thoracotomy.  . Lung mass    new spiculated LUL lung mass  . Pneumonia Nov '15  . Shortness of breath dyspnea     PAST SURGICAL HISTORY:   Past Surgical History:  Procedure Laterality Date  . FLEXIBLE BRONCHOSCOPY Bilateral 09/18/2014   Procedure: FLEXIBLE BRONCHOSCOPY;  Surgeon: Allyne Gee, MD;  Location: [facility] ORS;  Service: Pulmonary;  Laterality: Bilateral;  . LUNG LOBECTOMY Right    Right upper lobe    SOCIAL HISTORY:   Social History  Substance Use Topics  . Smoking status: Current Every Day Smoker    Packs/day: 0.50    Years: 55.00    Types: Cigarettes  . Smokeless tobacco: Never Used  . Alcohol use No    FAMILY HISTORY:   Family History  Problem Relation Age of Onset  . Coronary artery disease Father   . Diabetes Mother     DRUG ALLERGIES:  No Known Allergies  REVIEW OF SYSTEMS:   Review of Systems  Constitutional: Positive for malaise/fatigue and weight loss. Negative for chills and fever.  HENT:  Negative for congestion and sore throat.   Eyes: Negative for blurred vision and double vision.  Respiratory: Positive for cough and shortness of breath. Negative for hemoptysis, sputum production, wheezing and stridor.   Cardiovascular: Negative for chest pain, palpitations and leg swelling.  Gastrointestinal: Positive for diarrhea. Negative for abdominal pain, blood in stool, melena, nausea and vomiting.  Genitourinary: Negative for dysuria and hematuria.  Musculoskeletal: Negative for back pain.  Skin: Negative for itching and rash.  Neurological: Positive for weakness. Negative for dizziness, focal weakness and loss of consciousness.  Psychiatric/Behavioral: Negative for depression. The patient is not nervous/anxious.     MEDICATIONS AT HOME:   Prior to Admission medications   Medication Sig Start Date End Date Taking? Authorizing Provider  albuterol (PROVENTIL HFA;VENTOLIN HFA) 108 (90 BASE) MCG/ACT inhaler Inhale 1 puff into the lungs every 6 (six) hours as needed for wheezing or shortness of breath.   Yes Historical Provider, MD  budesonide-formoterol (SYMBICORT) 160-4.5 MCG/ACT inhaler Inhale 2 puffs into the lungs 2 (two) times daily.   Yes Historical Provider, MD  clotrimazole-betamethasone (LOTRISONE) cream Apply 1 application topically 2 (two) times daily.  11/12/15  Yes Historical Provider, MD  tiotropium (SPIRIVA) 18 MCG inhalation capsule Place 18 mcg into inhaler and inhale daily.   Yes Historical Provider, MD  triamcinolone cream (KENALOG) 0.1 % Apply 1 application topically 2 (two) times daily.  11/12/15  Yes Historical Provider, MD  aspirin 81 MG chewable tablet Chew 81 mg by mouth every morning.    Historical Provider, MD      VITAL SIGNS:  Blood pressure 104/67, pulse (!) 121, temperature 98 F (36.7 C), temperature source Oral, resp. rate (!) 27, height '5\' 5"'$  (1.651 m), weight 83 lb (37.6 kg), SpO2 95 %.  PHYSICAL EXAMINATION:  Physical Exam  GENERAL:  71  y.o.-year-old patient lying in the bed with no acute distress. Severe malnutrition. EYES: Pupils equal, round, reactive to light and accommodation. No scleral icterus. Extraocular muscles intact.  HEENT: Head atraumatic, normocephalic. Oropharynx and nasopharynx clear. Very dry oral mucosa. NECK:  Supple, no jugular venous distention. No thyroid enlargement, no tenderness.  LUNGS: Decreased breath sounds and crackles on right side, no wheezing, rales,rhonchi or crepitation. No use of accessory muscles of respiration.  CARDIOVASCULAR: S1, S2 normal. No murmurs, rubs, or gallops.  ABDOMEN: Soft, nontender, nondistended. Bowel sounds present. No organomegaly or mass.  EXTREMITIES: No pedal edema, cyanosis, or clubbing.  NEUROLOGIC: Cranial nerves II through XII are intact. Muscle strength 3-4/5 in all extremities. Sensation intact. Gait not checked.  PSYCHIATRIC: The patient is alert and oriented x 3.  SKIN: No obvious rash, lesion, or ulcer.   LABORATORY PANEL:   CBC  Recent Labs Lab 02/04/16 1122  WBC 20.1*  HGB 13.9  HCT 42.4  PLT 395   ------------------------------------------------------------------------------------------------------------------  Chemistries   Recent Labs Lab 02/04/16 1122  NA 132*  K 3.8  CL 91*  CO2 30  GLUCOSE 179*  BUN 20  CREATININE 0.80  CALCIUM 8.8*   ------------------------------------------------------------------------------------------------------------------  Cardiac Enzymes No results for input(s): TROPONINI in the last 168 hours. ------------------------------------------------------------------------------------------------------------------  RADIOLOGY:  Dg Chest 2 View  Result Date: 02/04/2016 CLINICAL DATA:  Shortness of breath, fatigue, productive cough, history of right upper lobectomy for lung carcinoma EXAM: CHEST  2 VIEW COMPARISON:  CT chest of 10/28/2015 and chest x-ray of 07/05/2015 FINDINGS: The lungs remain very  hyperaerated consistent with emphysema. There is however more opacity now in the right mid upper lung extending peripherally. Although this could represent pneumonia, recurrence of lung carcinoma cannot be excluded. CT of the chest with IV contrast media is recommended to assess further. Mediastinal and hilar contours are unremarkable. The heart is within normal limits in size. No acute bony abnormality is seen. IMPRESSION: 1. New opacity within the right mid upper lung extending peripherally. Possible pneumonia but cannot exclude recurrence of carcinoma. Recommend CT of the chest with IV contrast media to assess further. 2. Emphysema. Electronically Signed   By: Ivar Drape M.D.   On: 02/04/2016 12:53   Ct Chest W Contrast  Result Date: 02/04/2016 CLINICAL DATA:  Cough and shortness of breath since 02/01/2016. Abnormal chest x-ray today. COPD. EXAM: CT CHEST WITH CONTRAST TECHNIQUE: Multidetector CT imaging of the chest was performed during intravenous contrast administration. CONTRAST:  50 ml ISOVUE-300 IOPAMIDOL (ISOVUE-300) INJECTION 61% COMPARISON:  PA and lateral chest this same day. CT chest 10/28/2015 FINDINGS: Cardiovascular: Heart size is normal. No pericardial effusion. Calcific aortic and coronary atherosclerosis is seen. Mediastinum/Nodes: No axillary, hilar or mediastinal lymphadenopathy. No hiatal hernia. Thyroid gland is unremarkable. Lungs/Pleura: Very small right pleural effusion is seen. Severe centrilobular emphysematous disease is present. New extensive airspace disease is present in the right lower lobe. Left upper lobe nodule consistent with the patient's known carcinoma is again seen and today measures 0.9 cm AP x 0.9 cm transverse on image 37 compared  to 1.2 x 1.1 cm on the prior CT. Left basilar bronchiectasis and mucous plugging are identified. Upper Abdomen: Mild intrahepatic biliary ductal dilatation is seen. Atherosclerosis noted. Musculoskeletal: No acute bony abnormality. No lytic  or sclerotic lesion. IMPRESSION: Extensive right lower lobe airspace disease most consistent with pneumonia. Recommend plain film follow-up to clearing. Slight decrease in the size of the patient's known left upper lobe carcinoma. Small right pleural effusion. Severe emphysema. Atherosclerosis. Electronically Signed   By: Inge Rise M.D.   On: 02/04/2016 13:36      IMPRESSION AND PLAN:   Sepsis with pneumonia. The patient will be admitted to medical floor, continue IV antibiotics, follow-up CBC and cultures. NEB and Robitussin when necessary. Hyponatremia with dehydration, start IV fluid support and follow-up BMP. Lung cancer. Status post radiation. Severe malnutrition. Dietitian consult.   All the records are reviewed and case discussed with ED provider. Management plans discussed with the patient, his daughter and they are in agreement.  CODE STATUS: Full code  TOTAL TIME TAKING CARE OF THIS PATIENT: 56 minutes.    Demetrios Loll M.D on 02/04/2016 at 3:11 PM  Between 7am to 6pm - Pager - [phone]  After 6pm go to [email or web address] - Proofreader  [facility]  Office  [phone]  CC: Primary care physician; [name], MD   Note: This dictation was prepared with Dragon dictation along with smaller phrase technology. Any transcriptional errors that result from this process are unintentional.

## 2016-02-04 NOTE — ED Notes (Signed)
Patient transported to X-ray 

## 2016-02-05 LAB — BASIC METABOLIC PANEL
ANION GAP: 5 (ref 5–15)
BUN: 19 mg/dL (ref 6–20)
CHLORIDE: 102 mmol/L (ref 101–111)
CO2: 26 mmol/L (ref 22–32)
Calcium: 7.9 mg/dL — ABNORMAL LOW (ref 8.9–10.3)
Creatinine, Ser: 0.64 mg/dL (ref 0.61–1.24)
GFR calc non Af Amer: >60 mL/min (ref >60)
Glucose, Bld: 107 mg/dL — ABNORMAL HIGH (ref 65–99)
POTASSIUM: 3.6 mmol/L (ref 3.5–5.1)
Sodium: 133 mmol/L — ABNORMAL LOW (ref 135–145)

## 2016-02-05 LAB — CBC
HEMATOCRIT: 33.4 % — AB (ref 40.0–52.0)
HEMOGLOBIN: 10.9 g/dL — AB (ref 13.0–18.0)
MCH: 25.4 pg — AB (ref 26.0–34.0)
MCHC: 32.8 g/dL (ref 32.0–36.0)
MCV: 77.4 fL — AB (ref 80.0–100.0)
Platelets: 297 10*3/uL (ref 150–440)
RBC: 4.31 MIL/uL — AB (ref 4.40–5.90)
RDW: 13.9 % (ref 11.5–14.5)
WBC: 13.9 10*3/uL — AB (ref 3.8–10.6)

## 2016-02-05 MED ORDER — LEVOFLOXACIN 750 MG PO TABS: 750.0000 mg | ORAL_TABLET | Freq: Every day | ORAL | 0 refills | Status: DC

## 2016-02-05 MED ORDER — LEVOFLOXACIN 25 MG/ML PO SOLN: 750.0000 mg | Freq: Every day | ORAL | 0 refills | Status: DC

## 2016-02-05 MED ORDER — ADULT MULTIVITAMIN W/MINERALS CH: 1.0000 | ORAL_TABLET | Freq: Every day | ORAL | Status: DC

## 2016-02-05 MED ORDER — ENSURE ENLIVE PO LIQD
237.0000 mL | Freq: Two times a day (BID) | ORAL | Status: DC
  Administered 2016-02-05: 11:00:00 237 mL via ORAL

## 2016-02-05 MED ORDER — ENSURE ENLIVE PO LIQD: 237.0000 mL | Freq: Two times a day (BID) | ORAL | 12 refills | Status: AC

## 2016-02-05 MED ORDER — SODIUM CHLORIDE 0.9 % IV SOLN
INTRAVENOUS | Status: DC
  Administered 2016-02-05: 11:00:00 via INTRAVENOUS

## 2016-02-05 NOTE — Progress Notes (Signed)
Initial Nutrition Assessment  DOCUMENTATION CODES:   Severe malnutrition in context of chronic illness, Underweight  INTERVENTION:  Recommend liberalizing diet from Heart Healthy to Regular in setting of severe malnutrition with ongoing weight loss.  Continue Ensure Enlive po BID, each supplement provides 350 kcal and 20 grams of protein. Encouraged patient to either drink 3 Ensure Original daily at home or drink 2 Ensure Enlive at home to provide adequate calories and protein.  Provide snacks po BID between meals - RD to order.  Provide multivitamin with minerals daily.  Discussed that patient has high needs for calories and protein due to catabolic state of lung cancer diagnosis and COPD. Encouraged intake of adequate calories and protein from meals and beverages and encouraged patient to drink more ONS if his appetite is poor and he is unable to eat meals.  If patient to receive more treatment for lung cancer in the future, would recommend placement of PEG tube for enteral nutrition. Patient is still losing weight even with small, frequent meals and oral nutrition supplements at home.  NUTRITION DIAGNOSIS:   Malnutrition (Severe) related to chronic illness, cancer and cancer related treatments (lung cancer, COPD) as evidenced by severe depletion of muscle mass, severe depletion of body fat, 13.4 percent weight loss over 6 months.  GOAL:   Patient will meet greater than or equal to 90% of their needs  MONITOR:   PO intake, Supplement acceptance, Labs, Weight trends  REASON FOR ASSESSMENT:   Consult Assessment of nutrition requirement/status  ASSESSMENT:   71 y.o. male with a known history of COPD, lung cancer (completed XRT in August 2017), pneumonia, who presents with cough and weakness, found to have sepsis due to PNA.   Spoke with patient and family members at bedside. He reports he had a poor appetite on 1/21 and 1/22, but it is improving now. He reports he was able to  finish 90% of his breakfast this morning which was a sausage egg and cheese biscuit a family member brought in. Patient reports he had abdominal pain but that is now improved. Denies N/V. Patient reports difficulty chewing in setting of missing teeth - he typically eats softer foods. He reports usual intake is 2 meals per day with 2 snacks between meals and 1-2 Ensure. In the past he has had Ensure Enlive, but right now he is drinking a case of Ensure Original.  He reports in November 2015 he weighed his UBW of 115 lbs and that he slowly began losing weight. Most recent weight loss was 12 lbs (13.4% body weight) over 7 months, which is significant for time frame.  Medications reviewed and include: azithromycin, ceftriaxone, NS @ 75 ml/hr.  Labs reviewed: Sodium 133, Glucose 107.   Nutrition-Focused physical exam completed. Findings are severe fat depletion, severe muscle depletion, and no edema.   Diet Order:  Diet Heart Room service appropriate? Yes; Fluid consistency: Thin Diet - low sodium heart healthy  Skin:  Reviewed, no issues  Last BM:  02/04/2016  Height:   Ht Readings from Last 1 Encounters:  02/04/16 '5\' 5"'$  (1.651 m)    Weight:   Wt Readings from Last 1 Encounters:  02/04/16 77 lb 3.2 oz (35 kg)    Ideal Body Weight:  61.8 kg  BMI:  Body mass index is 12.85 kg/m.  Estimated Nutritional Needs:   Kcal:  1225-1400 (35-40 kcal/kg)  Protein:  53-70 grams (1.5-2 grams/kg)  Fluid:  1.2 L/day  EDUCATION NEEDS:   Education needs addressed  Willey Blade, MS, RD, LDN Pager: [phone] After Hours Pager: [phone]

## 2016-02-05 NOTE — Progress Notes (Signed)
Discussed discharge instructions and medications with pt and his daughter. IV removed. All questions addressed. Pt transported home via car by his daughter.  Danielle Elianna Windom, RN 

## 2016-02-05 NOTE — Progress Notes (Addendum)
[place] at [place] NAME: Rick Clark    MR#:  [id number]  DATE OF BIRTH:  1945-07-20  SUBJECTIVE:  Doing well this am Better then yesterday  REVIEW OF SYSTEMS:    Review of Systems  Constitutional: Negative for chills, fever and malaise/fatigue.  HENT: Negative.  Negative for ear discharge, ear pain, hearing loss, nosebleeds and sore throat.   Eyes: Negative.  Negative for blurred vision and pain.  Respiratory: Positive for cough. Negative for hemoptysis, shortness of breath and wheezing.   Cardiovascular: Negative.  Negative for chest pain, palpitations and leg swelling.  Gastrointestinal: Negative.  Negative for abdominal pain, blood in stool, diarrhea, nausea and vomiting.  Genitourinary: Negative.  Negative for dysuria.  Musculoskeletal: Negative.  Negative for back pain.  Skin: Negative.   Neurological: Positive for weakness. Negative for dizziness, tremors, speech change, focal weakness, seizures and headaches.  Endo/Heme/Allergies: Negative.  Does not bruise/bleed easily.  Psychiatric/Behavioral: Negative.  Negative for depression, hallucinations and suicidal ideas.    Tolerating Diet:yes      DRUG ALLERGIES:  No Known Allergies  VITALS:  Blood pressure (!) 94/42, pulse 74, temperature 98.2 F (36.8 C), temperature source Oral, resp. rate (!) 24, height '5\' 5"'$  (1.651 m), weight 35 kg (77 lb 3.2 oz), SpO2 92 %.  PHYSICAL EXAMINATION:   Physical Exam  Constitutional: He is oriented to person, place, and time. No distress.  Thin frail  HENT:  Head: Normocephalic.  Eyes: No scleral icterus.  Neck: Normal range of motion. Neck supple. No JVD present. No tracheal deviation present.  Cardiovascular: Normal rate, regular rhythm and normal heart sounds.  Exam reveals no gallop and no friction rub.   No murmur heard. Pulmonary/Chest: Effort normal. No respiratory distress. He has no wheezes. He has rales (RLL). He  exhibits no tenderness.  Abdominal: Soft. Bowel sounds are normal. He exhibits no distension and no mass. There is no tenderness. There is no rebound and no guarding.  Musculoskeletal: Normal range of motion. He exhibits no edema.  Neurological: He is alert and oriented to person, place, and time.  Skin: Skin is warm. No rash noted. No erythema.  Psychiatric: Affect and judgment normal.      LABORATORY PANEL:   CBC  Recent Labs Lab 02/05/16 0343  WBC 13.9*  HGB 10.9*  HCT 33.4*  PLT 297   ------------------------------------------------------------------------------------------------------------------  Chemistries   Recent Labs Lab 02/05/16 0343  NA 133*  K 3.6  CL 102  CO2 26  GLUCOSE 107*  BUN 19  CREATININE 0.64  CALCIUM 7.9*   ------------------------------------------------------------------------------------------------------------------  Cardiac Enzymes No results for input(s): TROPONINI in the last 168 hours. ------------------------------------------------------------------------------------------------------------------  RADIOLOGY:  Dg Chest 2 View  Result Date: 02/04/2016 CLINICAL DATA:  Shortness of breath, fatigue, productive cough, history of right upper lobectomy for lung carcinoma EXAM: CHEST  2 VIEW COMPARISON:  CT chest of 10/28/2015 and chest x-ray of 07/05/2015 FINDINGS: The lungs remain very hyperaerated consistent with emphysema. There is however more opacity now in the right mid upper lung extending peripherally. Although this could represent pneumonia, recurrence of lung carcinoma cannot be excluded. CT of the chest with IV contrast media is recommended to assess further. Mediastinal and hilar contours are unremarkable. The heart is within normal limits in size. No acute bony abnormality is seen. IMPRESSION: 1. New opacity within the right mid upper lung extending peripherally. Possible pneumonia but cannot exclude recurrence of carcinoma.  Recommend CT of the chest  with IV contrast media to assess further. 2. Emphysema. Electronically Signed   By: Ivar Drape M.D.   On: 02/04/2016 12:53   Ct Chest W Contrast  Result Date: 02/04/2016 CLINICAL DATA:  Cough and shortness of breath since 02/01/2016. Abnormal chest x-ray today. COPD. EXAM: CT CHEST WITH CONTRAST TECHNIQUE: Multidetector CT imaging of the chest was performed during intravenous contrast administration. CONTRAST:  50 ml ISOVUE-300 IOPAMIDOL (ISOVUE-300) INJECTION 61% COMPARISON:  PA and lateral chest this same day. CT chest 10/28/2015 FINDINGS: Cardiovascular: Heart size is normal. No pericardial effusion. Calcific aortic and coronary atherosclerosis is seen. Mediastinum/Nodes: No axillary, hilar or mediastinal lymphadenopathy. No hiatal hernia. Thyroid gland is unremarkable. Lungs/Pleura: Very small right pleural effusion is seen. Severe centrilobular emphysematous disease is present. New extensive airspace disease is present in the right lower lobe. Left upper lobe nodule consistent with the patient's known carcinoma is again seen and today measures 0.9 cm AP x 0.9 cm transverse on image 37 compared to 1.2 x 1.1 cm on the prior CT. Left basilar bronchiectasis and mucous plugging are identified. Upper Abdomen: Mild intrahepatic biliary ductal dilatation is seen. Atherosclerosis noted. Musculoskeletal: No acute bony abnormality. No lytic or sclerotic lesion. IMPRESSION: Extensive right lower lobe airspace disease most consistent with pneumonia. Recommend plain film follow-up to clearing. Slight decrease in the size of the patient's known left upper lobe carcinoma. Small right pleural effusion. Severe emphysema. Atherosclerosis. Electronically Signed   By: Inge Rise M.D.   On: 02/04/2016 13:36     ASSESSMENT AND PLAN:   71 year old male with known lung carcinoma status post radiation who presents with cough and weakness and found to have community-acquired pneumonia.  1.  Sepsis with tachycardia and leukocytosis due to pneumonia Sepsis is improved  2. Pneumonia: Patient is on Rocephin and azithromycin and upon discharge will be discharged with Levaquin. 3. Left upper lobe lung carcinoma: Patient is followed by oncology service and has an appointment in February  4. Acute kidney injury in the setting of sepsis and pneumonia which is resolved with IV fluids  5. Hyponatremia due to, nation of pneumonia lung cancer  6. Severe protein calorie malnutrition: Patient will continue feeding supplement.   7. Tobacco dependence: Patient is encouraged to quit smoking. Counseling was provided for 4 minutes.    Management plans discussed with the patient and he is in agreement.  CODE STATUS: FULL  TOTAL TIME TAKING CARE OF THIS PATIENT: 30 minutes.     POSSIBLE D/C today, DEPENDING ON CLINICAL CONDITION.   Lus Kriegel M.D on 02/05/2016 at 11:42 AM  Between 7am to 6pm - Pager - [phone] After 6pm go to [email or web address] - password [email or web address]  Office  [phone]  CC: Primary care physician; [name], MD  Note: This dictation was prepared with Dragon dictation along with smaller phrase technology. Any transcriptional errors that result from this process are unintentional.

## 2016-02-05 NOTE — Progress Notes (Signed)
Per PT, pt has no PT needs at discharge.  Dr. Benjie Karvonen made aware and verbal order given for discharge.  Clarise Cruz, RN

## 2016-02-05 NOTE — Progress Notes (Signed)
While rounding the unit the [facility] was asked to visit the Pt. Pt appeared to be in good spirit with ex-wife and daughter bedside. [place] talked with Pt about family, healthcare, grandchildren, and post treatment. Pt to be discharged today. [place] offered prayers and ministry of presence.    02/05/16 1200  Clinical Encounter Type  Visited With Patient;Patient and family together  Visit Type Initial;Spiritual support  Referral From Nurse  Consult/Referral To Chaplain  Spiritual Encounters  Spiritual Needs Prayer

## 2016-02-05 NOTE — Evaluation (Signed)
Physical Therapy Evaluation Patient Details Name: Rick Clark MRN: [id number] DOB: 05/28/1945 Today's Date: 02/05/2016   History of Present Illness  Pt admitted for sepsis with complaints of fatigue and SOB. Now diagnosed with HCAP. History includes COPD, lung cancer, and pnemonia.   Clinical Impression  Pt is a pleasant 71 year old male who was admitted for sepsis. Pt performs bed mobility/transfers with independence and ambulation with supervision and no AD. All mobility performed on room air with no SOB symptoms. Pt does not require any further PT needs at this time. Pt will be dc in house and does not require follow up. RN aware. Will dc current orders.     Follow Up Recommendations No PT follow up    Equipment Recommendations       Recommendations for Other Services       Precautions / Restrictions Precautions Precautions: Fall Restrictions Weight Bearing Restrictions: No      Mobility  Bed Mobility Overal bed mobility: Independent             General bed mobility comments: safe technique performed  Transfers Overall transfer level: Independent Equipment used: None             General transfer comment: transfers performed with safe technique, no AD required  Ambulation/Gait Ambulation/Gait assistance: Supervision Ambulation Distance (Feet): 200 Feet Assistive device: None Gait Pattern/deviations: WFL(Within Functional Limits)     General Gait Details: safe technique with ambulation with no LOB noted. SLight fatigue noted and pt begins to cough with increased exertion.   Stairs            Wheelchair Mobility    Modified Rankin (Stroke Patients Only)       Balance Overall balance assessment: Independent                                           Pertinent Vitals/Pain Pain Assessment: No/denies pain    Home Living Family/patient expects to be discharged to:: Private residence Living Arrangements:  Children;Other relatives Available Help at Discharge: Family;Available 24 hours/day Type of Home: House Home Access: Stairs to enter Entrance Stairs-Rails: None Entrance Stairs-Number of Steps: 2 Home Layout: One level Home Equipment: None      Prior Function Level of Independence: Independent         Comments: Pt still working job and very active with ambulation without AD     Hand Dominance        Extremity/Trunk Assessment   Upper Extremity Assessment Upper Extremity Assessment: Overall WFL for tasks assessed    Lower Extremity Assessment Lower Extremity Assessment: Overall WFL for tasks assessed       Communication   Communication: No difficulties  Cognition Arousal/Alertness: Awake/alert Behavior During Therapy: WFL for tasks assessed/performed Overall Cognitive Status: Within Functional Limits for tasks assessed                      General Comments      Exercises     Assessment/Plan    PT Assessment Patent does not need any further PT services  PT Problem List            PT Treatment Interventions      PT Goals (Current goals can be found in the Care Plan section)  Acute Rehab PT Goals Patient Stated Goal: to go home PT Goal Formulation: All assessment  and education complete, DC therapy Time For Goal Achievement: 20-Feb-2016 Potential to Achieve Goals: Good    Frequency     Barriers to discharge        Co-evaluation               End of Session Equipment Utilized During Treatment: Gait belt Activity Tolerance: Patient tolerated treatment well Patient left: in bed;with bed alarm set Nurse Communication: Mobility status         Time: 6237-6283 PT Time Calculation (min) (ACUTE ONLY): 10 min   Charges:   PT Evaluation $PT Eval Low Complexity: 1 Procedure     PT G Codes:        Tiffanni Scarfo 2016/02/20, 3:58 PM Greggory Stallion, PT, DPT [phone]

## 2016-02-05 NOTE — Progress Notes (Signed)
Family Meeting Note  Advance Directive:yes  Today a meeting took place with the Patient.    The following clinical team members were present during this meeting:MD  The following were discussed:Patient's diagnosis: PNA Lung cancer Patient's progosis: Unable to determine and Goals for treatment: Full Code  Additional follow-up to be provided: none  Time spent during discussion:16 minutes  Gail Vendetti, MD

## 2016-02-05 NOTE — Discharge Summary (Signed)
[facility] NAME: Rick Clark    MR#:  [id number]  DATE OF BIRTH:  01/09/1946  DATE OF ADMISSION:  02/04/2016 ADMITTING PHYSICIAN: Demetrios Loll, MD  DATE OF DISCHARGE: 02/05/2016  PRIMARY CARE PHYSICIAN: [name], MD    ADMISSION DIAGNOSIS:  Weakness [R53.1] Community acquired pneumonia of right lung, unspecified part of lung [J18.9]  DISCHARGE DIAGNOSIS:  Active Problems:   Sepsis ([place])   SECONDARY DIAGNOSIS:   Past Medical History:  Diagnosis Date  . Asthma   . COPD (chronic obstructive pulmonary disease) ([place])   . Lung cancer [facility]) 1990   Patient states RUL Thoracotomy.  . Lung mass    new spiculated LUL lung mass  . Pneumonia Nov '15  . Shortness of breath dyspnea     HOSPITAL COURSE:    71 year old male with known lung carcinoma status post radiation who presents with cough and weakness and found to have community-acquired pneumonia.  1. Sepsis with tachycardia and leukocytosis due to pneumonia Sepsis has improved  2. Pneumonia: Patient was on Rocephin and azithromycin and upon discharge will be discharged with Levaquin. 3. Left upper lobe lung carcinoma: Patient is followed by oncology service and has an appointment in February  4. Acute kidney injury in the setting of sepsis and pneumonia which is resolved with IV fluids  5. Hyponatremia due to decreased Oral intake and  pneumonia and lung cancer.  6. Severe protein calorie malnutrition: Patient will continue feeding supplement.   7. Tobacco dependence: Patient is encouraged to quit smoking. Counseling was provided for 4 minutes   DISCHARGE CONDITIONS AND DIET:   Stable Regular diet  CONSULTS OBTAINED:    DRUG ALLERGIES:  No Known Allergies  DISCHARGE MEDICATIONS:   Current Discharge Medication List    START taking these medications   Details  feeding supplement, ENSURE ENLIVE, (ENSURE ENLIVE) LIQD Take 237 mLs by mouth 2  (two) times daily between meals. Qty: 237 mL, Refills: 12    levofloxacin (LEVAQUIN) 750 MG tablet Take 1 tablet (750 mg total) by mouth daily. Qty: 5 tablet, Refills: 0      CONTINUE these medications which have NOT CHANGED   Details  albuterol (PROVENTIL HFA;VENTOLIN HFA) 108 (90 BASE) MCG/ACT inhaler Inhale 1 puff into the lungs every 6 (six) hours as needed for wheezing or shortness of breath.    budesonide-formoterol (SYMBICORT) 160-4.5 MCG/ACT inhaler Inhale 2 puffs into the lungs 2 (two) times daily.    clotrimazole-betamethasone (LOTRISONE) cream Apply 1 application topically 2 (two) times daily.     tiotropium (SPIRIVA) 18 MCG inhalation capsule Place 18 mcg into inhaler and inhale daily.    triamcinolone cream (KENALOG) 0.1 % Apply 1 application topically 2 (two) times daily.     aspirin 81 MG chewable tablet Chew 81 mg by mouth every morning.              Today   CHIEF COMPLAINT:  Doing much better this am   VITAL SIGNS:  Blood pressure (!) 90/42, pulse 69, temperature 98.2 F (36.8 C), temperature source Oral, resp. rate 18, height '5\' 5"'$  (1.651 m), weight 35 kg (77 lb 3.2 oz), SpO2 90 %.   REVIEW OF SYSTEMS:  Review of Systems  Constitutional: Negative.  Negative for chills, fever and malaise/fatigue.  HENT: Negative.  Negative for ear discharge, ear pain, hearing loss, nosebleeds and sore throat.   Eyes: Negative.  Negative for blurred vision and pain.  Respiratory:  Positive for cough. Negative for hemoptysis, shortness of breath and wheezing.   Cardiovascular: Negative.  Negative for chest pain, palpitations and leg swelling.  Gastrointestinal: Negative.  Negative for abdominal pain, blood in stool, diarrhea, nausea and vomiting.  Genitourinary: Negative.  Negative for dysuria.  Musculoskeletal: Negative.  Negative for back pain.  Skin: Negative.   Neurological: Negative for dizziness, tremors, speech change, focal weakness, seizures and headaches.   Endo/Heme/Allergies: Negative.  Does not bruise/bleed easily.  Psychiatric/Behavioral: Negative.  Negative for depression, hallucinations and suicidal ideas.     PHYSICAL EXAMINATION:  GENERAL:  71 y.o.-year-old patient lying in the bed with no acute distress.  NECK:  Supple, no jugular venous distention. No thyroid enlargement, no tenderness.  LUNGS: Crackles right lower base no wheezing, rales,rhonchi  No use of accessory muscles of respiration.  CARDIOVASCULAR: S1, S2 normal. No murmurs, rubs, or gallops.  ABDOMEN: Soft, non-tender, non-distended. Bowel sounds present. No organomegaly or mass.  EXTREMITIES: No pedal edema, cyanosis, or clubbing.  PSYCHIATRIC: The patient is alert and oriented x 3.  SKIN: No obvious rash, lesion, or ulcer.   DATA REVIEW:   CBC  Recent Labs Lab 02/05/16 0343  WBC 13.9*  HGB 10.9*  HCT 33.4*  PLT 297    Chemistries   Recent Labs Lab 02/05/16 0343  NA 133*  K 3.6  CL 102  CO2 26  GLUCOSE 107*  BUN 19  CREATININE 0.64  CALCIUM 7.9*    Cardiac Enzymes No results for input(s): TROPONINI in the last 168 hours.  Microbiology Results  '@MICRORSLT48'$ @  RADIOLOGY:  Dg Chest 2 View  Result Date: 02/04/2016 CLINICAL DATA:  Shortness of breath, fatigue, productive cough, history of right upper lobectomy for lung carcinoma EXAM: CHEST  2 VIEW COMPARISON:  CT chest of 10/28/2015 and chest x-ray of 07/05/2015 FINDINGS: The lungs remain very hyperaerated consistent with emphysema. There is however more opacity now in the right mid upper lung extending peripherally. Although this could represent pneumonia, recurrence of lung carcinoma cannot be excluded. CT of the chest with IV contrast media is recommended to assess further. Mediastinal and hilar contours are unremarkable. The heart is within normal limits in size. No acute bony abnormality is seen. IMPRESSION: 1. New opacity within the right mid upper lung extending peripherally. Possible  pneumonia but cannot exclude recurrence of carcinoma. Recommend CT of the chest with IV contrast media to assess further. 2. Emphysema. Electronically Signed   By: Ivar Drape M.D.   On: 02/04/2016 12:53   Ct Chest W Contrast  Result Date: 02/04/2016 CLINICAL DATA:  Cough and shortness of breath since 02/01/2016. Abnormal chest x-ray today. COPD. EXAM: CT CHEST WITH CONTRAST TECHNIQUE: Multidetector CT imaging of the chest was performed during intravenous contrast administration. CONTRAST:  50 ml ISOVUE-300 IOPAMIDOL (ISOVUE-300) INJECTION 61% COMPARISON:  PA and lateral chest this same day. CT chest 10/28/2015 FINDINGS: Cardiovascular: Heart size is normal. No pericardial effusion. Calcific aortic and coronary atherosclerosis is seen. Mediastinum/Nodes: No axillary, hilar or mediastinal lymphadenopathy. No hiatal hernia. Thyroid gland is unremarkable. Lungs/Pleura: Very small right pleural effusion is seen. Severe centrilobular emphysematous disease is present. New extensive airspace disease is present in the right lower lobe. Left upper lobe nodule consistent with the patient's known carcinoma is again seen and today measures 0.9 cm AP x 0.9 cm transverse on image 37 compared to 1.2 x 1.1 cm on the prior CT. Left basilar bronchiectasis and mucous plugging are identified. Upper Abdomen: Mild intrahepatic biliary ductal dilatation  is seen. Atherosclerosis noted. Musculoskeletal: No acute bony abnormality. No lytic or sclerotic lesion. IMPRESSION: Extensive right lower lobe airspace disease most consistent with pneumonia. Recommend plain film follow-up to clearing. Slight decrease in the size of the patient's known left upper lobe carcinoma. Small right pleural effusion. Severe emphysema. Atherosclerosis. Electronically Signed   By: Inge Rise M.D.   On: 02/04/2016 13:36      Management plans discussed with the patient and he is in agreement. Stable for discharge home with [facility]  Patient should follow  up with pcp  CODE STATUS:     Code Status Orders        Start     Ordered   02/04/16 1654  Full code  Continuous     02/04/16 1653    Code Status History    Date Active Date Inactive Code Status Order ID Comments User Context   06/24/2015  5:15 PM 06/28/2015  1:45 PM Full Code [id number]  [name], MD Inpatient   05/19/2015  4:26 PM 05/22/2015  1:29 PM Full Code [id number]  Gladstone Lighter, MD ED   06/25/2014  2:53 PM 07/02/2014  8:33 PM Full Code [id number]  Inez Catalina, MD Inpatient   06/22/2014 10:59 PM 06/25/2014  2:53 PM Full Code [id number]  Harrie Foreman, MD ED    Advance Directive Documentation   Flowsheet Row Most Recent Value  Type of Advance Directive  Healthcare Power of Attorney, Living will  Pre-existing out of facility DNR order (yellow form or pink MOST form)  No data  "MOST" Form in Place?  No data      TOTAL TIME TAKING CARE OF THIS PATIENT: 39 minutes.    Note: This dictation was prepared with Dragon dictation along with smaller phrase technology. Any transcriptional errors that result from this process are unintentional.  Anaija Wissink M.D on 02/05/2016 at 1:07 PM  Between 7am to 6pm - Pager - [phone] After 6pm go to [email or web address] - password [email or web address] [facility]  Office  [phone]  CC: Primary care physician; [name], MD

## 2016-02-09 LAB — CULTURE, BLOOD (ROUTINE X 2)
Culture: NO GROWTH
Culture: NO GROWTH

## 2016-02-10 ENCOUNTER — Encounter: Payer: Self-pay | Admitting: Nurse Practitioner

## 2016-02-10 ENCOUNTER — Inpatient Hospital Stay
Admission: AD | Admit: 2016-02-10 | Discharge: 2016-02-19 | DRG: 193 | Disposition: A | Discharge status: Hospice | Payer: PPO | Source: Ambulatory Visit | Attending: Internal Medicine | Admitting: Internal Medicine

## 2016-02-10 ENCOUNTER — Inpatient Hospital Stay: Payer: PPO

## 2016-02-10 DIAGNOSIS — J440 Chronic obstructive pulmonary disease with acute lower respiratory infection: Secondary | ICD-10-CM | POA: Diagnosis present

## 2016-02-10 DIAGNOSIS — J9621 Acute and chronic respiratory failure with hypoxia: Secondary | ICD-10-CM | POA: Diagnosis present

## 2016-02-10 DIAGNOSIS — R1310 Dysphagia, unspecified: Secondary | ICD-10-CM | POA: Diagnosis present

## 2016-02-10 DIAGNOSIS — R0602 Shortness of breath: Secondary | ICD-10-CM | POA: Diagnosis not present

## 2016-02-10 DIAGNOSIS — J189 Pneumonia, unspecified organism: Secondary | ICD-10-CM

## 2016-02-10 DIAGNOSIS — J441 Chronic obstructive pulmonary disease with (acute) exacerbation: Secondary | ICD-10-CM | POA: Diagnosis present

## 2016-02-10 DIAGNOSIS — Z7982 Long term (current) use of aspirin: Secondary | ICD-10-CM | POA: Diagnosis not present

## 2016-02-10 DIAGNOSIS — F17210 Nicotine dependence, cigarettes, uncomplicated: Secondary | ICD-10-CM | POA: Diagnosis present

## 2016-02-10 DIAGNOSIS — J154 Pneumonia due to other streptococci: Principal | ICD-10-CM | POA: Diagnosis present

## 2016-02-10 DIAGNOSIS — R778 Other specified abnormalities of plasma proteins: Secondary | ICD-10-CM

## 2016-02-10 DIAGNOSIS — R627 Adult failure to thrive: Secondary | ICD-10-CM

## 2016-02-10 DIAGNOSIS — J150 Pneumonia due to Klebsiella pneumoniae: Secondary | ICD-10-CM | POA: Diagnosis not present

## 2016-02-10 DIAGNOSIS — Z833 Family history of diabetes mellitus: Secondary | ICD-10-CM

## 2016-02-10 DIAGNOSIS — Z923 Personal history of irradiation: Secondary | ICD-10-CM

## 2016-02-10 DIAGNOSIS — I248 Other forms of acute ischemic heart disease: Secondary | ICD-10-CM | POA: Diagnosis not present

## 2016-02-10 DIAGNOSIS — R64 Cachexia: Secondary | ICD-10-CM | POA: Diagnosis present

## 2016-02-10 DIAGNOSIS — J449 Chronic obstructive pulmonary disease, unspecified: Secondary | ICD-10-CM | POA: Diagnosis not present

## 2016-02-10 DIAGNOSIS — J9611 Chronic respiratory failure with hypoxia: Secondary | ICD-10-CM | POA: Diagnosis not present

## 2016-02-10 DIAGNOSIS — M6250 Muscle wasting and atrophy, not elsewhere classified, unspecified site: Secondary | ICD-10-CM | POA: Diagnosis not present

## 2016-02-10 DIAGNOSIS — Z681 Body mass index (BMI) 19 or less, adult: Secondary | ICD-10-CM | POA: Diagnosis not present

## 2016-02-10 DIAGNOSIS — Z7951 Long term (current) use of inhaled steroids: Secondary | ICD-10-CM | POA: Diagnosis not present

## 2016-02-10 DIAGNOSIS — R748 Abnormal levels of other serum enzymes: Secondary | ICD-10-CM | POA: Diagnosis not present

## 2016-02-10 DIAGNOSIS — Z79899 Other long term (current) drug therapy: Secondary | ICD-10-CM

## 2016-02-10 DIAGNOSIS — Z8249 Family history of ischemic heart disease and other diseases of the circulatory system: Secondary | ICD-10-CM

## 2016-02-10 DIAGNOSIS — Z515 Encounter for palliative care: Secondary | ICD-10-CM | POA: Diagnosis not present

## 2016-02-10 DIAGNOSIS — C3490 Malignant neoplasm of unspecified part of unspecified bronchus or lung: Secondary | ICD-10-CM | POA: Diagnosis not present

## 2016-02-10 DIAGNOSIS — I959 Hypotension, unspecified: Secondary | ICD-10-CM | POA: Diagnosis not present

## 2016-02-10 DIAGNOSIS — K5900 Constipation, unspecified: Secondary | ICD-10-CM | POA: Diagnosis not present

## 2016-02-10 DIAGNOSIS — J432 Centrilobular emphysema: Secondary | ICD-10-CM | POA: Diagnosis not present

## 2016-02-10 DIAGNOSIS — M6281 Muscle weakness (generalized): Secondary | ICD-10-CM

## 2016-02-10 DIAGNOSIS — R0600 Dyspnea, unspecified: Secondary | ICD-10-CM | POA: Diagnosis not present

## 2016-02-10 DIAGNOSIS — R262 Difficulty in walking, not elsewhere classified: Secondary | ICD-10-CM

## 2016-02-10 DIAGNOSIS — E43 Unspecified severe protein-calorie malnutrition: Secondary | ICD-10-CM | POA: Diagnosis present

## 2016-02-10 DIAGNOSIS — R531 Weakness: Secondary | ICD-10-CM

## 2016-02-10 DIAGNOSIS — Z66 Do not resuscitate: Secondary | ICD-10-CM | POA: Diagnosis not present

## 2016-02-10 DIAGNOSIS — Z7189 Other specified counseling: Secondary | ICD-10-CM

## 2016-02-10 DIAGNOSIS — R7989 Other specified abnormal findings of blood chemistry: Secondary | ICD-10-CM

## 2016-02-10 DIAGNOSIS — C3412 Malignant neoplasm of upper lobe, left bronchus or lung: Secondary | ICD-10-CM | POA: Diagnosis not present

## 2016-02-10 HISTORY — DX: Personal history of other medical treatment: Z92.89

## 2016-02-10 LAB — COMPREHENSIVE METABOLIC PANEL
ALBUMIN: 2.2 g/dL — AB (ref 3.5–5.0)
ALT: 88 U/L — ABNORMAL HIGH (ref 17–63)
ANION GAP: 8 (ref 5–15)
AST: 60 U/L — ABNORMAL HIGH (ref 15–41)
Alkaline Phosphatase: 116 U/L (ref 38–126)
BUN: 13 mg/dL (ref 6–20)
CHLORIDE: 94 mmol/L — AB (ref 101–111)
CO2: 33 mmol/L — ABNORMAL HIGH (ref 22–32)
Calcium: 8.3 mg/dL — ABNORMAL LOW (ref 8.9–10.3)
Creatinine, Ser: 0.45 mg/dL — ABNORMAL LOW (ref 0.61–1.24)
GFR calc Af Amer: >60 mL/min (ref >60)
Glucose, Bld: 104 mg/dL — ABNORMAL HIGH (ref 65–99)
POTASSIUM: 3.6 mmol/L (ref 3.5–5.1)
Sodium: 135 mmol/L (ref 135–145)
TOTAL PROTEIN: 7.2 g/dL (ref 6.5–8.1)
Total Bilirubin: 0.6 mg/dL (ref 0.3–1.2)

## 2016-02-10 LAB — TROPONIN I
Troponin I: 0.12 ng/mL (ref <0.03)
Troponin I: 0.04 ng/mL (ref <0.03)

## 2016-02-10 LAB — CBC
HCT: 38.3 % — ABNORMAL LOW (ref 40.0–52.0)
Hemoglobin: 12.3 g/dL — ABNORMAL LOW (ref 13.0–18.0)
MCH: 25.1 pg — ABNORMAL LOW (ref 26.0–34.0)
MCHC: 32 g/dL (ref 32.0–36.0)
MCV: 78.5 fL — ABNORMAL LOW (ref 80.0–100.0)
PLATELETS: 573 10*3/uL — AB (ref 150–440)
RBC: 4.88 MIL/uL (ref 4.40–5.90)
RDW: 14.7 % — AB (ref 11.5–14.5)
WBC: 18.8 10*3/uL — AB (ref 3.8–10.6)

## 2016-02-10 LAB — EXPECTORATED SPUTUM ASSESSMENT W REFEX TO RESP CULTURE

## 2016-02-10 MED ORDER — ENOXAPARIN SODIUM 40 MG/0.4ML ~~LOC~~ SOLN
40.0000 mg | SUBCUTANEOUS | Status: DC
  Administered 2016-02-10 – 2016-02-12 (×3): 40 mg via SUBCUTANEOUS
  Filled 2016-02-10 (×3): qty 0.4

## 2016-02-10 MED ORDER — HYDROCOD POLST-CPM POLST ER 10-8 MG/5ML PO SUER
5.0000 mL | Freq: Two times a day (BID) | ORAL | Status: DC
  Administered 2016-02-10 – 2016-02-14 (×5): 5 mL via ORAL
  Filled 2016-02-10 (×5): qty 5

## 2016-02-10 MED ORDER — ACETAMINOPHEN 325 MG PO TABS
650.0000 mg | ORAL_TABLET | Freq: Four times a day (QID) | ORAL | Status: DC | PRN
  Administered 2016-02-14: 650 mg via ORAL
  Filled 2016-02-10: qty 2

## 2016-02-10 MED ORDER — METHYLPREDNISOLONE SODIUM SUCC 40 MG IJ SOLR
40.0000 mg | Freq: Every day | INTRAMUSCULAR | Status: DC
  Administered 2016-02-10 – 2016-02-11 (×2): 40 mg via INTRAVENOUS
  Filled 2016-02-10 (×2): qty 1

## 2016-02-10 MED ORDER — ALBUTEROL SULFATE (2.5 MG/3ML) 0.083% IN NEBU: 2.5000 mg | INHALATION_SOLUTION | Freq: Four times a day (QID) | RESPIRATORY_TRACT | Status: DC

## 2016-02-10 MED ORDER — BUDESONIDE 0.25 MG/2ML IN SUSP
0.2500 mg | Freq: Two times a day (BID) | RESPIRATORY_TRACT | Status: DC
  Administered 2016-02-10 – 2016-02-14 (×8): 0.25 mg via RESPIRATORY_TRACT
  Filled 2016-02-10 (×9): qty 2

## 2016-02-10 MED ORDER — NICOTINE 21 MG/24HR TD PT24
21.0000 mg | MEDICATED_PATCH | Freq: Every day | TRANSDERMAL | Status: DC
  Administered 2016-02-10 – 2016-02-19 (×9): 21 mg via TRANSDERMAL
  Filled 2016-02-10 (×10): qty 1

## 2016-02-10 MED ORDER — SODIUM CHLORIDE 0.9% FLUSH: 3.0000 mL | INTRAVENOUS | Status: DC | PRN

## 2016-02-10 MED ORDER — DEXTROSE 5 % IV SOLN
2.0000 g | Freq: Two times a day (BID) | INTRAVENOUS | Status: DC
  Administered 2016-02-10 – 2016-02-12 (×5): 2 g via INTRAVENOUS
  Filled 2016-02-10 (×6): qty 2

## 2016-02-10 MED ORDER — ONDANSETRON HCL 4 MG/2ML IJ SOLN: 4.0000 mg | Freq: Four times a day (QID) | INTRAMUSCULAR | Status: DC | PRN

## 2016-02-10 MED ORDER — TIOTROPIUM BROMIDE MONOHYDRATE 18 MCG IN CAPS
18.0000 ug | ORAL_CAPSULE | Freq: Every day | RESPIRATORY_TRACT | Status: DC
  Administered 2016-02-10 – 2016-02-14 (×5): 18 ug via RESPIRATORY_TRACT
  Filled 2016-02-10: qty 5

## 2016-02-10 MED ORDER — SODIUM CHLORIDE 0.9 % IV SOLN: 250.0000 mL | INTRAVENOUS | Status: DC | PRN

## 2016-02-10 MED ORDER — SODIUM CHLORIDE 0.9% FLUSH
3.0000 mL | Freq: Two times a day (BID) | INTRAVENOUS | Status: DC
  Administered 2016-02-10 – 2016-02-11 (×4): 3 mL via INTRAVENOUS

## 2016-02-10 MED ORDER — ACETAMINOPHEN 650 MG RE SUPP: 650.0000 mg | Freq: Four times a day (QID) | RECTAL | Status: DC | PRN

## 2016-02-10 MED ORDER — ONDANSETRON HCL 4 MG PO TABS: 4.0000 mg | ORAL_TABLET | Freq: Four times a day (QID) | ORAL | Status: DC | PRN

## 2016-02-10 MED ORDER — SODIUM CHLORIDE 0.9% FLUSH
3.0000 mL | Freq: Two times a day (BID) | INTRAVENOUS | Status: DC
  Administered 2016-02-10 – 2016-02-19 (×18): 3 mL via INTRAVENOUS

## 2016-02-10 MED ORDER — VANCOMYCIN HCL 500 MG IV SOLR
500.0000 mg | INTRAVENOUS | Status: DC
  Administered 2016-02-11 – 2016-02-12 (×2): 500 mg via INTRAVENOUS
  Filled 2016-02-10 (×2): qty 500

## 2016-02-10 MED ORDER — ASPIRIN 81 MG PO CHEW: 81.0000 mg | CHEWABLE_TABLET | ORAL | Status: DC

## 2016-02-10 MED ORDER — SODIUM CHLORIDE 0.9 % IV SOLN
INTRAVENOUS | Status: DC
  Administered 2016-02-10 – 2016-02-11 (×2): via INTRAVENOUS

## 2016-02-10 MED ORDER — GUAIFENESIN ER 600 MG PO TB12
600.0000 mg | ORAL_TABLET | Freq: Two times a day (BID) | ORAL | Status: DC
  Administered 2016-02-10 – 2016-02-14 (×10): 600 mg via ORAL
  Filled 2016-02-10 (×10): qty 1

## 2016-02-10 MED ORDER — VANCOMYCIN HCL 500 MG IV SOLR
500.0000 mg | INTRAVENOUS | Status: DC
  Filled 2016-02-10: qty 500

## 2016-02-10 MED ORDER — ENSURE ENLIVE PO LIQD
237.0000 mL | Freq: Two times a day (BID) | ORAL | Status: DC
  Administered 2016-02-10 – 2016-02-19 (×12): 237 mL via ORAL

## 2016-02-10 MED ORDER — INFLUENZA VAC SPLIT QUAD 0.5 ML IM SUSY: 0.5000 mL | PREFILLED_SYRINGE | INTRAMUSCULAR | Status: AC

## 2016-02-10 MED ORDER — ALBUTEROL SULFATE (2.5 MG/3ML) 0.083% IN NEBU
2.5000 mg | INHALATION_SOLUTION | RESPIRATORY_TRACT | Status: DC
  Administered 2016-02-10 – 2016-02-14 (×23): 2.5 mg via RESPIRATORY_TRACT
  Filled 2016-02-10 (×24): qty 3

## 2016-02-10 MED ORDER — VANCOMYCIN HCL IN DEXTROSE 750-5 MG/150ML-% IV SOLN
750.0000 mg | Freq: Once | INTRAVENOUS | Status: AC
  Administered 2016-02-10: 750 mg via INTRAVENOUS
  Filled 2016-02-10: qty 150

## 2016-02-10 MED ORDER — ASPIRIN 81 MG PO CHEW
325.0000 mg | CHEWABLE_TABLET | ORAL | Status: DC
  Administered 2016-02-10 – 2016-02-11 (×2): 324 mg via ORAL
  Filled 2016-02-10 (×2): qty 4
  Filled 2016-02-10: qty 3

## 2016-02-10 NOTE — Consult Note (Signed)
Pharmacy Antibiotic Note  Rick Clark is a 71 y.o. male admitted on 02/10/2016 with pneumonia.  Pharmacy has been consulted for vancomycin and cefepime dosing. Pt was recently admitted for PNA, tx w/ azithromycin/ceftriaxone and d/c on leveofloxacin.   Plan: Vancomycin '750mg'$  once. Give next dose in 14hr for stacked dosing Vancomycin 500 IV every 18 hours.  Goal trough 15-20 mcg/mL.  Trough prior to 5th dose 2/1 @ 1330 Cefepime 2g q 12hr  Height: '5\' 5"'$  (165.1 cm) Weight: 80 lb 8 oz (36.5 kg) IBW/kg (Calculated) : 61.5  Temp (24hrs), Avg:97.7 F (36.5 C), Min:97.7 F (36.5 C), Max:97.7 F (36.5 C)   Recent Labs Lab 02/04/16 1122 02/04/16 1707 02/04/16 2013 02/05/16 0343 02/10/16 1417  WBC 20.1*  --   --  13.9* 18.8*  CREATININE 0.80  --   --  0.64 0.45*  LATICACIDVEN  --  2.2* 1.8  --   --     Estimated Creatinine Clearance: 43.1 mL/min (by C-G formula based on SCr of 0.45 mg/dL (L)).    No Known Allergies  Antimicrobials this admission: vancomycin 1/29 >>  cefepime 1/29 >>   Dose adjustments this admission:   Microbiology results:   Thank you for allowing pharmacy to be a part of this patient's care.  Ramond Dial, Pharm.D, BCPS Clinical Pharmacist  02/10/2016 3:24 PM

## 2016-02-10 NOTE — Consult Note (Signed)
Cardiology Consult    Patient ID: Rick Clark MRN: [id number], DOB/AGE: [birth year]   Admit date: 02/10/2016 Date of Consult: 02/10/2016  Primary Physician: [name], MD Primary Cardiologist: New Requesting Provider: Seth Bake, MD  Patient Profile    71 y/o ? w/o a h/o lung cancer s/p RUL thoracotomy, spiculated LUL mass, tob abuse, copd, chronic dyspnea, and recent PNA, who was readmitted to [facility] 1/29 with ongoing dyspnea, hypoxia, cough, and was noted to have an elevated troponin.   Past Medical History   Past Medical History:  Diagnosis Date  . Asthma   . COPD (chronic obstructive pulmonary disease) ([place])   . H/O cardiovascular stress test    a. 02/2015 Myoview: no ischemia/infarct, EF 55-65%.  . Lung cancer [facility]) 1990   Patient states RUL Thoracotomy.  . Lung mass    a. spiculated LUL lung mass;  b. 01/2016 Chest CT: LUL nodule 0.9 x 0.9 cm - improved from 1.2 x 1.1 cm.  . Pneumonia    a. 11/2013 with admission 01/2016 as well.    Past Surgical History:  Procedure Laterality Date  . FLEXIBLE BRONCHOSCOPY Bilateral 09/18/2014   Procedure: FLEXIBLE BRONCHOSCOPY;  Surgeon: Allyne Gee, MD;  Location: [facility] ORS;  Service: Pulmonary;  Laterality: Bilateral;  . LUNG LOBECTOMY Right    Right upper lobe     Allergies  No Known Allergies  History of Present Illness    71 y/o ? with the above complex PMH including longstanding tobacco abuse, COPD, lung cancer s/p R thoracotomy, LUL nodule, and recent pna.  He was admitted to [facility] 1/23 with dyspnea, cough, leukocytosis, tachycardia, and PNA.  He was tx with abx and subsequently d/c'd a day later (1/24).  He says that following discharge, he continued to have progressively worsening dyspnea, malaise, and non-productive cough.  He has also had constant, mild bilat chest discomfort that is worse with coughing and position changes.  He quit smoking.  He has been compliant with abx.  Today, he saw his pulmonologist and was  hypoxic.  He was directly admitted for further eval.  CXR shows worsening right sided pna with new left basilar infiltrate.  Lab eval shows mild troponin elevation @ 0.12.  We've been asked to eval. He currently denies c/p.  ECG is non-acute.  Inpatient Medications    . albuterol  2.5 mg Nebulization Q4H  . aspirin  325 mg Oral BH-q7a  . budesonide (PULMICORT) nebulizer solution  0.25 mg Nebulization BID  . ceFEPime (MAXIPIME) IV  2 g Intravenous Q12H  . chlorpheniramine-HYDROcodone  5 mL Oral Q12H  . enoxaparin (LOVENOX) injection  40 mg Subcutaneous Q24H  . feeding supplement (ENSURE ENLIVE)  237 mL Oral BID BM  . guaiFENesin  600 mg Oral BID  . methylPREDNISolone (SOLU-MEDROL) injection  40 mg Intravenous Daily  . nicotine  21 mg Transdermal Daily  . sodium chloride flush  3 mL Intravenous Q12H  . sodium chloride flush  3 mL Intravenous Q12H  . tiotropium  18 mcg Inhalation Daily  . [START ON 02/11/2016] vancomycin  500 mg Intravenous Q18H  . vancomycin  750 mg Intravenous Once    Family History    Family History  Problem Relation Age of Onset  . Coronary artery disease Father   . Diabetes Mother     Social History    Social History   Social History  . Marital status: Single    Spouse name: N/A  . Number of children:  N/A  . Years of education: N/A   Occupational History  . Not on file.   Social History Main Topics  . Smoking status: Current Every Day Smoker    Packs/day: 0.50    Years: 55.00    Types: Cigarettes  . Smokeless tobacco: Never Used  . Alcohol use No  . Drug use: No  . Sexual activity: Yes   Other Topics Concern  . Not on file   Social History Narrative   Lives at home with granddaughter.   Very weak     Review of Systems    General:  No chills, fever, night sweats or weight changes.  Cardiovascular:  +++ bilat chest pain - worse with coughing/deep breathing/position changes.  +++ dyspnea on exertion, no edema, orthopnea, palpitations,  paroxysmal nocturnal dyspnea. Dermatological: No rash, lesions/masses Respiratory: +++ non-productive cough, +++ dyspnea Urologic: No hematuria, dysuria Abdominal:   No nausea, vomiting, diarrhea, bright red blood per rectum, melena, or hematemesis Neurologic:  No visual changes, wkns, changes in mental status. All other systems reviewed and are otherwise negative except as noted above.  Physical Exam    Blood pressure (!) 99/59, pulse 96, temperature 97.7 F (36.5 C), resp. rate (!) 30, height '5\' 5"'$  (1.651 m), weight 80 lb 8 oz (36.5 kg), SpO2 93 %.  General: Pleasant, NAD.  Frail. Psych: Normal affect. Neuro: Alert and oriented X 3. Moves all extremities spontaneously. HEENT: Normal  Neck: Supple without bruits or JVD. Lungs:  Resp regular and unlabored, coarse bilat breath sounds/rhonchi.  Coughing frequently. Heart: RRR no s3, s4, or murmurs. Abdomen: Soft, non-tender, non-distended, BS + x 4.  Extremities: No clubbing, cyanosis or edema. DP/PT/Radials 2+ and equal bilaterally.  Labs     Recent Labs  02/10/16 1417  TROPONINI 0.12*   Lab Results  Component Value Date   WBC 18.8 (H) 02/10/2016   HGB 12.3 (L) 02/10/2016   HCT 38.3 (L) 02/10/2016   MCV 78.5 (L) 02/10/2016   PLT 573 (H) 02/10/2016     Recent Labs Lab 02/10/16 1417  NA 135  K 3.6  CL 94*  CO2 33*  BUN 13  CREATININE 0.45*  CALCIUM 8.3*  PROT 7.2  BILITOT 0.6  ALKPHOS 116  ALT 88*  AST 60*  GLUCOSE 104*    Radiology Studies    X-ray Chest Pa And Lateral  Result Date: 02/10/2016 CLINICAL DATA:  Shortness of breath and chest congestion. History of pneumonia. EXAM: CHEST  2 VIEW COMPARISON:  02/04/2016 FINDINGS: Background pattern of advanced emphysema. Previous lobectomy on the right. Chronic markings in the left lung. I think there is development of some infiltrate now seen in the left lower lobe. On the right, extensive consolidation in the right midlung and lower lung has worsened further.  Small amount of pleural fluid on the right. IMPRESSION: Worsening of extensive pneumonia in the right mid and lower lung. New development of patchy infiltrate at the left base. Background emphysema. Electronically Signed   By: Nelson Chimes M.D.   On: 02/10/2016 14:54   Dg Chest 2 View  Result Date: 02/04/2016 CLINICAL DATA:  Shortness of breath, fatigue, productive cough, history of right upper lobectomy for lung carcinoma EXAM: CHEST  2 VIEW COMPARISON:  CT chest of 10/28/2015 and chest x-ray of 07/05/2015 FINDINGS: The lungs remain very hyperaerated consistent with emphysema. There is however more opacity now in the right mid upper lung extending peripherally. Although this could represent pneumonia, recurrence of lung carcinoma cannot be  excluded. CT of the chest with IV contrast media is recommended to assess further. Mediastinal and hilar contours are unremarkable. The heart is within normal limits in size. No acute bony abnormality is seen. IMPRESSION: 1. New opacity within the right mid upper lung extending peripherally. Possible pneumonia but cannot exclude recurrence of carcinoma. Recommend CT of the chest with IV contrast media to assess further. 2. Emphysema. Electronically Signed   By: Ivar Drape M.D.   On: 02/04/2016 12:53   Ct Chest W Contrast  Result Date: 02/04/2016 CLINICAL DATA:  Cough and shortness of breath since 02/01/2016. Abnormal chest x-ray today. COPD. EXAM: CT CHEST WITH CONTRAST TECHNIQUE: Multidetector CT imaging of the chest was performed during intravenous contrast administration. CONTRAST:  50 ml ISOVUE-300 IOPAMIDOL (ISOVUE-300) INJECTION 61% COMPARISON:  PA and lateral chest this same day. CT chest 10/28/2015 FINDINGS: Cardiovascular: Heart size is normal. No pericardial effusion. Calcific aortic and coronary atherosclerosis is seen. Mediastinum/Nodes: No axillary, hilar or mediastinal lymphadenopathy. No hiatal hernia. Thyroid gland is unremarkable. Lungs/Pleura: Very  small right pleural effusion is seen. Severe centrilobular emphysematous disease is present. New extensive airspace disease is present in the right lower lobe. Left upper lobe nodule consistent with the patient's known carcinoma is again seen and today measures 0.9 cm AP x 0.9 cm transverse on image 37 compared to 1.2 x 1.1 cm on the prior CT. Left basilar bronchiectasis and mucous plugging are identified. Upper Abdomen: Mild intrahepatic biliary ductal dilatation is seen. Atherosclerosis noted. Musculoskeletal: No acute bony abnormality. No lytic or sclerotic lesion. IMPRESSION: Extensive right lower lobe airspace disease most consistent with pneumonia. Recommend plain film follow-up to clearing. Slight decrease in the size of the patient's known left upper lobe carcinoma. Small right pleural effusion. Severe emphysema. Atherosclerosis. Electronically Signed   By: Inge Rise M.D.   On: 02/04/2016 13:36    ECG & Cardiac Imaging    Rsr, 82, no acute st/t changes.  Assessment & Plan    1.  Acute on chronic hypoxic respiratory failure/Bilateral PNA:  Recently admitted with right sided pna and treated with abx.  D/c'd after 24 hrs last week but he continued to feel poorly and in fact worsened with increasing dyspnea, malaise, nonproductive cough, pleuritic chest pain, and weakness.  He saw his pcp today and was hypoxic, thus he was directly admitted.  CXR today shows RML, RLL PNA with new left basilar infiltrate.  Abx/inhlaers per IM.  2.  Elevated troponin:  In the setting of #1, troponin is mildly elevated @ 0.12.  He has had some mild, constant, pleuritic chest pain over the past week.  ECG is non-acute.  Doubt ACS.  Much more likely demand ischemia in the setting of resp failure and pna.  Previously nl myoview in 02/2015.  If troponin trend remains flat and echo shows nl EF, would not pursue further ischemic eval at this time.  Reduce ASA to 81 mg daily.  F/u lipids.  No  blocker in setting of resp  failure req steroids.  3.  Tob Abuse:  He says that now is as good a time as any to quit.  He hasn't had a cigarette in 1 wk.  He's been smoking since the age of 67.  I congratulated him on avoiding cigarettes and encouraged him to remain off of them.  4.  Lung Cancer: followed by oncology as outpt.  Recent CT showed slight reduction in size of LUL nodule.  Signed, Murray Hodgkins, NP 02/10/2016, 4:49 PM

## 2016-02-10 NOTE — H&P (Addendum)
[place] [facility] [place] NAME: Rick Clark    MR#:  [id number]  DATE OF BIRTH:  [birth year]  DATE OF ADMISSION:  02/10/2016  PRIMARY CARE PHYSICIAN: [name], MD   REQUESTING/REFERRING PHYSICIAN:   CHIEF COMPLAINT:  No chief complaint on file.   HISTORY OF PRESENT ILLNESS: Rick Clark  is a 71 y.o. male with a known history of COPD, chronic respiratory failure, lung cancer, status post right upper thoracotomy, new. Hospitalist related left upper lobe lung mass, which is improving in size according to most recent CT scan about a week ago, history of recent admission to the hospital for pneumonia, was treated with Rocephin and Zithromax in the hospital and discharged on levofloxacin, comes back to the hospital with significant shortness of breath, not feeling well, very weak, not able to lift up phlegm, rattling in the chest. One to patient, he improved in the hospital when he stated from the 27 to 02/05/2016 on antibiotic therapy, however, over the past 2 or 3 days. He's been having significant worsening of his condition, he is having a runny nose, dry mouth, pains in his shoulders, rattling in the chest, wheezing, shortness of breath, inefficient cough read. He was seen by Dr. Humphrey Rolls, his primary pulmonologist, who sent him as direct admit to the hospital.  PAST MEDICAL HISTORY:   Past Medical History:  Diagnosis Date  . Asthma   . COPD (chronic obstructive pulmonary disease) ([place])   . Lung cancer [facility]) 1990   Patient states RUL Thoracotomy.  . Lung mass    new spiculated LUL lung mass  . Pneumonia Nov '15  . Shortness of breath dyspnea     PAST SURGICAL HISTORY: Past Surgical History:  Procedure Laterality Date  . FLEXIBLE BRONCHOSCOPY Bilateral 09/18/2014   Procedure: FLEXIBLE BRONCHOSCOPY;  Surgeon: Allyne Gee, MD;  Location: [facility] ORS;  Service: Pulmonary;  Laterality: Bilateral;  . LUNG LOBECTOMY Right    Right upper  lobe    SOCIAL HISTORY:  Social History  Substance Use Topics  . Smoking status: Current Every Day Smoker    Packs/day: 0.50    Years: 55.00    Types: Cigarettes  . Smokeless tobacco: Never Used  . Alcohol use No    FAMILY HISTORY:  Family History  Problem Relation Age of Onset  . Coronary artery disease Father   . Diabetes Mother     DRUG ALLERGIES: No Known Allergies  Review of Systems  Constitutional: Positive for chills and malaise/fatigue. Negative for fever and weight loss.  HENT: Negative for congestion.   Eyes: Negative for blurred vision and double vision.  Respiratory: Positive for cough, shortness of breath and wheezing. Negative for sputum production.   Cardiovascular: Positive for chest pain. Negative for palpitations, orthopnea, leg swelling and PND.  Gastrointestinal: Negative for abdominal pain, blood in stool, constipation, diarrhea, nausea and vomiting.  Genitourinary: Negative for dysuria, frequency, hematuria and urgency.  Musculoskeletal: Negative for falls.  Neurological: Positive for weakness. Negative for dizziness, tremors, focal weakness and headaches.  Endo/Heme/Allergies: Does not bruise/bleed easily.  Psychiatric/Behavioral: Negative for depression. The patient does not have insomnia.     MEDICATIONS AT HOME:  Prior to Admission medications   Medication Sig Start Date End Date Taking? Authorizing Provider  albuterol (PROVENTIL HFA;VENTOLIN HFA) 108 (90 BASE) MCG/ACT inhaler Inhale 1 puff into the lungs every 6 (six) hours as needed for wheezing or shortness of breath.    Historical Provider, MD  aspirin 81 MG chewable tablet Chew 81 mg by mouth every morning.    Historical Provider, MD  budesonide-formoterol (SYMBICORT) 160-4.5 MCG/ACT inhaler Inhale 2 puffs into the lungs 2 (two) times daily.    Historical Provider, MD  clotrimazole-betamethasone (LOTRISONE) cream Apply 1 application topically 2 (two) times daily.  11/12/15   Historical  Provider, MD  feeding supplement, ENSURE ENLIVE, (ENSURE ENLIVE) LIQD Take 237 mLs by mouth 2 (two) times daily between meals. 02/05/16   Bettey Costa, MD  levofloxacin (LEVAQUIN) 750 MG tablet Take 1 tablet (750 mg total) by mouth daily. 02/05/16   Bettey Costa, MD  tiotropium (SPIRIVA) 18 MCG inhalation capsule Place 18 mcg into inhaler and inhale daily.    Historical Provider, MD  triamcinolone cream (KENALOG) 0.1 % Apply 1 application topically 2 (two) times daily.  11/12/15   Historical Provider, MD      PHYSICAL EXAMINATION:   VITAL SIGNS: Blood pressure (!) 99/59, pulse 96, temperature 97.7 F (36.5 C), resp. rate (!) 30, SpO2 93 %.  GENERAL:  71 y.o.-year-old patient lying in the bed In moderate respiratory distress, coughing nonstop, not able to lift any phlegm, very weak, inefficient cough and breathing, tachypneic, uncomfortable rattling in the chest from the distance short of breath. Gurgling in the throat while talking EYES: Pupils equal, round, reactive to light and accommodation. No scleral icterus. Extraocular muscles intact.  HEENT: Head atraumatic, normocephalic. Oropharynx and nasopharynx clear.  NECK:  Supple, no jugular venous distention. No thyroid enlargement, no tenderness.  LUNGS: Some diminished breath sounds bilaterally, no wheezing, but bilateral copious rales,rhonchi and crepitations, some dullness to percussion on the right. Intermittent use of accessory muscles of respiration, inefficient cough and wheezing, tachypneic, low volume breathing.  CARDIOVASCULAR: S1, S2 normal. No murmurs, rubs, or gallops.  ABDOMEN: Moderately firm due to intermittent cough, nontender, nondistended. Bowel sounds present. No organomegaly or mass.  EXTREMITIES: Trace lower extremity and pedal edema,  no cyanosis, or clubbing. Cold hands and feet to touch NEUROLOGIC: Cranial nerves II through XII are intact. Muscle strength 5/5 in all extremities. Sensation intact. Gait not checked.   PSYCHIATRIC: The patient is alert and oriented x 3.  SKIN: No obvious rash, lesion, or ulcer.   LABORATORY PANEL:   CBC  Recent Labs Lab 02/04/16 1122 02/05/16 0343  WBC 20.1* 13.9*  HGB 13.9 10.9*  HCT 42.4 33.4*  PLT 395 297  MCV 77.3* 77.4*  MCH 25.4* 25.4*  MCHC 32.9 32.8  RDW 14.3 13.9   ------------------------------------------------------------------------------------------------------------------  Chemistries   Recent Labs Lab 02/05/16 0343  NA 133*  K 3.6  CL 102  CO2 26  GLUCOSE 107*  BUN 19  CREATININE 0.64  CALCIUM 7.9*   ------------------------------------------------------------------------------------------------------------------  Cardiac Enzymes No results for input(s): TROPONINI in the last 168 hours. ------------------------------------------------------------------------------------------------------------------  RADIOLOGY: No results found.  EKG: Orders placed or performed during the hospital encounter of 02/10/16  . EKG 12-Lead  . EKG 12-Lead  Pending  IMPRESSION AND PLAN:  Active Problems:   Pneumonia   Acute on chronic respiratory failure with hypoxia (HCC)   Hypotension   Generalized weakness  #1. Acute on chronic respiratory failure with hypoxia, continue oxygen therapy, get Dr. Laurelyn Sickle input, get chest x-ray, labs, blood cultures, sputum cultures, initiate patient on broad-spectrum antibiotic therapy, initiate on nebulizing therapy,, get respiratory therapist to see patient in consultation for chest physiotherapy #2. Pneumonia, broad-spectrum antibiotic therapy to cover healthcare associated pneumonia, get sputum cultures if possible #3. Hypotension, initiate patient on  IV fluids #4. Generalized weakness, get physical therapist to see patient in consultation 5. Dysphagia, change diet to dysphagia 2, get speech therapist evaluate patient as soon as possible, modified barium swallow study as needed   All the records are  reviewed and case discussed with ED provider. Management plans discussed with the patient, family and they are in agreement.  CODE STATUS:    Code Status Orders        Start     Ordered   02/10/16 1404  Full code  Continuous     02/10/16 1403    Code Status History    Date Active Date Inactive Code Status Order ID Comments User Context   02/04/2016  4:53 PM 02/05/2016  7:57 PM Full Code [id number]  Demetrios Loll, MD Inpatient   06/24/2015  5:15 PM 06/28/2015  1:45 PM Full Code [id number]  [name], MD Inpatient   05/19/2015  4:26 PM 05/22/2015  1:29 PM Full Code [id number]  Gladstone Lighter, MD ED   06/25/2014  2:53 PM 07/02/2014  8:33 PM Full Code [id number]  Inez Catalina, MD Inpatient   06/22/2014 10:59 PM 06/25/2014  2:53 PM Full Code [id number]  Harrie Foreman, MD ED       TOTAL Critical care TIME TAKING CARE OF THIS PATIENT: 50 minutes.    Theodoro Grist M.D on 02/10/2016 at 2:30 PM  Between 7am to 6pm - Pager - [phone] After 6pm go to [email or web address] - password [email or web address] [facility]  Office  [phone]  CC: Primary care physician; [name], MD

## 2016-02-10 NOTE — Progress Notes (Signed)
Patient transferred from Dr. Laurelyn Sickle office as a direct admit. Vitals taken, BP running soft. Family states that patient's oxygen saturation at the office was 79% on room air. Patient placed on 2L of oxygen, saturation 91%. Patient is having difficulty breathing with audible rhonchi. No complaints of pain, states he "just feels lousy all over". Dr. Ether Griffins is assigned to patient, paged. Awaiting orders.

## 2016-02-10 NOTE — Evaluation (Signed)
Clinical/Bedside Swallow Evaluation Patient Details  Name: Rick Clark MRN: [id number] Date of Birth: 1945-05-08  Today's Date: 02/10/2016 Time: SLP Start Time (ACUTE ONLY): 13 SLP Stop Time (ACUTE ONLY): 1630 SLP Time Calculation (min) (ACUTE ONLY): 60 min  Past Medical History:  Past Medical History:  Diagnosis Date  . Asthma   . COPD (chronic obstructive pulmonary disease) ([place])   . H/O cardiovascular stress test    a. 02/2015 Myoview: no ischemia/infarct, EF 55-65%.  . Lung cancer [facility]) 1990   Patient states RUL Thoracotomy.  . Lung mass    a. spiculated LUL lung mass;  b. 01/2016 Chest CT: LUL nodule 0.9 x 0.9 cm - improved from 1.2 x 1.1 cm.  . Pneumonia    a. 11/2013 with admission 01/2016 as well.   Past Surgical History:  Past Surgical History:  Procedure Laterality Date  . FLEXIBLE BRONCHOSCOPY Bilateral 09/18/2014   Procedure: FLEXIBLE BRONCHOSCOPY;  Surgeon: Allyne Gee, MD;  Location: [facility] ORS;  Service: Pulmonary;  Laterality: Bilateral;  . LUNG LOBECTOMY Right    Right upper lobe   HPI:  Pt is 71 y.o. male with a known history of COPD, chronic respiratory failure, lung cancer, status post right upper thoracotomy, new. Hospitalist related left upper lobe lung mass, which is improving in size according to most recent CT scan about a week ago, history of recent admission to the hospital for pneumonia, was treated with Rocephin and Zithromax in the hospital and discharged on levofloxacin, comes back to the hospital with significant shortness of breath, not feeling well, very weak, not able to lift up phlegm, rattling in the chest. One to patient, he improved in the hospital when he stated from the 27 to 02/05/2016 on antibiotic therapy, however, over the past 2 or 3 days. He's been having significant worsening of his condition, he is having a runny nose, dry mouth, pains in his shoulders, rattling in the chest, wheezing, shortness of breath, inefficient cough read. He  was seen by Dr. Humphrey Rolls, his primary pulmonologist, who sent him as direct admit to the hospital. Currently, pt appears to tolerate dysphagia 2 diet with thin liquids with no overt s/sx of aspiration. Due to pts cognitve status and and need for rest breaks during meals, supervision is recommended.    Assessment / Plan / Recommendation Clinical Impression  Pt appeared to tolerate trials of thin liquids via cup and puree/soft solid foods w/ no immediate overt s/s of aspiration noted; no decline in respiratory status or  immediate change in vocal quality( wet quality at baseline intermittedly with declined pulmonary status overall). Pt consumed trials feeding self w/ no significant oral phase deficits noted. Mild, light coughing noted intermittly throughout session but did not appear to increase with po trials given.Pts basline pulmonary status has been quite declined and congested per pt and family. Pt stated he monitors his drinking and eating "carefully".  Educated family and pt on general aspiration precautions (sitting upright at meals, reducing distractions, taking small sips and bites and taking frequent rest breaks during meals). Recommend dysphagia diet level 2 with thin liquids this date following aspiration precautions. St services to monitor with possible need for objective swallow study if needed for further assessment.         Aspiration Risk  Mild aspiration risk (/moderate aspiration risk)    Diet Recommendation   Recommend dysphagia level 2 diet with thin liquids following aspiration precautions (NO STRAWS). Meds given crushed in puree.   Medication Administration:  Crushed with puree (as per families request/report)    Other  Recommendations Oral Care Recommendations: Oral care BID;Staff/trained caregiver to provide oral care   Follow up Recommendations  SNF vs Home Health TBD     Frequency and Duration 3x week x2 weeks       Prognosis Prognosis for Safe Diet Advancement:  Good-Fair     Swallow Study   General Date of Onset: 02/10/16 HPI: Pt is 71 y.o. male with a known history of COPD, chronic respiratory failure, lung cancer, status post right upper thoracotomy, new. Hospitalist related left upper lobe lung mass, which is improving in size according to most recent CT scan about a week ago, history of recent admission to the hospital for pneumonia, was treated with Rocephin and Zithromax in the hospital and discharged on levofloxacin, comes back to the hospital with significant shortness of breath, not feeling well, very weak, not able to lift up phlegm, rattling in the chest. One to patient, he improved in the hospital when he stated from the 27 to 02/05/2016 on antibiotic therapy, however, over the past 2 or 3 days. He's been having significant worsening of his condition, he is having a runny nose, dry mouth, pains in his shoulders, rattling in the chest, wheezing, shortness of breath, inefficient cough read. He was seen by Dr. Humphrey Rolls, his primary pulmonologist, who sent him as direct admit to the hospital. Currently, pt appears to tolerate dysphagia 2 diet with thin liquids with no overt s/sx of aspiration. Due to pts cognitve status and and need for rest breaks during meals, supervision is recommended.  Type of Study: Bedside Swallow Evaluation Previous Swallow Assessment: 05/20/15 Diet Prior to this Study: Regular;Thin liquids (at home; dys 2 with nectar upon dis-admission) Temperature Spikes Noted: No Respiratory Status: Room air History of Recent Intubation: No Behavior/Cognition: Alert;Cooperative;Pleasant mood Oral Cavity Assessment: Within Functional Limits Oral Care Completed by SLP: Recent completion by staff Oral Cavity - Dentition: Missing dentition Vision: Functional for self-feeding Self-Feeding Abilities: Able to feed self;Needs set up;Needs assist Patient Positioning: Upright in bed;Postural control adequate for testing Baseline Vocal Quality:  Normal;Wet;Low vocal intensity Volitional Cough: Weak;Congested Volitional Swallow:  (dnt)    Oral/Motor/Sensory Function Overall Oral Motor/Sensory Function: Within functional limits   Ice Chips Ice chips: Not tested Other Comments: NSG reports of ice chips 30 minutes prior with no coughing or strangling   Thin Liquid Thin Liquid: Within functional limits Presentation: Cup (no straw; X5 trials)    Nectar Thick Nectar Thick Liquid: Not tested   Honey Thick Honey Thick Liquid: Not tested   Puree Puree: Within functional limits Presentation: Self Fed;Spoon (X4 trials)   Solid   GO   Solid: Within functional limits Presentation: Self Fed;Spoon (soft solids X5 trials)        Eulogio Ditch, B.S Graduate Clinician 02/10/2016,4:49 PM   This information has been reviewed and agreed upon by this supervising clinician.  [name], [place], CCC-SLP

## 2016-02-10 NOTE — Progress Notes (Signed)
First troponin resulted at 0.12. Dr. Ether Griffins notified. MD stated to place cardiology consult and change aspirin to '324mg'$ . Consult placed and secretary notified.

## 2016-02-11 ENCOUNTER — Inpatient Hospital Stay (HOSPITAL_COMMUNITY)
Admission: AD | Admit: 2016-02-11 | Discharge: 2016-02-11 | Disposition: A | Discharge status: Home / Self Care | Payer: PPO | Source: Ambulatory Visit | Attending: Nurse Practitioner | Admitting: Nurse Practitioner

## 2016-02-11 DIAGNOSIS — R7989 Other specified abnormal findings of blood chemistry: Secondary | ICD-10-CM

## 2016-02-11 DIAGNOSIS — R531 Weakness: Secondary | ICD-10-CM

## 2016-02-11 DIAGNOSIS — J9621 Acute and chronic respiratory failure with hypoxia: Secondary | ICD-10-CM

## 2016-02-11 DIAGNOSIS — R778 Other specified abnormalities of plasma proteins: Secondary | ICD-10-CM

## 2016-02-11 DIAGNOSIS — J189 Pneumonia, unspecified organism: Secondary | ICD-10-CM

## 2016-02-11 DIAGNOSIS — R0600 Dyspnea, unspecified: Secondary | ICD-10-CM

## 2016-02-11 LAB — URINALYSIS, COMPLETE (UACMP) WITH MICROSCOPIC
BACTERIA UA: NONE SEEN
BILIRUBIN URINE: NEGATIVE
Glucose, UA: 50 mg/dL — AB
Hgb urine dipstick: NEGATIVE
Ketones, ur: 5 mg/dL — AB
LEUKOCYTES UA: NEGATIVE
Nitrite: NEGATIVE
PH: 5 (ref 5.0–8.0)
Protein, ur: 30 mg/dL — AB
SPECIFIC GRAVITY, URINE: 1.031 — AB (ref 1.005–1.030)
SQUAMOUS EPITHELIAL / LPF: NONE SEEN

## 2016-02-11 LAB — ECHOCARDIOGRAM COMPLETE
HEIGHTINCHES: 65 in
Weight: 1288 oz

## 2016-02-11 LAB — CBC
HEMATOCRIT: 32 % — AB (ref 40.0–52.0)
Hemoglobin: 11 g/dL — ABNORMAL LOW (ref 13.0–18.0)
MCH: 26.3 pg (ref 26.0–34.0)
MCHC: 34.4 g/dL (ref 32.0–36.0)
MCV: 76.6 fL — ABNORMAL LOW (ref 80.0–100.0)
PLATELETS: 484 10*3/uL — AB (ref 150–440)
RBC: 4.18 MIL/uL — ABNORMAL LOW (ref 4.40–5.90)
RDW: 14.6 % — AB (ref 11.5–14.5)
WBC: 13.1 10*3/uL — ABNORMAL HIGH (ref 3.8–10.6)

## 2016-02-11 LAB — BASIC METABOLIC PANEL
Anion gap: 5 (ref 5–15)
BUN: 17 mg/dL (ref 6–20)
CO2: 33 mmol/L — ABNORMAL HIGH (ref 22–32)
Calcium: 8 mg/dL — ABNORMAL LOW (ref 8.9–10.3)
Chloride: 97 mmol/L — ABNORMAL LOW (ref 101–111)
Creatinine, Ser: 0.44 mg/dL — ABNORMAL LOW (ref 0.61–1.24)
GFR calc Af Amer: >60 mL/min (ref >60)
Glucose, Bld: 197 mg/dL — ABNORMAL HIGH (ref 65–99)
POTASSIUM: 3.6 mmol/L (ref 3.5–5.1)
SODIUM: 135 mmol/L (ref 135–145)

## 2016-02-11 LAB — TROPONIN I: Troponin I: 0.07 ng/mL (ref <0.03)

## 2016-02-11 LAB — GLUCOSE, CAPILLARY
GLUCOSE-CAPILLARY: 163 mg/dL — AB (ref 65–99)
Glucose-Capillary: 119 mg/dL — ABNORMAL HIGH (ref 65–99)

## 2016-02-11 MED ORDER — ASPIRIN EC 81 MG PO TBEC
81.0000 mg | DELAYED_RELEASE_TABLET | Freq: Every day | ORAL | Status: DC
  Administered 2016-02-12 – 2016-02-14 (×3): 81 mg via ORAL
  Filled 2016-02-11 (×3): qty 1

## 2016-02-11 MED ORDER — PREDNISONE 50 MG PO TABS
50.0000 mg | ORAL_TABLET | Freq: Every day | ORAL | Status: DC
Start: 2016-02-12 — End: 2016-02-14
  Administered 2016-02-12 – 2016-02-14 (×3): 50 mg via ORAL
  Filled 2016-02-11 (×3): qty 1

## 2016-02-11 NOTE — Progress Notes (Signed)
ACP  Discussed with patient and daughter at bedside. His healthcare power of attorney.   Discussed patient's diagnosis, treatment plan and overall prognosis.  He understands he is acutely ill with bilateral pneumonia. Has had prolonged smoking history but quit one week back. Encouraged to stay away from smoking. Patient should still remain full code but not on prolonged artificial life support.   Time spent 20 minutes

## 2016-02-11 NOTE — Progress Notes (Signed)
Initial Nutrition Assessment  DOCUMENTATION CODES:   Severe malnutrition in context of chronic illness, Underweight  INTERVENTION:  1. Continue Ensure Enlive po BID, each supplement provides 350 kcal and 20 grams of protein 2. NDD3 - thin per SLP  NUTRITION DIAGNOSIS:   Malnutrition related to chronic illness, other (see comment) (Lung Cancer) as evidenced by severe depletion of muscle mass, severe depletion of body fat, percent weight loss.  GOAL:   Patient will meet greater than or equal to 90% of their needs  MONITOR:   PO intake, Supplement acceptance, Labs, Weight trends, I & O's  REASON FOR ASSESSMENT:   Malnutrition Screening Tool    ASSESSMENT:   Rick Clark  is a 71 y.o. male with a known history of COPD, chronic respiratory failure, lung cancer, status post right upper thoracotomy, new. Hospitalist related left upper lobe lung mass, which is improving in size according to most recent CT scan about a week ago  Rick Clark returns with pneumonia, Acute on Chronic Respiratory failure. PO intake PTA was ok, he claims he eats 4-6 small meals per day (this includes snacks) - did not go into detail about specific foods. Weight has hovered around 80-81# since June Was eating tilapia, mashed potatoes, and greens during my visit. Documented PO intake thus far: 75-100% Consuming 1 - 2 Ensure per day at home. Most recent CT scan shows lung mass is improving - but patient still has increased energy needs, protein needs. Nutrition-Focused physical exam completed. Findings are severe fat depletion, severe muscle depletion, and no edema.  Denies nausea/vomiting/constipation/diarrhea  Labs and medications reviewed: CBGs 119-163 Solumedrol, Vancomycin  NS @ 24m/hr  Diet Order:  DIET DYS 3 Room service appropriate? Yes with Assist; Fluid consistency: Thin  Skin:  Reviewed, no issues  Last BM:  02/06/2016  Height:   Ht Readings from Last 1 Encounters:  02/10/16  '5\' 5"'$  (1.651 m)    Weight:   Wt Readings from Last 1 Encounters:  02/10/16 80 lb 8 oz (36.5 kg)    Ideal Body Weight:  61.81 kg  BMI:  Body mass index is 13.4 kg/m.  Estimated Nutritional Needs:   Kcal:  1225-1400 (35-40 cal/kg)  Protein:  54-72 gm  Fluid:  >/= 1.2L  EDUCATION NEEDS:   No education needs identified at this time  Rick Anis Tamilyn Lupien, MS, RD LDN Inpatient Clinical Dietitian Pager [phone]

## 2016-02-11 NOTE — Care Management (Addendum)
Admitted with respiratory failure due to pneumonia and copd..  02 requirements are acute.  Patient lives with his grand daugther and was independent in his adls. Cardiology and pulmonology consulting.  Patient does have history of lung cancer.. PT consult is pending.  Patient would be agreeable to home health if needed. Will need to be assess for home 02

## 2016-02-11 NOTE — Progress Notes (Addendum)
We'll    Patient Name: Rick Clark Date of Encounter: 02/11/2016  Primary Cardiologist: New to [facility] - consult by [facility] Problem List     Active Problems:   Pneumonia   Acute on chronic respiratory failure with hypoxia ([place])   Hypotension   Generalized weakness     Subjective   Breathing better this morning. Echo showed normal EF. WBC improving.   Inpatient Medications    Scheduled Meds: . albuterol  2.5 mg Nebulization Q4H  . aspirin  325 mg Oral BH-q7a  . budesonide (PULMICORT) nebulizer solution  0.25 mg Nebulization BID  . ceFEPime (MAXIPIME) IV  2 g Intravenous Q12H  . chlorpheniramine-HYDROcodone  5 mL Oral Q12H  . enoxaparin (LOVENOX) injection  40 mg Subcutaneous Q24H  . feeding supplement (ENSURE ENLIVE)  237 mL Oral BID BM  . guaiFENesin  600 mg Oral BID  . Influenza vac split quadrivalent PF  0.5 mL Intramuscular Tomorrow-1000  . methylPREDNISolone (SOLU-MEDROL) injection  40 mg Intravenous Daily  . nicotine  21 mg Transdermal Daily  . sodium chloride flush  3 mL Intravenous Q12H  . sodium chloride flush  3 mL Intravenous Q12H  . tiotropium  18 mcg Inhalation Daily  . vancomycin  500 mg Intravenous Q18H   Continuous Infusions: . sodium chloride 50 mL/hr at 02/10/16 1606   PRN Meds: sodium chloride, acetaminophen **OR** acetaminophen, ondansetron **OR** ondansetron (ZOFRAN) IV, sodium chloride flush   Vital Signs    Vitals:   02/10/16 1957 02/11/16 0011 02/11/16 0344 02/11/16 0421  BP:   (!) 109/52   Pulse:   66   Resp:   16   Temp:   97.4 F (36.3 C)   TempSrc:   Oral   SpO2: 96% 96% 99% 97%  Weight:      Height:        Intake/Output Summary (Last 24 hours) at 02/11/16 0848 Last data filed at 02/11/16 0818  Gross per 24 hour  Intake             1055 ml  Output              600 ml  Net              455 ml   Filed Weights   02/10/16 1341 02/10/16 1502  Weight: 80 lb 9 oz (36.5 kg) 80 lb 8 oz (36.5 kg)    Physical Exam    GEN: Frail appearing, in no acute distress.  HEENT: Grossly normal.  Neck: Supple, no JVD, carotid bruits, or masses. Cardiac: RRR, no murmurs, rubs, or gallops. No clubbing, cyanosis, edema.  Radials/DP/PT 2+ and equal bilaterally.  Respiratory:  Bilateral coarse breath sounds with diffuse rhonchi. GI: Soft, nontender, nondistended, BS + x 4. MS: no deformity or atrophy. Skin: warm and dry, no rash. Neuro:  Strength and sensation are intact. Psych: AAOx3.  Normal affect.  Labs    CBC  Recent Labs  02/10/16 1417 02/11/16 0208  WBC 18.8* 13.1*  HGB 12.3* 11.0*  HCT 38.3* 32.0*  MCV 78.5* 76.6*  PLT 573* 536*   Basic Metabolic Panel  Recent Labs  02/10/16 1417 02/11/16 0208  NA 135 135  K 3.6 3.6  CL 94* 97*  CO2 33* 33*  GLUCOSE 104* 197*  BUN 13 17  CREATININE 0.45* 0.44*  CALCIUM 8.3* 8.0*   Liver Function Tests  Recent Labs  02/10/16 1417  AST 60*  ALT 88*  ALKPHOS 116  BILITOT 0.6  PROT 7.2  ALBUMIN 2.2*   No results for input(s): LIPASE, AMYLASE in the last 72 hours. Cardiac Enzymes  Recent Labs  02/10/16 1417 02/10/16 2004 02/11/16 0208  TROPONINI 0.12* 0.04* 0.07*   BNP Invalid input(s): POCBNP D-Dimer No results for input(s): DDIMER in the last 72 hours. Hemoglobin A1C No results for input(s): HGBA1C in the last 72 hours. Fasting Lipid Panel No results for input(s): CHOL, HDL, LDLCALC, TRIG, CHOLHDL, LDLDIRECT in the last 72 hours. Thyroid Function Tests No results for input(s): TSH, T4TOTAL, T3FREE, THYROIDAB in the last 72 hours.  Invalid input(s): FREET3  Telemetry    Sinus rhythm, 70's bpm - Personally Reviewed  ECG    n/a - Personally Reviewed  Radiology    X-ray Chest Pa And Lateral  Result Date: 02/10/2016 CLINICAL DATA:  Shortness of breath and chest congestion. History of pneumonia. EXAM: CHEST  2 VIEW COMPARISON:  02/04/2016 FINDINGS: Background pattern of advanced emphysema. Previous lobectomy on the right.  Chronic markings in the left lung. I think there is development of some infiltrate now seen in the left lower lobe. On the right, extensive consolidation in the right midlung and lower lung has worsened further. Small amount of pleural fluid on the right. IMPRESSION: Worsening of extensive pneumonia in the right mid and lower lung. New development of patchy infiltrate at the left base. Background emphysema. Electronically Signed   By: Nelson Chimes M.D.   On: 02/10/2016 14:54    Cardiac Studies   Echo 02/11/2016: Study Conclusions  - Left ventricle: The cavity size was normal. Wall thickness was   normal. Systolic function was normal. The estimated ejection   fraction was in the range of 60% to 65%. Wall motion was normal;   there were no regional wall motion abnormalities. - Right ventricle: The cavity size was mildly dilated. Systolic   function was mildly reduced. - Inferior vena cava: The vessel was normal in size. The   respirophasic diameter changes were in the normal range (= 50%),   consistent with normal central venous pressure. - Pericardium, extracardiac: A small pericardial effusion was   identified.  Impressions:  - Challenging image quality.  Patient Profile     71 y.o. male with history of lung cancer s/p RUL thoracotomy, spiculated LUL mass, tob abuse, copd, chronic dyspnea, and recent PNA, who was readmitted to [facility] 1/29 with ongoing dyspnea, hypoxia, cough, and was noted to have a mildly elevated and flat-trending troponin.   Assessment & Plan    1. Acute on chronic respiratory failure with hypoxia/bilateral PNA/pleuritic chest pain: -Breathing improving -Remains on oxygen, not on home oxygen supplementation  -ABX/inhalers/steroids per IM  2. Elevated troponin: -Mildly elevated with a peak of 0.12, now down trending -Likely supply demand ischemia in the setting of the above -Echo with normal EF -Recent stress test 02/2015 negative -No plans for further  inpatient cardiac work up -ASA 81 mg -No beta blocker in the setting of #1  3. Tobacco abuse: -Reports no cigarettes since 7 days prior -Continued cessation is advised  4. Lung cancer: -Followed by oncology  Signed, Christell Faith, PA-C [place] Pager: [phone] 02/11/2016, 8:48 AM     Attending Note Patient seen and examined, agree with detailed note above,  Patient presentation and plan discussed on rounds.   No significant events overnight Denies any significant chest pain, still with significant thick cough, mildly productive Echocardiogram reviewed by myself showing normal LV function. Very poor images likely  secondary to low weight, difficulty imaging between his ribs  On physical exam he has coarse lung sounds bilaterally, heart sounds regular with no murmurs appreciated Abdomen thin, nontender, no JVD, no significant lower extremity edema  Lab work reviewed showing normal BMP, CBC  --- Acute on chronic respiratory failure with hypoxia/bilateral PNA/pleuritic chest pain: ABX/inhalers/steroids per IM Agree with plan above No plan for ischemia workup at this time, Prior stress test February 2017 He may benefit from nebulizers  --Elevated troponin Secondary to underlying respiratory distress , hypoxia  this is not a NSTEMI  Greater than 50% was spent in counseling and coordination of care with patient Total encounter time 25 minutes or more   Signed: Esmond Plants  M.D., Ph.D. [facility]

## 2016-02-11 NOTE — Progress Notes (Signed)
[place] [facility] [place] NAME: Rick Clark    MR#:  [id number]  DATE OF BIRTH:  [birth year]  SUBJECTIVE:  CHIEF COMPLAINT:  No chief complaint on file.  Continues to have cough with thick sputum. Shortness of breath. Afebrile.  REVIEW OF SYSTEMS:    Review of Systems  Constitutional: Positive for malaise/fatigue. Negative for chills and fever.  HENT: Negative for sore throat.   Eyes: Negative for blurred vision, double vision and pain.  Respiratory: Positive for cough, sputum production, shortness of breath and wheezing. Negative for hemoptysis.   Cardiovascular: Negative for chest pain, palpitations, orthopnea and leg swelling.  Gastrointestinal: Negative for abdominal pain, constipation, diarrhea, heartburn, nausea and vomiting.  Genitourinary: Negative for dysuria and hematuria.  Musculoskeletal: Negative for back pain and joint pain.  Skin: Negative for rash.  Neurological: Positive for weakness. Negative for sensory change, speech change, focal weakness and headaches.  Endo/Heme/Allergies: Does not bruise/bleed easily.  Psychiatric/Behavioral: Negative for depression. The patient is not nervous/anxious.     DRUG ALLERGIES:  No Known Allergies  VITALS:  Blood pressure (!) 106/45, pulse 67, temperature 97.6 F (36.4 C), temperature source Oral, resp. rate 16, height '5\' 5"'$  (1.651 m), weight 36.5 kg (80 lb 8 oz), SpO2 94 %.  PHYSICAL EXAMINATION:   Physical Exam  GENERAL:  71 y.o.-year-old patient lying in the bed. frail EYES: Pupils equal, round, reactive to light and accommodation. No scleral icterus. Extraocular muscles intact.  HEENT: Head atraumatic, normocephalic. Oropharynx and nasopharynx clear.  NECK:  Supple, no jugular venous distention. No thyroid enlargement, no tenderness.  LUNGS: increased work of breathing. Bilateral coarse breath sounds with wheezing. CARDIOVASCULAR: S1, S2 normal. No murmurs, rubs, or gallops.   ABDOMEN: Soft, nontender, nondistended. Bowel sounds present. No organomegaly or mass.  EXTREMITIES: No cyanosis, clubbing or edema b/l.    NEUROLOGIC: Cranial nerves II through XII are intact. No focal Motor or sensory deficits b/l.   PSYCHIATRIC: The patient is alert and oriented x 3.  SKIN: No obvious rash, lesion, or ulcer.   LABORATORY PANEL:   CBC  Recent Labs Lab 02/11/16 0208  WBC 13.1*  HGB 11.0*  HCT 32.0*  PLT 484*   ------------------------------------------------------------------------------------------------------------------ Chemistries   Recent Labs Lab 02/10/16 1417 02/11/16 0208  NA 135 135  K 3.6 3.6  CL 94* 97*  CO2 33* 33*  GLUCOSE 104* 197*  BUN 13 17  CREATININE 0.45* 0.44*  CALCIUM 8.3* 8.0*  AST 60*  --   ALT 88*  --   ALKPHOS 116  --   BILITOT 0.6  --    ------------------------------------------------------------------------------------------------------------------  Cardiac Enzymes  Recent Labs Lab 02/11/16 0208  TROPONINI 0.07*   ------------------------------------------------------------------------------------------------------------------  RADIOLOGY:  X-ray Chest Pa And Lateral  Result Date: 02/10/2016 CLINICAL DATA:  Shortness of breath and chest congestion. History of pneumonia. EXAM: CHEST  2 VIEW COMPARISON:  02/04/2016 FINDINGS: Background pattern of advanced emphysema. Previous lobectomy on the right. Chronic markings in the left lung. I think there is development of some infiltrate now seen in the left lower lobe. On the right, extensive consolidation in the right midlung and lower lung has worsened further. Small amount of pleural fluid on the right. IMPRESSION: Worsening of extensive pneumonia in the right mid and lower lung. New development of patchy infiltrate at the left base. Background emphysema. Electronically Signed   By: Nelson Chimes M.D.   On: 02/10/2016 14:54   ASSESSMENT AND PLAN:   #  Acute on chronic  respiratory failure with hypoxia due to bilateral pneumonia and COPD exacerbation -IV steroids, Antibiotics - Scheduled Nebulizers - Inhalers -Wean O2 as tolerated - Consult pulmonary. Await input  # Mild elevation in troponin due to demand ischemia. Not MI. Appreciate cardiology input.  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  # Hypotension Improved with IV fluids.  # Generalized weakness, get physical therapist to see patient in consultation  # Dysphagia Due to weakness. Speech therapy evaluation. On dysphagia diet. Aspiration precautions. High risk for aspiration.  All the records are reviewed and case discussed with Care Management/Social Workerr. Management plans discussed with the patient, family and they are in agreement.  CODE STATUS: FULL CODE  DVT Prophylaxis: SCDs  TOTAL TIME TAKING CARE OF THIS PATIENT: 35 minutes.   POSSIBLE D/C IN 1-2 DAYS, DEPENDING ON CLINICAL CONDITION.  [name] R M.D on 02/11/2016 at 3:56 PM  Between 7am to 6pm - Pager - [phone]  After 6pm go to [email or web address] - password [email or web address] [place] [facility]  Office  [phone]  CC: Primary care physician; [name], MD  Note: This dictation was prepared with Dragon dictation along with smaller phrase technology. Any transcriptional errors that result from this process are unintentional.

## 2016-02-11 NOTE — Progress Notes (Signed)
*  PRELIMINARY RESULTS* Echocardiogram 2D Echocardiogram has been performed.  Sherrie Sport 02/11/2016, 7:35 AM

## 2016-02-12 LAB — STREP PNEUMONIAE URINARY ANTIGEN: STREP PNEUMO URINARY ANTIGEN: POSITIVE — AB

## 2016-02-12 LAB — GLUCOSE, CAPILLARY: Glucose-Capillary: 91 mg/dL (ref 65–99)

## 2016-02-12 MED ORDER — CEFEPIME HCL 2 G IJ SOLR: 2.0000 g | INTRAMUSCULAR | Status: DC

## 2016-02-12 MED ORDER — SENNA 8.6 MG PO TABS
1.0000 | ORAL_TABLET | Freq: Every day | ORAL | Status: DC
  Administered 2016-02-12 – 2016-02-13 (×2): 8.6 mg via ORAL
  Filled 2016-02-12 (×3): qty 1

## 2016-02-12 MED ORDER — CEFEPIME HCL 2 G IJ SOLR
2.0000 g | Freq: Two times a day (BID) | INTRAMUSCULAR | Status: DC
  Administered 2016-02-12 – 2016-02-16 (×9): 2 g via INTRAVENOUS
  Filled 2016-02-12 (×12): qty 2

## 2016-02-12 MED ORDER — DEXTROSE 5 % IV SOLN: 1.0000 g | INTRAVENOUS | Status: DC

## 2016-02-12 NOTE — Care Management (Signed)
Discussed assessing for home 02.  Patient 's 02 requirements increased up to 6 liters overnight - now but now at 4 liters.  Patient has very congested cough.  Physical therapy is recommending outpatient cardiac rehab. Will question whether lung works would be the more appropriate program

## 2016-02-12 NOTE — Evaluation (Signed)
Physical Therapy Evaluation Patient Details Name: Rick Clark MRN: [id number] DOB: March 01, 1945 Today's Date: 02/12/2016   History of Present Illness  Pt admitted w/ acute respiratory failure and hypoxia and found to have R lower and middle lobe pneumonia. Pt has history of L Lung cancer and COPD    Clinical Impression  Pt awake, alert, responsive to commands and demonstrated good safety awareness throughout eval. BP was assessed in supine (97/37), sitting (102/38), and in standing (95/41), pt stated BP is normally low and he displayed no signs of orthostatic hypotension. Pt remained on 2 L/min of O2 and was at 94% in supine. O2 was monitored throughout eval and stayed above 88%. Pt currently independent w/ bed mobility and transfers and was able to safely ambulate around nursing station (200') w/o AD under PT supervision. Overall Pt tolerated ambulation well with minor increase in work of breathing and coughing, he was able to perform ambulation w/ head turns and walk backwards w/o LOB. Pt was encouraged to ambulate with nursing staff to further improve cardiopulmonary health but does not require skilled PT at this time. Nursing staff notified of mobility status. Recommend Pt transition to outpatient pulmonary rehab upon discharge from hospital to further improve cardiopulmonary function.     Follow Up Recommendations Other (comment) (Outpatient pulmonary rehab)    Equipment Recommendations       Recommendations for Other Services       Precautions / Restrictions Precautions Precautions: Fall Restrictions Weight Bearing Restrictions: No      Mobility  Bed Mobility Overal bed mobility: Independent             General bed mobility comments: safely moves from supine to sitting   Transfers Overall transfer level: Independent Equipment used: None             General transfer comment: good use of UE for push off, no signs of instability or increase in breathing    Ambulation/Gait Ambulation/Gait assistance: Min guard Ambulation Distance (Feet): 200 Feet Assistive device: None Gait Pattern/deviations: WFL(Within Functional Limits)     General Gait Details: safe ambulation w/ no LOB or staggering, slighly slow gait speed that patient states is normal at baseline, able to perform head turns and backward walking w/o LOB  Stairs            Wheelchair Mobility    Modified Rankin (Stroke Patients Only)       Balance Overall balance assessment: Independent                                           Pertinent Vitals/Pain Pain Assessment: No/denies pain    Home Living Family/patient expects to be discharged to:: Private residence Living Arrangements: Children Available Help at Discharge: Family;Available 24 hours/day Type of Home: House Home Access: Stairs to enter Entrance Stairs-Rails: None Entrance Stairs-Number of Steps: 2 Home Layout: One level Home Equipment: None      Prior Function Level of Independence: Independent         Comments: Pt still working job as part Orthoptist and very active with ambulation without AD     Hand Dominance        Extremity/Trunk Assessment   Upper Extremity Assessment Upper Extremity Assessment: Overall WFL for tasks assessed    Lower Extremity Assessment Lower Extremity Assessment: Overall WFL for tasks assessed  Communication   Communication: No difficulties  Cognition Arousal/Alertness: Awake/alert Behavior During Therapy: WFL for tasks assessed/performed Overall Cognitive Status: Within Functional Limits for tasks assessed                      General Comments General comments (skin integrity, edema, etc.): Able to perform head turns during ambulation and walk backwards w/o LOB and w/o AD    Exercises     Assessment/Plan    PT Assessment Patent does not need any further PT services  PT Problem List            PT  Treatment Interventions      PT Goals (Current goals can be found in the Care Plan section)  Acute Rehab PT Goals Patient Stated Goal: to go home PT Goal Formulation: With patient Time For Goal Achievement: 02/26/16 Potential to Achieve Goals: Good (should be safe for dc as medical issues resolve)    Frequency     Barriers to discharge        Co-evaluation               End of Session Equipment Utilized During Treatment: Gait belt;Oxygen Activity Tolerance: Patient tolerated treatment well Patient left: in chair;with chair alarm set;with family/visitor present;with call bell/phone within reach Nurse Communication: Mobility status (and O2 status)         Time: 4327-6147 PT Time Calculation (min) (ACUTE ONLY): 25 min   Charges:         PT G Codes:        [employer] Student PT 02/12/2016, 11:17 AM

## 2016-02-12 NOTE — Progress Notes (Signed)
[place] [facility] [place] NAME: Rick Clark    MR#:  [id number]  DATE OF BIRTH:  11-06-1945  SUBJECTIVE:  CHIEF COMPLAINT:  No chief complaint on file.  Still has thick sputum. Shortness of breath present. Feels weak.  REVIEW OF SYSTEMS:    Review of Systems  Constitutional: Positive for malaise/fatigue. Negative for chills and fever.  HENT: Negative for sore throat.   Eyes: Negative for blurred vision, double vision and pain.  Respiratory: Positive for cough, sputum production, shortness of breath and wheezing. Negative for hemoptysis.   Cardiovascular: Negative for chest pain, palpitations, orthopnea and leg swelling.  Gastrointestinal: Negative for abdominal pain, constipation, diarrhea, heartburn, nausea and vomiting.  Genitourinary: Negative for dysuria and hematuria.  Musculoskeletal: Negative for back pain and joint pain.  Skin: Negative for rash.  Neurological: Positive for weakness. Negative for sensory change, speech change, focal weakness and headaches.  Endo/Heme/Allergies: Does not bruise/bleed easily.  Psychiatric/Behavioral: Negative for depression. The patient is not nervous/anxious.     DRUG ALLERGIES:  No Known Allergies  VITALS:  Blood pressure (!) 103/41, pulse 66, temperature 97.4 F (36.3 C), temperature source Oral, resp. rate 12, height '5\' 5"'$  (1.651 m), weight 36.5 kg (80 lb 8 oz), SpO2 97 %.  PHYSICAL EXAMINATION:   Physical Exam  GENERAL:  71 y.o.-year-old patient lying in the bed. frail EYES: Pupils equal, round, reactive to light and accommodation. No scleral icterus. Extraocular muscles intact.  HEENT: Head atraumatic, normocephalic. Oropharynx and nasopharynx clear.  NECK:  Supple, no jugular venous distention. No thyroid enlargement, no tenderness.  LUNGS: increased work of breathing. Bilateral coarse breath sounds with wheezing. CARDIOVASCULAR: S1, S2 normal. No murmurs, rubs, or gallops.   ABDOMEN: Soft, nontender, nondistended. Bowel sounds present. No organomegaly or mass.  EXTREMITIES: No cyanosis, clubbing or edema b/l.    NEUROLOGIC: Cranial nerves II through XII are intact. No focal Motor or sensory deficits b/l.   PSYCHIATRIC: The patient is alert and oriented x 3.  SKIN: No obvious rash, lesion, or ulcer.   LABORATORY PANEL:   CBC  Recent Labs Lab 02/11/16 0208  WBC 13.1*  HGB 11.0*  HCT 32.0*  PLT 484*   ------------------------------------------------------------------------------------------------------------------ Chemistries   Recent Labs Lab 02/10/16 1417 02/11/16 0208  NA 135 135  K 3.6 3.6  CL 94* 97*  CO2 33* 33*  GLUCOSE 104* 197*  BUN 13 17  CREATININE 0.45* 0.44*  CALCIUM 8.3* 8.0*  AST 60*  --   ALT 88*  --   ALKPHOS 116  --   BILITOT 0.6  --    ------------------------------------------------------------------------------------------------------------------  Cardiac Enzymes  Recent Labs Lab 02/11/16 0208  TROPONINI 0.07*   ------------------------------------------------------------------------------------------------------------------  RADIOLOGY:  X-ray Chest Pa And Lateral  Result Date: 02/10/2016 CLINICAL DATA:  Shortness of breath and chest congestion. History of pneumonia. EXAM: CHEST  2 VIEW COMPARISON:  02/04/2016 FINDINGS: Background pattern of advanced emphysema. Previous lobectomy on the right. Chronic markings in the left lung. I think there is development of some infiltrate now seen in the left lower lobe. On the right, extensive consolidation in the right midlung and lower lung has worsened further. Small amount of pleural fluid on the right. IMPRESSION: Worsening of extensive pneumonia in the right mid and lower lung. New development of patchy infiltrate at the left base. Background emphysema. Electronically Signed   By: Nelson Chimes M.D.   On: 02/10/2016 14:54   ASSESSMENT AND PLAN:   # Acute  on chronic  respiratory failure with hypoxia due to bilateral pneumonia and COPD exacerbation -IV steroids, Antibiotics - Scheduled Nebulizers - Inhalers -Wean O2 as tolerated - Consulted pulmonary. Await input Streptococcus pneumonia antigen positive and urine. We'll stop vancomycin. Continue cefepime.  # Mild elevation in troponin due to demand ischemia. Not MI. Appreciate cardiology input.  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  # Hypotension Improved with IV fluids. IV fluids stopped  # Generalized weakness Physical therapy to see  # Dysphagia Due to weakness. Speech therapy evaluation. On dysphagia diet. Aspiration precautions. High risk for aspiration.  All the records are reviewed and case discussed with Care Management/Social Workerr. Management plans discussed with the patient, family and they are in agreement.  CODE STATUS: FULL CODE  DVT Prophylaxis: SCDs  TOTAL TIME TAKING CARE OF THIS PATIENT: 35 minutes.   POSSIBLE D/C IN 1-2 DAYS, DEPENDING ON CLINICAL CONDITION.  [name] R M.D on 02/12/2016 at 1:02 PM  Between 7am to 6pm - Pager - [phone]  After 6pm go to [email or web address] - password [email or web address] [facility]  Office  [phone]  CC: Primary care physician; [name], MD  Note: This dictation was prepared with Dragon dictation along with smaller phrase technology. Any transcriptional errors that result from this process are unintentional.

## 2016-02-12 NOTE — Progress Notes (Signed)
Speech Language Pathology Treatment: Dysphagia  Patient Details Name: Rick Clark MRN: [id number] DOB: 03/17/1945 Today's Date: 02/12/2016 Time: 0930-1010 SLP Time Calculation (min) (ACUTE ONLY): 40 min  Assessment / Plan / Recommendation Clinical Impression  Pt appears to be tolerating his current diet consistency w/ no immediate, overt s/s of aspiration; no continued decline in pulmonary status per pt/family/NSG/MD with improved phlegm and congestion in the past 2 days per pt. O2 needs and WBC are improving per labs in chart. No overt s/sx of aspiration were noted during po trials w/ pt, and no significant oral phase deficits noted. Pt continues to use his general aspiration precautions including small, single sips and bites - NO straws. Discussed pt's status w/ MD who agreed. Pt is participating in Pulmonary txs to help reduce (baseline) phlegm and stated he felt "better" today. Recommend pt continue with current diet following general aspiration precautions (NO STRAWS); Pills in Puree for easier, safer swallowing. Recommend frequent rest breaks to avoid any WOB/WOB. ST services will continue to monitor pt's toleration of diet for s/s of aspiration, Pulmonary decline.     HPI HPI: Pt is 71 y.o. male with a known history of COPD, chronic respiratory failure, lung cancer, status post right upper thoracotomy, new. Hospitalist related left upper lobe lung mass, which is improving in size according to most recent CT scan about a week ago, history of recent admission to the hospital for pneumonia, was treated with Rocephin and Zithromax in the hospital and discharged on levofloxacin, comes back to the hospital with significant shortness of breath, not feeling well, very weak, not able to lift up phlegm, rattling in the chest. One to patient, he improved in the hospital when he stated from the 27 to 02/05/2016 on antibiotic therapy, however, over the past 2 or 3 days. He's been having significant  worsening of his condition, he is having a runny nose, dry mouth, pains in his shoulders, rattling in the chest, wheezing, shortness of breath, inefficient cough read. He was seen by Dr. Humphrey Rolls, his primary pulmonologist, who sent him as direct admit to the hospital. Currently, pt appears to tolerate dysphagia 3 diet with meats well-chopped/ground w/ gravy, thin liquids with no overt s/sx of aspiration. Due to pts cognitve status and and need for rest breaks during meals, supervision is recommended.       SLP Plan  Continue with current plan of care     Recommendations  Diet recommendations: Dysphagia 3 (mechanical soft);Thin liquid (meats well-chopped, ground w/ gravy ) Liquids provided via: Cup;No straw Medication Administration: Crushed with puree Supervision: Patient able to self feed (tray setup) Compensations: Minimize environmental distractions;Slow rate;Small sips/bites;Lingual sweep for clearance of pocketing;Multiple dry swallows after each bite/sip;Follow solids with liquid Postural Changes and/or Swallow Maneuvers: Seated upright 90 degrees;Upright 30-60 min after meal                General recommendations:  (Dietician) Oral Care Recommendations: Oral care BID;Staff/trained caregiver to provide oral care Follow up Recommendations: None (TBD) Plan: Continue with current plan of care       GO                Urvi Imes 02/12/2016, 2:32 PM

## 2016-02-12 NOTE — Progress Notes (Signed)
Patient has rested quietly this shift with family at bedside. VSS, no distress. Stating he is starting to feel a little better. Continuing to require continuous O2. No complaints. Will continue to monitor.

## 2016-02-13 LAB — CULTURE, RESPIRATORY W GRAM STAIN: Culture: NORMAL

## 2016-02-13 LAB — LEGIONELLA PNEUMOPHILA SEROGP 1 UR AG: L. PNEUMOPHILA SEROGP 1 UR AG: NEGATIVE

## 2016-02-13 LAB — GLUCOSE, CAPILLARY: Glucose-Capillary: 91 mg/dL (ref 65–99)

## 2016-02-13 MED ORDER — POLYETHYLENE GLYCOL 3350 17 G PO PACK
17.0000 g | PACK | Freq: Every day | ORAL | Status: DC
  Administered 2016-02-13: 17 g via ORAL
  Filled 2016-02-13: qty 1

## 2016-02-13 MED ORDER — ZOLPIDEM TARTRATE 5 MG PO TABS
5.0000 mg | ORAL_TABLET | Freq: Every evening | ORAL | Status: DC | PRN
Start: 2016-02-13 — End: 2016-02-15
  Administered 2016-02-14: 5 mg via ORAL
  Filled 2016-02-13 (×2): qty 1

## 2016-02-13 MED ORDER — ORAL CARE MOUTH RINSE
15.0000 mL | Freq: Two times a day (BID) | OROMUCOSAL | Status: DC
  Administered 2016-02-14 – 2016-02-19 (×7): 15 mL via OROMUCOSAL

## 2016-02-13 MED ORDER — POLYETHYLENE GLYCOL 3350 17 G PO PACK
17.0000 g | PACK | Freq: Two times a day (BID) | ORAL | Status: DC
  Administered 2016-02-13: 17 g via ORAL
  Filled 2016-02-13: qty 1

## 2016-02-13 MED ORDER — ENOXAPARIN SODIUM 30 MG/0.3ML ~~LOC~~ SOLN
30.0000 mg | SUBCUTANEOUS | Status: DC
  Administered 2016-02-13 – 2016-02-18 (×6): 30 mg via SUBCUTANEOUS
  Filled 2016-02-13 (×6): qty 0.3

## 2016-02-13 NOTE — Progress Notes (Signed)
Rick Clark at [place] NAME: Rick Clark    MR#:  [id number]  DATE OF BIRTH:  03/21/1945  SUBJECTIVE:  CHIEF COMPLAINT:  No chief complaint on file.  Intake yellow sputum. No blood. Continues to have shortness of breath. Feels weak. Constipated  REVIEW OF SYSTEMS:    Review of Systems  Constitutional: Positive for malaise/fatigue. Negative for chills and fever.  HENT: Negative for sore throat.   Eyes: Negative for blurred vision, double vision and pain.  Respiratory: Positive for cough, sputum production, shortness of breath and wheezing. Negative for hemoptysis.   Cardiovascular: Negative for chest pain, palpitations, orthopnea and leg swelling.  Gastrointestinal: Negative for abdominal pain, constipation, diarrhea, heartburn, nausea and vomiting.  Genitourinary: Negative for dysuria and hematuria.  Musculoskeletal: Negative for back pain and joint pain.  Skin: Negative for rash.  Neurological: Positive for weakness. Negative for sensory change, speech change, focal weakness and headaches.  Endo/Heme/Allergies: Does not bruise/bleed easily.  Psychiatric/Behavioral: Negative for depression. The patient is not nervous/anxious.     DRUG ALLERGIES:  No Known Allergies  VITALS:  Blood pressure (!) 107/54, pulse 80, temperature 98.2 F (36.8 C), temperature source Oral, resp. rate 16, height '5\' 5"'$  (1.651 m), weight 36.5 kg (80 lb 8 oz), SpO2 96 %.  PHYSICAL EXAMINATION:   Physical Exam  GENERAL:  71 y.o.-year-old patient lying in the bed. frail EYES: Pupils equal, round, reactive to light and accommodation. No scleral icterus. Extraocular muscles intact.  HEENT: Head atraumatic, normocephalic. Oropharynx and nasopharynx clear.  NECK:  Supple, no jugular venous distention. No thyroid enlargement, no tenderness.  LUNGS: increased work of breathing. Bilateral coarse breath sounds with wheezing. CARDIOVASCULAR: S1, S2 normal. No  murmurs, rubs, or gallops.  ABDOMEN: Soft, nontender, nondistended. Bowel sounds present. No organomegaly or mass.  EXTREMITIES: No cyanosis, clubbing or edema b/l.    NEUROLOGIC: Cranial nerves II through XII are intact. No focal Motor or sensory deficits b/l.   PSYCHIATRIC: The patient is alert and oriented x 3.  SKIN: No obvious rash, lesion, or ulcer.   LABORATORY PANEL:   CBC  Recent Labs Lab 02/11/16 0208  WBC 13.1*  HGB 11.0*  HCT 32.0*  PLT 484*   ------------------------------------------------------------------------------------------------------------------ Chemistries   Recent Labs Lab 02/10/16 1417 02/11/16 0208  NA 135 135  K 3.6 3.6  CL 94* 97*  CO2 33* 33*  GLUCOSE 104* 197*  BUN 13 17  CREATININE 0.45* 0.44*  CALCIUM 8.3* 8.0*  AST 60*  --   ALT 88*  --   ALKPHOS 116  --   BILITOT 0.6  --    ------------------------------------------------------------------------------------------------------------------  Cardiac Enzymes  Recent Labs Lab 02/11/16 0208  TROPONINI 0.07*   ------------------------------------------------------------------------------------------------------------------  RADIOLOGY:  No results found. ASSESSMENT AND PLAN:   # Acute on chronic respiratory failure with hypoxia due to bilateral pneumonia and COPD exacerbation -IV steroids, Antibiotics - Scheduled Nebulizers - Inhalers -Wean O2 as tolerated - Consulted pulmonary. Discussed with Dr. Humphrey Rolls who will see the patient today. Streptococcus pneumonia antigen positive and urine. We'll stop vancomycin. Continue cefepime.  # Mild elevation in troponin due to demand ischemia. Not MI. Appreciate cardiology input.  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  # Hypotension Improved with IV fluids. stopped  # Generalized weakness Physical therapy to see  # Dysphagia Due to weakness. Speech therapy evaluation. On dysphagia  diet. Aspiration precautions. High risk for aspiration.  All the records are reviewed and case discussed with Care Management/Social Workerr. Management plans discussed with the patient, family and they are in agreement.  CODE STATUS: FULL CODE  DVT Prophylaxis: SCDs  TOTAL TIME TAKING CARE OF THIS PATIENT: 35 minutes.   POSSIBLE D/C IN 1-2 DAYS, DEPENDING ON CLINICAL CONDITION.  [name] R M.D on 02/13/2016 at 12:28 PM  Between 7am to 6pm - Pager - [phone]  After 6pm go to [email or web address] - password [email or web address] [facility]  Office  [phone]  CC: Primary care physician; [name], MD  Note: This dictation was prepared with Dragon dictation along with smaller phrase technology. Any transcriptional errors that result from this process are unintentional.

## 2016-02-13 NOTE — Care Management (Signed)
Patient requiring chest PT to loosen lung secretions so can be expelled.  Have reached out to [employer] to investigate criteria and coverage.  He also does not have a home nebulizer and may benefit.  have requested home 02 assessment again.

## 2016-02-13 NOTE — Progress Notes (Signed)
Patient has rested quietly today. Able to cough up some phlegm today, feeling a little better this afternoon. Remains dyspneic on exertion. Family at bedside most of the day. Will continue to monitor.

## 2016-02-14 ENCOUNTER — Inpatient Hospital Stay: Payer: PPO

## 2016-02-14 DIAGNOSIS — R64 Cachexia: Secondary | ICD-10-CM

## 2016-02-14 DIAGNOSIS — J432 Centrilobular emphysema: Secondary | ICD-10-CM

## 2016-02-14 LAB — CBC
HCT: 33.4 % — ABNORMAL LOW (ref 40.0–52.0)
Hemoglobin: 11.2 g/dL — ABNORMAL LOW (ref 13.0–18.0)
MCH: 25.4 pg — ABNORMAL LOW (ref 26.0–34.0)
MCHC: 33.4 g/dL (ref 32.0–36.0)
MCV: 75.9 fL — AB (ref 80.0–100.0)
PLATELETS: 577 10*3/uL — AB (ref 150–440)
RBC: 4.4 MIL/uL (ref 4.40–5.90)
RDW: 14.7 % — AB (ref 11.5–14.5)
WBC: 21.6 10*3/uL — AB (ref 3.8–10.6)

## 2016-02-14 LAB — PROCALCITONIN: PROCALCITONIN: 0.12 ng/mL

## 2016-02-14 LAB — CREATININE, SERUM

## 2016-02-14 LAB — INFLUENZA PANEL BY PCR (TYPE A & B)
INFLAPCR: NEGATIVE
INFLBPCR: NEGATIVE

## 2016-02-14 LAB — GLUCOSE, CAPILLARY
GLUCOSE-CAPILLARY: 98 mg/dL (ref 65–99)
Glucose-Capillary: 128 mg/dL — ABNORMAL HIGH (ref 65–99)

## 2016-02-14 LAB — MRSA PCR SCREENING: MRSA BY PCR: NEGATIVE

## 2016-02-14 MED ORDER — VANCOMYCIN HCL 500 MG IV SOLR
500.0000 mg | INTRAVENOUS | Status: DC
  Administered 2016-02-15 – 2016-02-16 (×2): 500 mg via INTRAVENOUS
  Filled 2016-02-14 (×4): qty 500

## 2016-02-14 MED ORDER — VANCOMYCIN HCL IN DEXTROSE 1-5 GM/200ML-% IV SOLN
1000.0000 mg | Freq: Once | INTRAVENOUS | Status: AC
  Administered 2016-02-14: 1000 mg via INTRAVENOUS
  Filled 2016-02-14: qty 200

## 2016-02-14 MED ORDER — ALBUTEROL SULFATE (2.5 MG/3ML) 0.083% IN NEBU
2.5000 mg | INHALATION_SOLUTION | Freq: Four times a day (QID) | RESPIRATORY_TRACT | Status: DC
  Administered 2016-02-14 – 2016-02-17 (×12): 2.5 mg via RESPIRATORY_TRACT
  Filled 2016-02-14 (×11): qty 3

## 2016-02-14 MED ORDER — ALBUTEROL SULFATE (2.5 MG/3ML) 0.083% IN NEBU: 2.5000 mg | INHALATION_SOLUTION | RESPIRATORY_TRACT | Status: DC | PRN

## 2016-02-14 MED ORDER — BUDESONIDE 0.25 MG/2ML IN SUSP
0.2500 mg | Freq: Four times a day (QID) | RESPIRATORY_TRACT | Status: DC
  Administered 2016-02-14 – 2016-02-19 (×19): 0.25 mg via RESPIRATORY_TRACT
  Filled 2016-02-14 (×18): qty 2

## 2016-02-14 MED ORDER — IPRATROPIUM BROMIDE 0.02 % IN SOLN
0.5000 mg | Freq: Four times a day (QID) | RESPIRATORY_TRACT | Status: DC
  Administered 2016-02-14 – 2016-02-17 (×12): 0.5 mg via RESPIRATORY_TRACT
  Filled 2016-02-14 (×11): qty 2.5

## 2016-02-14 MED ORDER — DEXTROSE 5 % IV SOLN
500.0000 mg | Freq: Every day | INTRAVENOUS | Status: DC
  Administered 2016-02-14 – 2016-02-15 (×2): 500 mg via INTRAVENOUS
  Filled 2016-02-14 (×3): qty 500

## 2016-02-14 NOTE — Progress Notes (Addendum)
Speech Language Pathology Treatment: Dysphagia  Patient Details Name: Rick Clark MRN: [id number] DOB: 1945/10/12 Today's Date: 02/14/2016 Time: 1201-1230 SLP Time Calculation (min) (ACUTE ONLY): 29 min  Assessment / Plan / Recommendation Clinical Impression  Pt seen as f/u today d/t change in respiratory status; pt is now on HFNC d/t need for increased O2 support secondary to bilateral pneumonia per MD note. Pt is on prednisone and feeling "hot". Encouraged pt to not talk and conserve his energy to lessen any increased WOB/SOB as he sipped at his tea. Pt consumed small, single sips of thin liquid w/ no apparent decline in respiratory status and no overt s/s of aspiration(coughing, throat clearing, significant change in breathing). Pt only wanted a few sips but appears to tolerate them w/out gross changes in status. Family stated pt was able to eat some dinner last evening w/ similar presentation and seemed to "enjoy the food".  Educated pt and family on his increased risk for aspiration secondary to declined Pulmonary status; pt/family agreed. Recommended following strict aspiration precautions including pacing self and small, single sips/bites; pt/family agreed. ST services will f/u to monitor pt's toleration for po's and any education while admitted. Family verbalized they understood pt's risk for aspiration at this time.     HPI HPI: Pt is 71 y.o. male with a known history of COPD, chronic respiratory failure, lung cancer, status post right upper thoracotomy, new. Hospitalist related left upper lobe lung mass, which is improving in size according to most recent CT scan about a week ago, history of recent admission to the hospital for pneumonia, was treated with Rocephin and Zithromax in the hospital and discharged on levofloxacin, comes back to the hospital with significant shortness of breath, not feeling well, very weak, not able to lift up phlegm, rattling in the chest. One to patient, he  improved in the hospital when he stated from the 27 to 02/05/2016 on antibiotic therapy, however, over the past 2 or 3 days. He's been having significant worsening of his condition, he is having a runny nose, dry mouth, pains in his shoulders, rattling in the chest, wheezing, shortness of breath, inefficient cough read. He was seen by Dr. Humphrey Rolls, his primary pulmonologist, who sent him as direct admit to the hospital. Currently, pt is on HFNC d/t decline in Pulmonary status overall; bilateral pneumonia and COPD exacerbation w/ Streptococcus pneumonia antigen positive, per MD note.      SLP Plan  Continue with current plan of care     Recommendations  Diet recommendations: Dysphagia 3 (mechanical soft);Thin liquid Liquids provided via: Cup;No straw Medication Administration: Crushed with puree (for easier swallowing) Supervision: Patient able to self feed (setup assist) Compensations: Minimize environmental distractions;Slow rate;Small sips/bites;Lingual sweep for clearance of pocketing;Multiple dry swallows after each bite/sip;Follow solids with liquid Postural Changes and/or Swallow Maneuvers: Seated upright 90 degrees;Upright 30-60 min after meal                General recommendations:  (Dietician f/u) Oral Care Recommendations: Oral care BID;Staff/trained caregiver to provide oral care Follow up Recommendations:  (TBD) Plan: Continue with current plan of care       GO                [name], [place], CCC-SLP Rick Clark 02/14/2016, 1:01 PM

## 2016-02-14 NOTE — Consult Note (Signed)
Name: Rick Clark MRN: [id number] DOB: Jun 16, 1945    ADMISSION DATE:  02/10/2016 CONSULTATION DATE:  02/14/16 REFERRING MD :  Dr. Darvin Neighbours   CHIEF COMPLAINT:  Shortness of breath  BRIEF PATIENT DESCRIPTION:  Rick Clark is a 71 y.o. Male who presented to [facility] ED on 1/29 with complaints of shortness of breath, cough, wheezing, rattling in his chest, malaise, and fatigue.  Pt was recently admitted to [facility] 1/23 for CAP, in which he received Rocephin and Zithromax. Pt was discharged home on Levofloxacin.  At home pt began to have progressive shortness of breath, cough, congestion, malaise, and fatigue.  Pt was seen by his Pulmonologist Dr. Humphrey Rolls for follow-up and was sent over for direct admit.  Pt is currently being treated for HAP and AECOPD.  Pt exhibits progressive shortness of breath, worsening PNA on CXR, and increasing FiO2 requirements, now 70% FiO2 on HFNC.  PCCM is consulted for further management of Acute Respiratory Failure secondary to HAP and AECOPD.  SIGNIFICANT EVENTS  02/10/16 Admission to [facility] Telemetry Unit 02/14/16 PCCM Consulted for Progressive Acute Respiratory Failure 02/14/16 Transfer to [facility] ICU/Step-Down Unit  STUDIES:  02/10/16 CXR>>Worsening of extensive pneumonia in the right mid and lower lung. New development of patchy infiltrate at the left base. Background emphysema. 02/14/16 CXR>>  Persistent and continued worsening of infiltrated density throughout the right lung.   HISTORY OF PRESENT ILLNESS:   Rick Clark is a 71 y.o. Caucasian male, with a PMH of Pneumonia, Lung Mass, Lung Cancer, COPD, Asthma, and Cardiovascular Stress Test.  He is status post right upper thoracotomy, and has left upper lobe lung mass which is improving in size as evidenced by most recent CT scan approximately 1 week ago.  He presented to [facility] ED on 1/29 with complaints of shortness of breath, cough, wheezing, rattling in his chest, malaise, and weakness.  Pt was recently admitted to [facility]  1/23 for CAP, in which he was treated with Rocephin and Zithromax.  He was discharged home on Levofloxacin.  Over the weekend 1/27 pt began to have increasing shortness of breath, cough, malaise, and fatigue.  He followed up with his Pulmonologist Dr. Humphrey Rolls on 1/29, in which he was sent over for direct admit to [facility] Telemetry unit.  Pt having progressive Shortness of breath, worsening PNA on CXR, and increasing FiO2 requirements, now requiring 70% FiO2 on HFNC to maintain 02 sats.  PCCM is consulted for further management of Acute Respiratory Failure secondary to HAP and AECOPD.  PAST MEDICAL HISTORY :   has a past medical history of Asthma; COPD (chronic obstructive pulmonary disease) ([place]); H/O cardiovascular stress test; Lung cancer ([place]) (1990); Lung mass; and Pneumonia.  has a past surgical history that includes Lung lobectomy (Right) and Flexible bronchoscopy (Bilateral, 09/18/2014). Prior to Admission medications   Medication Sig Start Date End Date Taking? Authorizing Provider  albuterol (PROVENTIL HFA;VENTOLIN HFA) 108 (90 BASE) MCG/ACT inhaler Inhale 1 puff into the lungs every 6 (six) hours as needed for wheezing or shortness of breath.   Yes Historical Provider, MD  budesonide-formoterol (SYMBICORT) 160-4.5 MCG/ACT inhaler Inhale 2 puffs into the lungs 2 (two) times daily.   Yes Historical Provider, MD  clotrimazole-betamethasone (LOTRISONE) cream Apply 1 application topically 2 (two) times daily.  11/12/15  Yes Historical Provider, MD  tiotropium (SPIRIVA) 18 MCG inhalation capsule Place 18 mcg into inhaler and inhale daily.   Yes Historical Provider, MD  triamcinolone cream (KENALOG) 0.1 % Apply 1 application topically 2 (two)  times daily.  11/12/15  Yes Historical Provider, MD  aspirin 81 MG chewable tablet Chew 81 mg by mouth every morning.    Historical Provider, MD  feeding supplement, ENSURE ENLIVE, (ENSURE ENLIVE) LIQD Take 237 mLs by mouth 2 (two) times daily between meals. 02/05/16    Bettey Costa, MD  levofloxacin (LEVAQUIN) 750 MG tablet Take 1 tablet (750 mg total) by mouth daily. Patient not taking: Reported on 02/12/2016 02/05/16   Bettey Costa, MD   No Known Allergies  FAMILY HISTORY:  family history includes Coronary artery disease in his father; Diabetes in his mother. SOCIAL HISTORY:  reports that he has been smoking Cigarettes.  He has a 27.50 pack-year smoking history. He has never used smokeless tobacco. He reports that he does not drink alcohol or use drugs.  REVIEW OF SYSTEMS:  Positives in BOLD Constitutional:  Fever, chills, weight loss, malaise/fatigue and diaphoresis.  HENT: Hearing loss, ear pain, nosebleeds, congestion, sore throat, neck pain, tinnitus and ear discharge.   Eyes: Blurred vision, double vision, photophobia, pain, discharge and redness.  Respiratory: Cough, hemoptysis, sputum production, shortness of breath, wheezing and stridor.   Cardiovascular: Chest pain,Chest tightness, palpitations, orthopnea, claudication, leg swelling and PND.  Gastrointestinal: Heartburn, nausea, vomiting, abdominal pain, diarrhea, constipation, blood in stool and melena.  Genitourinary: Dysuria, urgency, frequency, hematuria and flank pain.  Musculoskeletal: Myalgias, back pain, joint pain and falls.  Skin: Itching and Clark.  Neurological: Dizziness, tingling, tremors, sensory change, speech change, focal weakness, seizures, loss of consciousness, weakness and headaches.  Endo/Heme/Allergies: Environmental allergies and polydipsia. Does not bruise/bleed easily.  SUBJECTIVE:  Pt reports that his shortness of breath has improved with HFNC  VITAL SIGNS: Temp:  [97.7 F (36.5 C)-98.2 F (36.8 C)] 98.2 F (36.8 C) (02/02 1205) Pulse Rate:  [75-91] 91 (02/02 1205) Resp:  [12-18] 18 (02/02 1205) BP: (99-114)/(43-56) 99/56 (02/02 1205) SpO2:  [85 %-96 %] 94 % (02/02 1205) FiO2 (%):  [32 %-73 %] 73 % (02/02 1205)  PHYSICAL EXAMINATION: General:  Ill-appearing,  fragile, Caucasian male, lying in bed on HFNC, increased work of breathing Neuro:  Neurologically intact, alert and oriented, follows commands, PERRLA HEENT:  Atraumatic, normocephalic, No JVD Cardiovascular:  RRR, s1s2, no M/R/G noted Lungs:  Increased work of breathing, Rhonchi noted throughout, Tachypnea, symmetrical expansion Abdomen:  BS positive x4, soft, non-tender Musculoskeletal:  Emaciated. No edema noted. Skin:  Dry, warm, and intact.  No rashes, lesions, or ulcerations noted   Recent Labs Lab 02/10/16 1417 02/11/16 0208 02/14/16 0642  NA 135 135  --   K 3.6 3.6  --   CL 94* 97*  --   CO2 33* 33*  --   BUN 13 17  --   CREATININE 0.45* 0.44* <0.30*  GLUCOSE 104* 197*  --     Recent Labs Lab 02/10/16 1417 02/11/16 0208 02/14/16 0642  HGB 12.3* 11.0* 11.2*  HCT 38.3* 32.0* 33.4*  WBC 18.8* 13.1* 21.6*  PLT 573* 484* 577*   Dg Chest Port 1 View  Result Date: 02/14/2016 CLINICAL DATA:  Shortness of breath.  Lung cancer. EXAM: PORTABLE CHEST 1 VIEW COMPARISON:  02/10/2016 FINDINGS: Persistent worsening of diffuse density in the right lung consistent with worsened pneumonia. Mild patchy infiltrate at the left base as seen previously. IMPRESSION: Persistent and continued worsening of infiltrated density throughout the right lung. Electronically Signed   By: Nelson Chimes M.D.   On: 02/14/2016 10:11    ASSESSMENT / PLAN:  PULMONARY A:  Acute hypoxic respiratory failure secondary to HAP and AECOPD Hx: COPD, Lung Cancer and Lung mass  P: Maintain O2 sats >88% Supplemental O2 to maintain O2 sats Goal Bipap prn Add IV Azithromycin and Vancomycin Pharmacy consult for Abx Dosing Trend PCT's Screen for Influenza Continue Bronchodilators Continue ICS Consider switching PO Prednisone to IV Solu-Medrol if pt develops wheezing and increased respiratory distress  -Will transfer pt to ICU/Step-Down Unit due to high FiO2 requirements. -High risk for  intubation.   Pulmonary and [place] Pager: [phone]  02/14/2016, 1:53 PM    PCCM ATTENDING ATTESTATION:  I have evaluated patient with the APP [place], reviewed database in its entirety and discussed care plan in detail.   45 M smoker with history of squamous cell lung Ca, s/p XRT initially presented to ED 01/23 with acute on chronic respiratory and RUL infiltrate on CXR and CT chest. Appropriately diagnosed with PNA and discharged to home on PO abx. Presented again 01/29 with worsening symptoms and CXR and admitted for IV abx. PCCM asked to see for very high O2 requirements  Important exam findings: Severe cachecxia No overt distress but on HFNC 70% NCAT, Temporal wasting, poor dentition No JVD or LAN Markedly diminished BS throughout, distant rhonchi on R Reg, no M Abdomen scaphoid, soft, +BS Severe muscle wasting + digital clubbing, no edema  Major problems addressed by PCCM team: Cachexia Lung cancer Severe emphysema (by CT chest 01/23) Severe R lung consolidation consistent with PNA  PLAN/REC: Transfer to ICU Cont HFNC O2 Expand abx as above Discussed goals of care - in particular, the issue of intubation and mech vent. For now he remains full code but I cautioned that he would not recover well from such and we agreed that any intubation would be short term only - no trach or long term vent   Merton Border, MD PCCM service Mobile [phone] Pager [phone]

## 2016-02-14 NOTE — Care Management Important Message (Signed)
Important Message  Patient Details  Name: Rick Clark MRN: [id number] Date of Birth: [birth year]   Medicare Important Message Given:  Yes  Initial signed IM printed from Epic and given to patient.     Katrina Stack, RN 02/14/2016, 1:55 PM

## 2016-02-14 NOTE — Progress Notes (Signed)
  Pharmacy Antibiotic Note  Rick Clark is a 71 y.o. male with a h/o COPD and lung CA with recent hospital admission for PNA admitted on 02/10/2016 with pneumonia.  Pharmacy has been consulted for vancomycin dosing.Patient is also receiving cefepime and azithromycin.   Plan: Vancomycin 1000 mg iv once followed by 500 mg iv q 18 hours. Will adjust dosing and/or check levels as clinically indicated. Goal trough 15-20 mcg/ml.   Height: '5\' 5"'$  (165.1 cm) Weight: 80 lb 8 oz (36.5 kg) IBW/kg (Calculated) : 61.5  Temp (24hrs), Avg:97.9 F (36.6 C), Min:97.7 F (36.5 C), Max:98.2 F (36.8 C)   Recent Labs Lab 02/10/16 1417 02/11/16 0208 02/14/16 0642  WBC 18.8* 13.1* 21.6*  CREATININE 0.45* 0.44* <0.30*    CrCl cannot be calculated (This lab value cannot be used to calculate CrCl because it is not a number: <0.30).    No Known Allergies  Antimicrobials this admission: vancomycin 1/29 >> 1/30, 2/1 >> Cefepime  1/29 >>  Azithromycin 2/2 >>  Dose adjustments this admission:   Microbiology results: 1/29 BCx: NGTD 1/29 Sputum: normal flora    Thank you for allowing pharmacy to be a part of this patient's care.  Ulice Dash D 02/14/2016 1:26 PM

## 2016-02-14 NOTE — Progress Notes (Signed)
[place] [facility] [place] NAME: Rick Clark    MR#:  [id number]  DATE OF BIRTH:  1945-09-12  SUBJECTIVE:  CHIEF COMPLAINT:  No chief complaint on file.  Feels worse today. Worsening shortness of breath. Thick sputum. Afebrile. Patient desatted and had to be placed on high flow nasal cannula.  REVIEW OF SYSTEMS:    Review of Systems  Constitutional: Positive for malaise/fatigue. Negative for chills and fever.  HENT: Negative for sore throat.   Eyes: Negative for blurred vision, double vision and pain.  Respiratory: Positive for cough, sputum production, shortness of breath and wheezing. Negative for hemoptysis.   Cardiovascular: Negative for chest pain, palpitations, orthopnea and leg swelling.  Gastrointestinal: Negative for abdominal pain, constipation, diarrhea, heartburn, nausea and vomiting.  Genitourinary: Negative for dysuria and hematuria.  Musculoskeletal: Negative for back pain and joint pain.  Skin: Negative for rash.  Neurological: Positive for weakness. Negative for sensory change, speech change, focal weakness and headaches.  Endo/Heme/Allergies: Does not bruise/bleed easily.  Psychiatric/Behavioral: Negative for depression. The patient is not nervous/anxious.    DRUG ALLERGIES:  No Known Allergies  VITALS:  Blood pressure (!) 105/47, pulse 81, temperature 97.8 F (36.6 C), temperature source Oral, resp. rate 16, height '5\' 5"'$  (1.651 m), weight 36.5 kg (80 lb 8 oz), SpO2 (!) 88 %.  PHYSICAL EXAMINATION:   Physical Exam  GENERAL:  71 y.o.-year-old patient lying in the bed. Frail. Looks critically ill EYES: Pupils equal, round, reactive to light and accommodation. No scleral icterus. Extraocular muscles intact.  HEENT: Head atraumatic, normocephalic. Oropharynx and nasopharynx clear.  NECK:  Supple, no jugular venous distention. No thyroid enlargement, no tenderness.  LUNGS: increased work of breathing. Bilateral coarse  breath sounds with wheezing. CARDIOVASCULAR: S1, S2 normal. No murmurs, rubs, or gallops.  ABDOMEN: Soft, nontender, nondistended. Bowel sounds present. No organomegaly or mass.  EXTREMITIES: No cyanosis, clubbing or edema b/l.    NEUROLOGIC: Cranial nerves II through XII are intact. No focal Motor or sensory deficits b/l.   PSYCHIATRIC: The patient is alert and oriented x 3.  SKIN: No obvious rash, lesion, or ulcer.   LABORATORY PANEL:   CBC  Recent Labs Lab 02/14/16 0642  WBC 21.6*  HGB 11.2*  HCT 33.4*  PLT 577*   ------------------------------------------------------------------------------------------------------------------ Chemistries   Recent Labs Lab 02/10/16 1417 02/11/16 0208 02/14/16 0642  NA 135 135  --   K 3.6 3.6  --   CL 94* 97*  --   CO2 33* 33*  --   GLUCOSE 104* 197*  --   BUN 13 17  --   CREATININE 0.45* 0.44* <0.30*  CALCIUM 8.3* 8.0*  --   AST 60*  --   --   ALT 88*  --   --   ALKPHOS 116  --   --   BILITOT 0.6  --   --    ------------------------------------------------------------------------------------------------------------------  Cardiac Enzymes  Recent Labs Lab 02/11/16 0208  TROPONINI 0.07*   ------------------------------------------------------------------------------------------------------------------  RADIOLOGY:  Dg Chest Port 1 View  Result Date: 02/14/2016 CLINICAL DATA:  Shortness of breath.  Lung cancer. EXAM: PORTABLE CHEST 1 VIEW COMPARISON:  02/10/2016 FINDINGS: Persistent worsening of diffuse density in the right lung consistent with worsened pneumonia. Mild patchy infiltrate at the left base as seen previously. IMPRESSION: Persistent and continued worsening of infiltrated density throughout the right lung. Electronically Signed   By: Nelson Chimes M.D.   On: 02/14/2016 10:11   ASSESSMENT  AND PLAN:   # Acute on chronic respiratory failure with hypoxia due to bilateral pneumonia and COPD exacerbation Streptococcus  pneumonia antigen positive and urine.  Worsening today. Now on high flow nasal cannula. -IV steroids, IV cefepime - Scheduled Nebulizers - Inhalers -Wean O2 as tolerated - Consulted pulmonary.  Will discuss with pulmonary regarding bronchoscopy Family has requested [facility] medical group and through Dr. Nicholes Stairs for pulmonary care. We'll consult Dr. Alva Garnet. Discussed with pulmonary team. Critically ill. High risk for deterioration and intubation.  # Mild elevation in troponin due to demand ischemia. Not MI. Appreciate cardiology input.  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  # Hypotension Improved with IV fluids. stopped  # Generalized weakness  # Dysphagia Due to weakness. Speech therapy evaluation. On dysphagia diet. Aspiration precautions. High risk for aspiration.  All the records are reviewed and case discussed with Care Management/Social Worker Management plans discussed with the patient, family and they are in agreement.  CODE STATUS: FULL CODE  DVT Prophylaxis: SCDs  TOTAL CRITICAL CARE TIME TAKING CARE OF THIS PATIENT: 35 minutes.   POSSIBLE D/C IN 1-2 DAYS, DEPENDING ON CLINICAL CONDITION.  [name] R M.D on 02/14/2016 at 11:14 AM  Between 7am to 6pm - Pager - [phone]  After 6pm go to [email or web address] - password [email or web address] [place] [facility]  Office  [phone]  CC: Primary care physician; [name], MD  Note: This dictation was prepared with Dragon dictation along with smaller phrase technology. Any transcriptional errors that result from this process are unintentional.

## 2016-02-14 NOTE — Progress Notes (Signed)
Swabbed for the Flu.  Patient awaiting transfer to CCU.  Droplet precautions initiated.

## 2016-02-14 NOTE — Progress Notes (Signed)
Transferred to CCU bed 3

## 2016-02-14 NOTE — Progress Notes (Signed)
Nutrition Follow-up  DOCUMENTATION CODES:   Severe malnutrition in context of chronic illness, Underweight  INTERVENTION:  Continue Ensure Enlive po BID, each supplement provides 350 kcal and 20 grams of protein.  If patient requires intubation, recommend initiation of Adult Tube Feeding Protocol as soon as possible to provide enteral nutrition for patient in setting of severe malnutrition and poor PO intake.  Even if patient is not intubated, recommend discussion regarding placement of PEG tube if patient to continue receiving supportive care.  NUTRITION DIAGNOSIS:   Malnutrition related to chronic illness, other (see comment) (Lung Cancer) as evidenced by severe depletion of muscle mass, severe depletion of body fat, percent weight loss.  Ongoing.  GOAL:   Patient will meet greater than or equal to 90% of their needs  Not met.  MONITOR:   PO intake, Supplement acceptance, Labs, Weight trends, I & O's  REASON FOR ASSESSMENT:   Malnutrition Screening Tool    ASSESSMENT:   Rick Clark  is a 71 y.o. male with a known history of COPD, chronic respiratory failure, lung cancer, status post right upper thoracotomy, new. Hospitalist related left upper lobe lung mass, which is improving in size according to most recent CT scan about a week ago  CXR 2/2 found persistent and continued worsening of infiltrated density throughout the right lung. Patient's oxygen requirement has increased from 3L [place] to HFNC today. Patient has transferred to ICU/Step-Down Unit due to high FiO2 requirements.  Discussed with RN. Patient's appetite remains poor. Although chart reflects intake of 25-100% of meals, family has likely been finishing the patient's meals. Per RN patient only had a few bites of soup today and sips of Ensure.   Medications reviewed and include: azithromycin, Miralax, senna, vancomycin.  Labs reviewed: CBG 98-128.   Discussed with RN.   Diet Order:  DIET DYS 3 Room service  appropriate? Yes with Assist; Fluid consistency: Thin  Skin:  Reviewed, no issues  Last BM:  02/13/2016  Height:   Ht Readings from Last 1 Encounters:  02/10/16 _0  (1.651 m)    Weight:   Wt Readings from Last 1 Encounters:  02/10/16 80 lb 8 oz (36.5 kg)    Ideal Body Weight:  61.81 kg  BMI:  Body mass index is 13.4 kg/m.  Estimated Nutritional Needs:   Kcal:  1225-1400 (35-40 cal/kg)  Protein:  54-72 gm  Fluid:  >/= 1.2L  EDUCATION NEEDS:   No education needs identified at this time  Willey Blade, MS, RD, LDN Pager: [phone] After Hours Pager: [phone]

## 2016-02-14 NOTE — Care Management (Addendum)
patient requiring HFNC and transfer to ICU.  have suggested palliative care to address goals of care

## 2016-02-15 LAB — BASIC METABOLIC PANEL
Anion gap: 6 (ref 5–15)
BUN: 15 mg/dL (ref 6–20)
CHLORIDE: 92 mmol/L — AB (ref 101–111)
CO2: 38 mmol/L — ABNORMAL HIGH (ref 22–32)
Calcium: 8.3 mg/dL — ABNORMAL LOW (ref 8.9–10.3)
Creatinine, Ser: 0.41 mg/dL — ABNORMAL LOW (ref 0.61–1.24)
GFR calc Af Amer: >60 mL/min (ref >60)
GFR calc non Af Amer: >60 mL/min (ref >60)
GLUCOSE: 96 mg/dL (ref 65–99)
POTASSIUM: 3.6 mmol/L (ref 3.5–5.1)
Sodium: 136 mmol/L (ref 135–145)

## 2016-02-15 LAB — CULTURE, BLOOD (ROUTINE X 2)
CULTURE: NO GROWTH
CULTURE: NO GROWTH

## 2016-02-15 LAB — PROCALCITONIN: Procalcitonin: 0.12 ng/mL

## 2016-02-15 LAB — CBC
HCT: 38 % — ABNORMAL LOW (ref 40.0–52.0)
HEMOGLOBIN: 12 g/dL — AB (ref 13.0–18.0)
MCH: 24.7 pg — AB (ref 26.0–34.0)
MCHC: 31.6 g/dL — ABNORMAL LOW (ref 32.0–36.0)
MCV: 78.3 fL — AB (ref 80.0–100.0)
PLATELETS: 644 10*3/uL — AB (ref 150–440)
RBC: 4.85 MIL/uL (ref 4.40–5.90)
RDW: 14.8 % — ABNORMAL HIGH (ref 11.5–14.5)
WBC: 25.1 10*3/uL — ABNORMAL HIGH (ref 3.8–10.6)

## 2016-02-15 LAB — GLUCOSE, CAPILLARY: Glucose-Capillary: 91 mg/dL (ref 65–99)

## 2016-02-15 MED ORDER — MORPHINE SULFATE (PF) 4 MG/ML IV SOLN
2.0000 mg | INTRAVENOUS | Status: DC | PRN
  Administered 2016-02-15: 1 mg via INTRAVENOUS
  Administered 2016-02-15: 2 mg via INTRAVENOUS
  Filled 2016-02-15 (×2): qty 1

## 2016-02-15 MED ORDER — MORPHINE SULFATE (PF) 4 MG/ML IV SOLN
2.0000 mg | INTRAVENOUS | Status: DC | PRN
  Administered 2016-02-15 – 2016-02-18 (×2): 2 mg via INTRAVENOUS
  Filled 2016-02-15 (×2): qty 1

## 2016-02-15 NOTE — Progress Notes (Signed)
  Pharmacy Antibiotic Note  Rick Clark is a 71 y.o. male with a h/o COPD and lung CA with recent hospital admission for PNA admitted on 02/10/2016 with pneumonia.  Pharmacy has been consulted for vancomycin dosing.Patient is also receiving cefepime and azithromycin.     Patient transferred to ICU on 02/14/16 and antibiotics broadened.  Plan: Vancomycin 1000 mg iv once followed by 500 mg iv q 18 hours. Will adjust dosing and/or check levels as clinically indicated. Goal trough 15-20 mcg/ml.    Height: '5\' 5"'$  (165.1 cm) Weight: 80 lb 8 oz (36.5 kg) IBW/kg (Calculated) : 61.5  Temp (24hrs), Avg:97.6 F (36.4 C), Min:96.9 F (36.1 C), Max:98.1 F (36.7 C)   Recent Labs Lab 02/10/16 1417 02/11/16 0208 02/14/16 0642 02/15/16 0632  WBC 18.8* 13.1* 21.6* 25.1*  CREATININE 0.45* 0.44* <0.30* 0.41*    Estimated Creatinine Clearance: 43.1 mL/min (by C-G formula based on SCr of 0.41 mg/dL (L)).    No Known Allergies  Antimicrobials this admission: vancomycin 1/29 >> 1/30, 2/1 >> Cefepime  1/29 >>  Azithromycin 2/2 >>  Dose adjustments this admission:   Microbiology results: 1/29 BCx: NGTD 1/29 Sputum: normal flora    Thank you for allowing pharmacy to be a part of this patient's care.  Rick Clark A 02/15/2016 3:55 PM

## 2016-02-15 NOTE — Progress Notes (Signed)
Pt's oxygen saturations are dropping again. They are now around 83-85%. Pt states that he feels slightly SOB. He last had '2mg'$  of IV morphine at 0953. Per [facility] order he can receive the IV morphine every 3 hours. I spoke to Dr. Alva Garnet and he stated it would be okay to administer '1mg'$  of IV morphine now to help with pt's SOB.

## 2016-02-15 NOTE — Progress Notes (Signed)
[place] [facility] [place] NAME: Rick Clark    MR#:  [id number]  DATE OF BIRTH:  03-05-1945  SUBJECTIVE:  CHIEF COMPLAINT:  No chief complaint on file.  Seems worse today. Has SOB. No Pain Daughter at bedside  REVIEW OF SYSTEMS:    Review of Systems  Constitutional: Positive for malaise/fatigue. Negative for chills and fever.  HENT: Negative for sore throat.   Eyes: Negative for blurred vision, double vision and pain.  Respiratory: Positive for cough, sputum production, shortness of breath and wheezing. Negative for hemoptysis.   Cardiovascular: Negative for chest pain, palpitations, orthopnea and leg swelling.  Gastrointestinal: Negative for abdominal pain, constipation, diarrhea, heartburn, nausea and vomiting.  Genitourinary: Negative for dysuria and hematuria.  Musculoskeletal: Negative for back pain and joint pain.  Skin: Negative for rash.  Neurological: Positive for weakness. Negative for sensory change, speech change, focal weakness and headaches.  Endo/Heme/Allergies: Does not bruise/bleed easily.  Psychiatric/Behavioral: Negative for depression. The patient is not nervous/anxious.    DRUG ALLERGIES:  No Known Allergies  VITALS:  Blood pressure 100/73, pulse 78, temperature 98.1 F (36.7 C), temperature source Oral, resp. rate (!) 33, height '5\' 5"'$  (1.651 m), weight 36.5 kg (80 lb 8 oz), SpO2 (!) 84 %.  PHYSICAL EXAMINATION:   Physical Exam  GENERAL:  71 y.o.-year-old patient lying in the bed. Frail. Looks critically ill EYES: Pupils equal, round, reactive to light and accommodation. No scleral icterus. Extraocular muscles intact.  HEENT: Head atraumatic, normocephalic. Oropharynx and nasopharynx clear.  NECK:  Supple, no jugular venous distention. No thyroid enlargement, no tenderness.  LUNGS: increased work of breathing. Bilateral coarse breath sounds. Decreased air entry CARDIOVASCULAR: S1, S2 normal. No murmurs, rubs,  or gallops.  ABDOMEN: Soft, nontender, nondistended. Bowel sounds present. No organomegaly or mass.  EXTREMITIES: No cyanosis, clubbing or edema b/l.    NEUROLOGIC: Cranial nerves II through XII are intact. No focal Motor or sensory deficits b/l.   PSYCHIATRIC: The patient is alert and oriented x 3.  SKIN: No obvious rash, lesion, or ulcer.   LABORATORY PANEL:   CBC  Recent Labs Lab 02/15/16 0632  WBC 25.1*  HGB 12.0*  HCT 38.0*  PLT 644*   ------------------------------------------------------------------------------------------------------------------ Chemistries   Recent Labs Lab 02/10/16 1417  02/15/16 0632  NA 135  < > 136  K 3.6  < > 3.6  CL 94*  < > 92*  CO2 33*  < > 38*  GLUCOSE 104*  < > 96  BUN 13  < > 15  CREATININE 0.45*  < > 0.41*  CALCIUM 8.3*  < > 8.3*  AST 60*  --   --   ALT 88*  --   --   ALKPHOS 116  --   --   BILITOT 0.6  --   --   < > = values in this interval not displayed. ------------------------------------------------------------------------------------------------------------------  Cardiac Enzymes  Recent Labs Lab 02/11/16 0208  TROPONINI 0.07*   ------------------------------------------------------------------------------------------------------------------  RADIOLOGY:  Dg Chest Port 1 View  Result Date: 02/14/2016 CLINICAL DATA:  Shortness of breath.  Lung cancer. EXAM: PORTABLE CHEST 1 VIEW COMPARISON:  02/10/2016 FINDINGS: Persistent worsening of diffuse density in the right lung consistent with worsened pneumonia. Mild patchy infiltrate at the left base as seen previously. IMPRESSION: Persistent and continued worsening of infiltrated density throughout the right lung. Electronically Signed   By: Nelson Chimes M.D.   On: 02/14/2016 10:11   ASSESSMENT AND PLAN:   #  Acute on chronic respiratory failure with hypoxia due to bilateral pneumonia and COPD exacerbation Streptococcus pneumonia antigen positive and urine.  Further  worsening. Now on HFNC with NRB - On steroids and Abx - Scheduled Nebulizers - Consulted pulmonary.  Discussed with Dr. Leonidas Romberg. Will need intubation if any worsening but with poor prognosis he would be better served with DNR/DNI and comfort measures.  # Mild elevation in troponin due to demand ischemia. Not MI. Appreciate cardiology input.  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  # Generalized weakness  # Dysphagia Due to weakness.  On dysphagia diet. Aspiration precautions. High risk for aspiration.  All the records are reviewed and case discussed with Care Management/Social Worker Management plans discussed with the patient, family and they are in agreement.  CODE STATUS: FULL CODE  DVT Prophylaxis: SCDs  TOTAL CRITICAL CARE TIME TAKING CARE OF THIS PATIENT: 34 minutes.   [name] R M.D on 02/15/2016 at 8:39 AM  Between 7am to 6pm - Pager - [phone]  After 6pm go to [email or web address] - password [email or web address] [facility]  Office  [phone]  CC: Primary care physician; [name], MD  Note: This dictation was prepared with Dragon dictation along with smaller phrase technology. Any transcriptional errors that result from this process are unintentional.

## 2016-02-15 NOTE — Progress Notes (Addendum)
Pt's oxygen saturations are beginning to drop again and he states that he feels slightly SOB. When he receives morphine his oxygen sats seem to rise and he feels less SOB. Dr. Alva Garnet notified, new orders placed in epic by Dr. Alva Garnet.  Update 1437: Dr. Alva Garnet came and rounded on pt again. He is aware of pt's oxygen saturations. Pt is resting calmly in bed with family at bedside. He denies pain at this time. Pt remains alert and oriented x 4. No new orders received at this time.

## 2016-02-15 NOTE — Progress Notes (Addendum)
Central tele called around 0800 to inform me that pt's saturations were in the low-mid 80s. I went and assessed pt once hanging up with the tech from central tele, and pt was alert and oriented when I entered with the room with an oxygen saturation of 83%. Respiratory therapy (RT) called and he came to assess pt. Pt was on 74% Oxygen via high flow [place]. RT increased his oxygen percentage to 100% and placed a nonrebreather on pt. Pt asked to take deep breaths and cough. Pt was able to perform a deep breath and cough, but cough was very weak, and he did not produce any sputum with his cough. Oxygen saturations did not improve, pt remained alert and oriented x 4. MD paged. Pt also asked me to call his daughter. I called his daughter Katharine Look) and left a message asking her to call me back, she did not answer. While waiting for MD, RT performed NT suction. Pt tolerated suctioning well. Just as RT finished NT suction, both daughter and MD walked into the room. MD had a discussion about intubation, and his concerns that the pt may have difficulty coming off of the vent if he was intubated based on his chronic conditions. Daughter is agreeable to honoring whatever her father decides. Pt requested that his family be called (including a 82-year old grandson) to see him. I spoke to the nursing supervisor as we are on flu restrictions at the hospital. And she is agreeable to allowing the grandson to visit his grandfather as long as the grandson wears a mask at all times, uses handsanitizer, and does not stay for an extended period of time. Masks provided to pt and daughter in the room. Daughter has called family and asked them to come see the pt. Pt remains on 100% high flow [place] and nonrebreather. VS: BP 126/65   Pulse (!) 105   Temp 98.1 F (36.7 C) (Oral)   Resp (!) 32   Ht '5\' 5"'$  (1.651 m)   Wt 36.5 kg (80 lb 8 oz)   SpO2 (!) 84%   BMI 13.40 kg/m . Pt and daughter discussed pt's wishes about being placed on a ventilator.  Pt wrote/spoke to daughter using a pad of paper and told her that he did not want to be on "life support". MD notified.

## 2016-02-15 NOTE — Progress Notes (Signed)
   Name: Rick Clark MRN: [id number] DOB: [birth year]    ADMISSION DATE:  02/10/2016 CONSULTATION DATE:  02/14/16 REFERRING MD :  Dr. Darvin Neighbours   BRIEF PATIENT DESCRIPTION:  44 M with ES COPD/emphysema, squamous cell carcinoma of the lung, s/p XRT and admitted 01/29 with acute on chronic respiratory failure and extensive infiltrate in R lung. Urine strep antigen was positive. His O2 requirements progressively increased and PCCM consultation was performed 02/01. He was then on HFNC @ 70%. He was still full code. He was transferred to ICU and antibiotics were expanded.    SUBJECTIVE:  Episode of severe desaturation this AM. Discussion with pt and his family re: goals of care. DNR established. Presently with SpO2 in mid-80s on HFNC. He has had episodic dyspnea but improves with morphine.   VITAL SIGNS: Temp:  [96.9 F (36.1 C)-98.1 F (36.7 C)] 97.5 F (36.4 C) (02/03 1140) Pulse Rate:  [73-114] 110 (02/03 1400) Resp:  [17-39] 33 (02/03 1400) BP: (89-126)/(51-73) 102/52 (02/03 1400) SpO2:  [70 %-100 %] 83 % (02/03 1400) FiO2 (%):  [75 %-100 %] 100 % (02/03 1334)  PHYSICAL EXAMINATION: General: Cachectic, mildly labored breathing on HFNC, cognition intact HEENT:  Atraumatic, normocephalic, No JVD Cardiovascular: tachy, reg Lungs: No wheezes, extensive R sided rhonchi Abdomen: Soft Diffuse muscle wasting   Recent Labs Lab 02/10/16 1417 02/11/16 0208 02/14/16 0642 02/15/16 0632  NA 135 135  --  136  K 3.6 3.6  --  3.6  CL 94* 97*  --  92*  CO2 33* 33*  --  38*  BUN 13 17  --  15  CREATININE 0.45* 0.44* <0.30* 0.41*  GLUCOSE 104* 197*  --  96    Recent Labs Lab 02/11/16 0208 02/14/16 0642 02/15/16 0632  HGB 11.0* 11.2* 12.0*  HCT 32.0* 33.4* 38.0*  WBC 13.1* 21.6* 25.1*  PLT 484* 577* 644*   No new CXR  ASSESSMENT / PLAN:  PULMONARY A:  Acute on chronic hypoxic respiratory failure End stage emphysema CAP - 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

## 2016-02-15 NOTE — Progress Notes (Signed)
Pt's family from out of town is requesting that the pt's 73-year old granddaughter be able to see the pt. I called the nursing supervisor and per supervisor, the granddaughter may come to see pt for a brief visit. She is to wear a mask at all times, use hand sanitizer frequently, and is not to stay in the hospital for an extended period of time. Family aware and agreeable to ensuring the granddaughter wear a mask, use hand sanitizer, and not stay in the hospital for an extended time. Mask for the granddaughter provided to the family.

## 2016-02-16 MED ORDER — TEMAZEPAM 7.5 MG PO CAPS
7.5000 mg | ORAL_CAPSULE | Freq: Every evening | ORAL | Status: DC | PRN
  Administered 2016-02-16: 7.5 mg via ORAL
  Filled 2016-02-16: qty 1

## 2016-02-16 NOTE — Progress Notes (Signed)
   Name: Rick Clark MRN: [id number] DOB: 1945/05/16    ADMISSION DATE:  02/10/2016 CONSULTATION DATE:  02/14/16 REFERRING MD :  Dr. Darvin Neighbours   BRIEF PATIENT DESCRIPTION:  17 M with ES COPD/emphysema, squamous cell carcinoma of the lung, s/p XRT and admitted 01/29 with acute on chronic respiratory failure and extensive infiltrate in R lung. Urine strep antigen was positive. His O2 requirements progressively increased and PCCM consultation was performed 02/01. He was then on HFNC @ 70%. He was still full code. He was transferred to ICU and antibiotics were expanded.    SUBJECTIVE:  More comfortable this AM with improved O2 sats. Expressing desire to go home and receive rest of therapy there as a Hospice patient  VITAL SIGNS: Temp:  [97.6 F (36.4 C)-100.1 F (37.8 C)] 97.6 F (36.4 C) (02/04 1400) Pulse Rate:  [75-119] 100 (02/04 1800) Resp:  [16-34] 25 (02/04 1800) BP: (91-126)/(44-72) 106/53 (02/04 1800) SpO2:  [79 %-99 %] 99 % (02/04 1843) FiO2 (%):  [100 %] 100 % (02/04 0728)  PHYSICAL EXAMINATION: General: Cachectic, NAD, remains on HFNC, cognition intact HEENT:  Atraumatic, normocephalic, No JVD Cardiovascular: tachy, reg Lungs: No wheezes, extensive R sided rhonchi Abdomen: Soft, +BS Diffuse muscle wasting   Recent Labs Lab 02/10/16 1417 02/11/16 0208 02/14/16 0642 02/15/16 0632  NA 135 135  --  136  K 3.6 3.6  --  3.6  CL 94* 97*  --  92*  CO2 33* 33*  --  38*  BUN 13 17  --  15  CREATININE 0.45* 0.44* <0.30* 0.41*  GLUCOSE 104* 197*  --  96    Recent Labs Lab 02/11/16 0208 02/14/16 0642 02/15/16 0632  HGB 11.0* 11.2* 12.0*  HCT 32.0* 33.4* 38.0*  WBC 13.1* 21.6* 25.1*  PLT 484* 577* 644*   No new CXR  ASSESSMENT / PLAN:  PULMONARY A:  Acute on chronic hypoxic respiratory failure End stage emphysema CAP - likely pneumococcus Cachexia  P: Continue current abx, HFNC, nebs DNR remains appropriate Cont PRN morphine for any form of  discomfort I have asked for Palliative Care consult to see if we can arrange for discharge with Hospice   Merton Border, MD PCCM service Mobile [phone] Pager [phone]

## 2016-02-16 NOTE — Progress Notes (Signed)
[place] [facility] [place] [employer] [employer] NAME: Rick Clark    MR#:  [id number]  DATE OF BIRTH:  [birth year]  SUBJECTIVE:  CHIEF COMPLAINT:  No chief complaint on file.  100% high flow nasal cannula Family at bedside. Seems more comfortable with morphine as needed  Patient is happy that he was able to meet his grandchildren yesterday.  REVIEW OF SYSTEMS:    Review of Systems  Constitutional: Positive for malaise/fatigue. Negative for chills and fever.  HENT: Negative for sore throat.   Eyes: Negative for blurred vision, double vision and pain.  Respiratory: Positive for cough, sputum production, shortness of breath and wheezing. Negative for hemoptysis.   Cardiovascular: Negative for chest pain, palpitations, orthopnea and leg swelling.  Gastrointestinal: Negative for abdominal pain, constipation, diarrhea, heartburn, nausea and vomiting.  Genitourinary: Negative for dysuria and hematuria.  Musculoskeletal: Negative for back pain and joint pain.  Skin: Negative for rash.  Neurological: Positive for weakness. Negative for sensory change, speech change, focal weakness and headaches.  Endo/Heme/Allergies: Does not bruise/bleed easily.  Psychiatric/Behavioral: Negative for depression. The patient is not nervous/anxious.    DRUG ALLERGIES:  No Known Allergies  VITALS:  Blood pressure (!) 106/59, pulse (!) 103, temperature 99 F (37.2 C), temperature source Oral, resp. rate (!) 28, height '5\' 5"'$  (1.651 m), weight 36.5 kg (80 lb 8 oz), SpO2 94 %.  PHYSICAL EXAMINATION:   Physical Exam  GENERAL:  71 y.o.-year-old patient lying in the bed. Frail. Looks critically ill EYES: Pupils equal, round, reactive to light and accommodation. No scleral icterus. Extraocular muscles intact.  HEENT: Head atraumatic, normocephalic. Oropharynx and nasopharynx clear.  NECK:  Supple, no jugular venous distention. No thyroid enlargement, no tenderness.  LUNGS: increased work  of breathing. Bilateral coarse breath sounds. Decreased air entry CARDIOVASCULAR: S1, S2 normal. No murmurs, rubs, or gallops.  ABDOMEN: Soft, nontender, nondistended. Bowel sounds present. No organomegaly or mass.  EXTREMITIES: No cyanosis, clubbing or edema b/l.    NEUROLOGIC: Cranial nerves II through XII are intact. No focal Motor or sensory deficits b/l.   PSYCHIATRIC: The patient is alert and oriented x 3.  SKIN: No obvious rash, lesion, or ulcer.   LABORATORY PANEL:   CBC  Recent Labs Lab 02/15/16 0632  WBC 25.1*  HGB 12.0*  HCT 38.0*  PLT 644*   ------------------------------------------------------------------------------------------------------------------ Chemistries   Recent Labs Lab 02/10/16 1417  02/15/16 0632  NA 135  < > 136  K 3.6  < > 3.6  CL 94*  < > 92*  CO2 33*  < > 38*  GLUCOSE 104*  < > 96  BUN 13  < > 15  CREATININE 0.45*  < > 0.41*  CALCIUM 8.3*  < > 8.3*  AST 60*  --   --   ALT 88*  --   --   ALKPHOS 116  --   --   BILITOT 0.6  --   --   < > = values in this interval not displayed. ------------------------------------------------------------------------------------------------------------------  Cardiac Enzymes  Recent Labs Lab 02/11/16 0208  TROPONINI 0.07*   ------------------------------------------------------------------------------------------------------------------  RADIOLOGY:  No results found. ASSESSMENT AND PLAN:   # Acute on chronic respiratory failure with hypoxia due to bilateral pneumonia and COPD exacerbation Streptococcus pneumonia antigen positive and urine.  Further worsening. Now on HFNC 100% O2 - On steroids and Abx - Scheduled Nebulizers Discussed with Dr. Leonidas Romberg.  Now patient DNR  Discussed with patient with daughter and wife at  bedside. He feels better than yesterday. He understands that he is critically ill on maximal oxygen. I have explained to him that clinically he seems to be worse over the past  few days. He is appreciative of the care in the hospital. Wants to go home if there is any worsening. Discussed with him regarding setting up hospice at home. Agreeable We'll consult palliative care.  # Mild elevation in troponin due to demand ischemia. Not MI. Appreciate cardiology input.  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  # Generalized weakness  # Dysphagia Due to weakness.  On dysphagia diet.  All the records are reviewed and case discussed with Care Management/Social Worker Management plans discussed with the patient, family and they are in agreement.  CODE STATUS: FULL CODE  DVT Prophylaxis: SCDs  TOTAL CRITICAL CARE TIME TAKING CARE OF THIS PATIENT: 35 minutes.   [name] R M.D on 02/16/2016 at 11:00 AM  Between 7am to 6pm - Pager - [phone]  After 6pm go to [email or web address] - password [email or web address] [facility]  Office  [phone]  CC: Primary care physician; [name], MD  Note: This dictation was prepared with Dragon dictation along with smaller phrase technology. Any transcriptional errors that result from this process are unintentional.

## 2016-02-16 NOTE — Progress Notes (Signed)
Pt has been awake most of the day. Has had constant visitors from 8 AM to 6:30 PM. Continue on HFNC. Desats occasionally. Family spoke with Dr. Alva Garnet and order entered for palliative care.

## 2016-02-16 NOTE — Progress Notes (Signed)
  Pharmacy Antibiotic Note  Rick Clark is a 71 y.o. male with a h/o COPD and lung CA with recent hospital admission for PNA admitted on 02/10/2016 with pneumonia.  Pharmacy has been consulted for vancomycin and Cefepime dosing.Patient is also receiving azithromycin.     Patient transferred to ICU on 02/14/16 and antibiotics broadened.  Vanc/Azith discontinued 02/16/16.  Plan: Continue Cefepime 2g IV q 12hr   Height: '5\' 5"'$  (165.1 cm) Weight: 80 lb 8 oz (36.5 kg) IBW/kg (Calculated) : 61.5  Temp (24hrs), Avg:98.7 F (37.1 C), Min:97.6 F (36.4 C), Max:100.1 F (37.8 C)   Recent Labs Lab 02/10/16 1417 02/11/16 0208 02/14/16 0642 02/15/16 0632  WBC 18.8* 13.1* 21.6* 25.1*  CREATININE 0.45* 0.44* <0.30* 0.41*    Estimated Creatinine Clearance: 43.1 mL/min (by C-G formula based on SCr of 0.41 mg/dL (L)).    No Known Allergies  Antimicrobials this admission: vancomycin 1/29 >> 1/30, 2/1 >>2/4 Cefepime  1/29 >>  Azithromycin 2/2 >> 2/4  Dose adjustments this admission:   Microbiology results: 1/29 BCx: NGTD 1/29 Sputum: normal flora    Thank you for allowing pharmacy to be a part of this patient's care.  Felipe Cabell A 02/16/2016 3:25 PM

## 2016-02-17 ENCOUNTER — Inpatient Hospital Stay: Payer: PPO

## 2016-02-17 DIAGNOSIS — Z515 Encounter for palliative care: Secondary | ICD-10-CM

## 2016-02-17 DIAGNOSIS — Z7189 Other specified counseling: Secondary | ICD-10-CM

## 2016-02-17 LAB — CREATININE, SERUM
CREATININE: 0.37 mg/dL — AB (ref 0.61–1.24)
GFR calc Af Amer: >60 mL/min (ref >60)

## 2016-02-17 LAB — GLUCOSE, CAPILLARY: GLUCOSE-CAPILLARY: 194 mg/dL — AB (ref 65–99)

## 2016-02-17 MED ORDER — IPRATROPIUM-ALBUTEROL 0.5-2.5 (3) MG/3ML IN SOLN
3.0000 mL | Freq: Four times a day (QID) | RESPIRATORY_TRACT | Status: DC
  Administered 2016-02-17 – 2016-02-19 (×7): 3 mL via RESPIRATORY_TRACT
  Filled 2016-02-17 (×8): qty 3

## 2016-02-17 MED ORDER — DEXTROSE 5 % IV SOLN
2.0000 g | INTRAVENOUS | Status: DC
  Administered 2016-02-17 – 2016-02-18 (×2): 2 g via INTRAVENOUS
  Filled 2016-02-17 (×6): qty 2

## 2016-02-17 MED ORDER — MORPHINE SULFATE (CONCENTRATE) 10 MG/0.5ML PO SOLN
5.0000 mg | ORAL | Status: DC | PRN
  Administered 2016-02-17: 5 mg via SUBLINGUAL
  Filled 2016-02-17: qty 1

## 2016-02-17 MED ORDER — OXYCODONE HCL 5 MG PO TABS: 2.5000 mg | ORAL_TABLET | ORAL | Status: DC | PRN

## 2016-02-17 MED ORDER — POLYETHYLENE GLYCOL 3350 17 G PO PACK
17.0000 g | PACK | Freq: Every day | ORAL | Status: DC
  Administered 2016-02-17 – 2016-02-19 (×3): 17 g via ORAL
  Filled 2016-02-17 (×3): qty 1

## 2016-02-17 MED ORDER — METHYLPREDNISOLONE SODIUM SUCC 125 MG IJ SOLR
60.0000 mg | INTRAMUSCULAR | Status: DC
  Administered 2016-02-17 – 2016-02-18 (×2): 60 mg via INTRAVENOUS
  Filled 2016-02-17 (×2): qty 2

## 2016-02-17 NOTE — Progress Notes (Signed)
  Pharmacy Antibiotic Note  Rick Clark is a 71 y.o. male with a h/o COPD and lung CA with recent hospital admission for PNA admitted on 02/10/2016 with pneumonia. Patient transferred to ICU on 02/14/16 and antibiotics broadened. Pharmacy consulted for cefepime dosing.   Plan: Continue cefepime 2g IV Q24hr.   Height: '5\' 5"'$  (165.1 cm) Weight: 80 lb 8 oz (36.5 kg) IBW/kg (Calculated) : 61.5  Temp (24hrs), Avg:97.9 F (36.6 C), Min:97.6 F (36.4 C), Max:98.2 F (36.8 C)   Recent Labs Lab 02/10/16 1417 02/11/16 0208 02/14/16 0642 02/15/16 0632 02/17/16 0441  WBC 18.8* 13.1* 21.6* 25.1*  --   CREATININE 0.45* 0.44* <0.30* 0.41* 0.37*    Estimated Creatinine Clearance: 43.1 mL/min (by C-G formula based on SCr of 0.37 mg/dL (L)).    No Known Allergies  Antimicrobials this admission: vancomycin 1/29 >> 1/30, 2/1 >>2/4 Cefepime  1/29 >>  Azithromycin 2/2 >> 2/4  Dose adjustments this admission: 2/5 Cefepime interval changed to Q24hr.   Microbiology results: 1/29 BCx: no growth  1/29 Sputum: normal flora  2/2 MRSA PCR: negative    Pharmacy will continue to monitor and adjust per consult.    Deontae Robson L 02/17/2016 12:22 PM

## 2016-02-17 NOTE — Progress Notes (Signed)
   Name: Rick Clark MRN: [id number] DOB: 1945-12-09    ADMISSION DATE:  02/10/2016 CONSULTATION DATE:  02/14/16 REFERRING MD :  Dr. Darvin Neighbours   BRIEF PATIENT DESCRIPTION:  43 M with ES COPD/emphysema, squamous cell carcinoma of the lung, s/p XRT and admitted 01/29 with acute on chronic respiratory failure and extensive infiltrate in R lung. Urine strep antigen was positive. His O2 requirements progressively increased and PCCM consultation was performed 02/01. He was then on HFNC @ 70%. He was still full code. He was transferred to ICU and antibiotics were expanded.    ASSESSMENT / PLAN:  PULMONARY A:  Acute on chronic hypoxic respiratory failure, continues on 70% hi flow.  End stage emphysema CAP - likely pneumococcus Cachexia  P: Continue current abx, HFNC, nebs DNR remains appropriate Cont PRN morphine for any form of discomfort Palliative Care consulted, will need to wean down oxygen further see if the patient is hospice appropriate. -Wean down high flow oxygen to sats greater than 85%. Consideration is being given to hospice, we could change the patient to a simple mask with an O2 rate of 12 L/m.  ---------------------------------------  SUBJECTIVE:  More comfortable this AM with improved O2 sats. Expressing desire to go home and receive rest of therapy there as a Hospice patient  VITAL SIGNS: Temp:  [97.6 F (36.4 C)-98.2 F (36.8 C)] 98.1 F (36.7 C) (02/05 0846) Pulse Rate:  [77-119] 90 (02/05 1200) Resp:  [14-32] 31 (02/05 1200) BP: (89-126)/(44-69) 116/54 (02/05 1200) SpO2:  [79 %-100 %] 93 % (02/05 1200) FiO2 (%):  [70 %-100 %] 70 % (02/05 0731)  PHYSICAL EXAMINATION: General: Cachectic, NAD, remains on HFNC, cognition intact HEENT:  Atraumatic, normocephalic, No JVD Cardiovascular: tachy, reg Lungs: No wheezes, extensive R sided rhonchi Abdomen: Soft, +BS Diffuse muscle wasting   Recent Labs Lab 02/10/16 1417 02/11/16 0208 02/14/16 0642  02/15/16 0632 02/17/16 0441  NA 135 135  --  136  --   K 3.6 3.6  --  3.6  --   CL 94* 97*  --  92*  --   CO2 33* 33*  --  38*  --   BUN 13 17  --  15  --   CREATININE 0.45* 0.44* <0.30* 0.41* 0.37*  GLUCOSE 104* 197*  --  96  --     Recent Labs Lab 02/11/16 0208 02/14/16 0642 02/15/16 0632  HGB 11.0* 11.2* 12.0*  HCT 32.0* 33.4* 38.0*  WBC 13.1* 21.6* 25.1*  PLT 484* 577* 644*   No new CXR     Deep Ashby Dawes, MD PCCM service Pager [phone]

## 2016-02-17 NOTE — Progress Notes (Addendum)
[place] [facility] [place] NAME: Rick Clark    MR#:  [id number]  DATE OF BIRTH:  October 09, 1945  SUBJECTIVE:   Patient wants to go home Still on high flow oxygen  REVIEW OF SYSTEMS:    Review of Systems  Constitutional: Negative.  Negative for chills, fever and malaise/fatigue.  HENT: Negative.  Negative for ear discharge, ear pain, hearing loss, nosebleeds and sore throat.   Eyes: Negative.  Negative for blurred vision and pain.  Respiratory: Positive for shortness of breath. Negative for cough, hemoptysis and wheezing.   Cardiovascular: Negative.  Negative for chest pain, palpitations and leg swelling.  Gastrointestinal: Negative.  Negative for abdominal pain, blood in stool, diarrhea, nausea and vomiting.  Genitourinary: Negative.  Negative for dysuria.  Musculoskeletal: Negative.  Negative for back pain.  Skin: Negative.   Neurological: Negative for dizziness, tremors, speech change, focal weakness, seizures and headaches.  Endo/Heme/Allergies: Negative.  Does not bruise/bleed easily.  Psychiatric/Behavioral: Negative.  Negative for depression, hallucinations and suicidal ideas.    Tolerating Diet: yes      DRUG ALLERGIES:  No Known Allergies  VITALS:  Blood pressure (!) 109/55, pulse (!) 102, temperature 98.1 F (36.7 C), temperature source Oral, resp. rate 20, height '5\' 5"'$  (1.651 m), weight 36.5 kg (80 lb 8 oz), SpO2 93 %.  PHYSICAL EXAMINATION:   Physical Exam  Constitutional: He is oriented to person, place, and time. No distress.  Frail ill appearing weak  HENT:  Head: Normocephalic.  Eyes: No scleral icterus.  Neck: Normal range of motion. Neck supple. No JVD present. No tracheal deviation present.  Cardiovascular: Normal rate, regular rhythm and normal heart sounds.  Exam reveals no gallop and no friction rub.   No murmur heard. Pulmonary/Chest: Effort normal. No respiratory distress. He has no wheezes. He has no  rales. He exhibits no tenderness.  Decreased throughout Rhonchorous sounds right lung  Abdominal: Soft. Bowel sounds are normal. He exhibits no distension and no mass. There is no tenderness. There is no rebound and no guarding.  Musculoskeletal: Normal range of motion. He exhibits no edema.  Neurological: He is alert and oriented to person, place, and time.  Skin: Skin is warm. No rash noted. No erythema.  Psychiatric: Affect normal.      LABORATORY PANEL:   CBC  Recent Labs Lab 02/15/16 0632  WBC 25.1*  HGB 12.0*  HCT 38.0*  PLT 644*   ------------------------------------------------------------------------------------------------------------------  Chemistries   Recent Labs Lab 02/10/16 1417  02/15/16 0632 02/17/16 0441  NA 135  < > 136  --   K 3.6  < > 3.6  --   CL 94*  < > 92*  --   CO2 33*  < > 38*  --   GLUCOSE 104*  < > 96  --   BUN 13  < > 15  --   CREATININE 0.45*  < > 0.41* 0.37*  CALCIUM 8.3*  < > 8.3*  --   AST 60*  --   --   --   ALT 88*  --   --   --   ALKPHOS 116  --   --   --   BILITOT 0.6  --   --   --   < > = values in this interval not displayed. ------------------------------------------------------------------------------------------------------------------  Cardiac Enzymes  Recent Labs Lab 02/10/16 1417 02/10/16 2004 02/11/16 0208  TROPONINI 0.12* 0.04* 0.07*   ------------------------------------------------------------------------------------------------------------------  RADIOLOGY:  No results  found.   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 Acute on chronic hypoxic respiratory failure in the setting of end-stage COPD and community-acquired pneumonia Weaning high flow nasal cannula as tolerated Palliative care consultation  for disposition to home with hospice  2. Acute COPD exacerbation Continue Solu-Medrol and duo nebs  3. CAP: Continue cefepime  4. Elevated troponin due to demand ischemia and not ACS  5. Dysphagia due to weakness on dysphagia diet  Discussed plan of care as outlined above with palliative care consultant. Attempt to wean high flow nasal cannula. We will try to get patient home once oxygen requirement has decreased.  Management plans discussed with the patient and daughter and they are in agreement.  CODE STATUS: DNR  Critical care TOTAL TIME TAKING CARE OF THIS PATIENT: 30 minutes.     POSSIBLE D/C ??, DEPENDING ON CLINICAL CONDITION.   Catarino Vold M.D on 02/17/2016 at 10:19 AM  Between 7am to 6pm - Pager - [phone] After 6pm go to [email or web address] - password [email or web address] [facility]  Office  [phone]  CC: Primary care physician; [name], MD  Note: This dictation was prepared with Dragon dictation along with smaller phrase technology. Any transcriptional errors that result from this process are unintentional.

## 2016-02-17 NOTE — Progress Notes (Signed)
D/w RT.  Pt transitioned to plain mask with 12L flow and wean down to sat > 85%. He appears to be tolerating this well, and should be able to go home with hospice with this setting.   Will sign off for now, please call if there are any questions or concerns.   Marda Stalker, M.D.  02/17/2016

## 2016-02-17 NOTE — Consult Note (Signed)
Consultation Note Date: 02/17/2016   Patient Name: Rick Clark  DOB: Jul 24, 1945  MRN: [id number]  Age / Sex: 71 y.o., male  PCP: Rick Guise, MD Referring Physician: Bettey Costa, MD  Reason for Consultation: Establishing goals of care, Hospice Evaluation, Non pain symptom management and Withdrawal of life-sustaining treatment  HPI/Patient Profile: 71 y.o. male  with past medical history of lung cancer and COPD who was admitted on 02/10/2016 with acute on chronic respiratory failure.  CXR showed extensive RLL pneumonia.  Rick Clark has been treated aggressively with steroids and antibiotics however he remains in the ICU on 35L of high flow nasal canula.    Clinical Assessment and Goals of Care   I have reviewed medical records including imaging, labs, medications and epic notes, received report from the attending physician and bedside RN, assessed the patient and then met at the bedside along with his daughter and nephew to discuss diagnosis prognosis, [place], EOL wishes, disposition and options.  I introduced Palliative Medicine as specialized medical care for people living with serious illness. It focuses on providing relief from the symptoms and stress of a serious illness. The goal is to improve quality of life for both the patient and the family.  We discussed a brief life review of the patient.  Natural disease trajectory and expectations at EOL were discussed. Rick Clark and his family understand he is at end of life.  Rick Clark would very much like to die at home with his family, but at a minimum he prefers not to die in the hospital.  He asks to go to hospice house.  We discuss the needs to get his oxygen demand down in order to be able to leave the ICU.  If we are unable to decrease the amount of oxygen he requires and therefore are unable to discharge him from the hospital, Rick Clark  requests comfort medications at end of life so that he does not suffer.  Rick Clark also complained of constipation and asked that his miralax be restarted.  I discussed the case with Rick Clark of [facility] and determined that the maxium oxygen they can supply is 15L at 35% fiO2.  We will attempt to wean him down to this level and discharge him to hospice house.  Questions and concerns were addressed.  The family was encouraged to call with questions or concerns.   PMT will continue to support holistically.  Primary Decision Maker:  PATIENT    SUMMARY OF RECOMMENDATIONS   Attempt to wean oxygen to 15L at 35% and discharge to [facility].  Code Status/Advance Care Planning:  DNR    Symptom Management:   roxanol liquid for dyspnea or pain  - use frequently when trying to wean down oxygen.  miralax for constipation - patient requests it in a 1/2 cup of hot tea with lots of sugar.   Psycho-social/Spiritual:   Desire for further Chaplaincy support: Yes.  Order placed.  Prognosis:   Hours - Days  Discharge Planning:  Hospice facility      Primary Diagnoses: Present on Admission: . Pneumonia   I have reviewed the medical record, interviewed the patient and family, and examined the patient. The following aspects are pertinent.  Past Medical History:  Diagnosis Date  . Asthma   . COPD (chronic obstructive pulmonary disease) ([place])   . H/O cardiovascular stress test    a. 02/2015 Myoview: no ischemia/infarct, EF 55-65%.  . Lung cancer [facility]) 1990   Patient states RUL Thoracotomy.  . Lung mass    a. spiculated LUL lung mass;  b. 01/2016 Chest CT: LUL nodule 0.9 x 0.9 cm - improved from 1.2 x 1.1 cm.  . Pneumonia    a. 11/2013 with admission 01/2016 as well.   Social History   Social History  . Marital status: Single    Spouse name: N/A  . Number of children: N/A  . Years of education: N/A   Social History Main Topics  . Smoking status:  Current Every Day Smoker    Packs/day: 0.50    Years: 55.00    Types: Cigarettes  . Smokeless tobacco: Never Used  . Alcohol use No  . Drug use: No  . Sexual activity: Yes   Other Topics Concern  . None   Social History Narrative   Lives at home with granddaughter.   Very weak   Family History  Problem Relation Age of Onset  . Coronary artery disease Father   . Diabetes Mother    Scheduled Meds: . budesonide (PULMICORT) nebulizer solution  0.25 mg Nebulization Q6H  . ceFEPime (MAXIPIME) IV  2 g Intravenous Q24H  . enoxaparin (LOVENOX) injection  30 mg Subcutaneous Q24H  . feeding supplement (ENSURE ENLIVE)  237 mL Oral BID BM  . ipratropium-albuterol  3 mL Nebulization Q6H  . mouth rinse  15 mL Mouth Rinse BID  . methylPREDNISolone (SOLU-MEDROL) injection  60 mg Intravenous Q24H  . nicotine  21 mg Transdermal Daily  . polyethylene glycol  17 g Oral Daily  . sodium chloride flush  3 mL Intravenous Q12H   Continuous Infusions: PRN Meds:.acetaminophen **OR** acetaminophen, albuterol, morphine injection, morphine CONCENTRATE, [DISCONTINUED] ondansetron **OR** ondansetron (ZOFRAN) IV, temazepam No Known Allergies Review of Systems patient too short of breath to provide.  Physical Exam  Cachectically thin elderly man.  A&O unable to speak.  Writes short notes CV irreg Resp decreased breath sounds.  On high flow nasal canula. Abdomen firm, ND Ext.  No edema.  Vital Signs: BP (!) 109/55   Pulse (!) 102   Temp 98.1 F (36.7 C) (Oral)   Resp 20   Ht '5\' 5"'  (1.651 m)   Wt 36.5 kg (80 lb 8 oz)   SpO2 93%   BMI 13.40 kg/m  Pain Assessment: No/denies pain POSS *See Group Information*: 1-Acceptable,Awake and alert Pain Score: 0-No pain   SpO2: SpO2: 93 % O2 Device:SpO2: 93 % O2 Flow Rate: .O2 Flow Rate (L/min): 45 L/min  IO: Intake/output summary:  Intake/Output Summary (Last 24 hours) at 02/17/16 1145 Last data filed at 02/17/16 0847  Gross per 24 hour  Intake                50 ml  Output              555 ml  Net             -505 ml    LBM: Last BM Date: 02/13/16 Baseline Weight: Weight: 36.5 kg (80 lb 9  oz) Most recent weight: Weight: 36.5 kg (80 lb 8 oz)     Palliative Assessment/Data:   Flowsheet Rows   Flowsheet Row Most Recent Value  Intake Tab  Referral Department  Critical care  Unit at Time of Referral  ICU  Palliative Care Primary Diagnosis  Pulmonary  Date Notified  02/16/16  Palliative Care Type  New Palliative care  Reason for referral  Non-pain Symptom, Counsel Regarding Hospice, End of Life Care Assistance  Date of Admission  02/10/16  Date first seen by Palliative Care  02/17/16  # of days Palliative referral response time  1 Day(s)  # of days IP prior to Palliative referral  6  Clinical Assessment  Palliative Performance Scale Score  20%  Psychosocial & Spiritual Assessment  Palliative Care Outcomes  Patient/Family meeting held?  Yes  Who was at the meeting?  patient, dtr, nehew  Palliative Care Outcomes  Improved non-pain symptom therapy, Clarified goals of care, Counseled regarding hospice, Provided end of life care assistance       Time Total: 70 min. Greater than 50%  of this time was spent counseling and coordinating care related to the above assessment and plan.  Signed by: Imogene Burn, PA-C Palliative Medicine Pager: [phone]  Please contact Palliative Medicine Team phone at [phone] for questions and concerns.  For individual provider: See Shea Evans

## 2016-02-17 NOTE — Progress Notes (Signed)
[place] responded to an OR for Palliative Care in room IC 03. Pt was awake and alert. Wife was bedside. Wife and Pt needed prayer to support a recent decision to transfer to Palliative Care. Pt stated that he has a HCPOA and that he also has his spiritual life in order. Eugene provided prayer and spiritual support. CH is available for follow up as needed.    02/17/16 1400  Clinical Encounter Type  Visited With Patient;Patient and family together  Visit Type Initial;Spiritual support;Critical Care  Referral From Nurse  Consult/Referral To Chaplain  Spiritual Encounters  Spiritual Needs Prayer;Emotional  Stress Factors  Patient Stress Factors Exhausted;Health changes

## 2016-02-17 NOTE — Care Management (Signed)
Hospice Home discharge is under consideration.  Patient wants to discharge home. If patient is able to discharge home with hospice services, agency preference is [facility].  He would need wheelchair, BSC, hospital bed and oxygen.  The oxygen is the barrier for home discharge  due to need for high flow.

## 2016-02-18 ENCOUNTER — Inpatient Hospital Stay: Payer: PPO

## 2016-02-18 LAB — BASIC METABOLIC PANEL
Anion gap: 6 (ref 5–15)
BUN: 19 mg/dL (ref 6–20)
CALCIUM: 8.4 mg/dL — AB (ref 8.9–10.3)
CO2: 35 mmol/L — AB (ref 22–32)
CREATININE: 0.46 mg/dL — AB (ref 0.61–1.24)
Chloride: 96 mmol/L — ABNORMAL LOW (ref 101–111)
GFR calc Af Amer: >60 mL/min (ref >60)
GFR calc non Af Amer: >60 mL/min (ref >60)
GLUCOSE: 167 mg/dL — AB (ref 65–99)
Potassium: 3.7 mmol/L (ref 3.5–5.1)
Sodium: 137 mmol/L (ref 135–145)

## 2016-02-18 MED ORDER — PREDNISONE 10 MG PO TABS: 10.0000 mg | ORAL_TABLET | Freq: Every day | ORAL | 0 refills | Status: DC

## 2016-02-18 MED ORDER — NICOTINE 21 MG/24HR TD PT24: 21.0000 mg | MEDICATED_PATCH | Freq: Every day | TRANSDERMAL | 0 refills | Status: AC

## 2016-02-18 NOTE — Progress Notes (Signed)
Daily Progress Note   Patient Name: Rick Clark       Date: 02/18/2016 DOB: 09-15-1945  Age: 71 y.o. MRN#: [id number] Attending Physician: Bettey Costa, MD Primary Care Physician: [name], MD Admit Date: 02/10/2016  Reason for Consultation/Follow-up: Disposition, Establishing goals of care and Hospice Evaluation in regard to respiratory failure, lung cancer.  Subjective: Rick Clark' condition is notably improved, both in general appearance and in reliance on supplemental O2.  Last night, he was switched from high-flow ~35L to mask at 12L, after which his sats remained high.  He has since been incrementally weaned down to 2-3L by nasal canula, and continues to sat in the mid-90's.  He has eaten a couple times since we saw him yesterday.  He tells [place] he feels better, which in itself is an improvement as he would have had to communicate that in writing yesterday.  His daughter is with [place] as we discuss his improved prognosis and resulting treatment trajectory.  We believe yesterday's plan to d/c to [place] is no longer appropriate, and would like to assess his functionality and further treatment options in the hospital before discharge, which will likely be home with either home health/[facility] or hospice.  Both Rick Clark and his daughter agree with this plan.   Patient Profile/HPI: 71 y.o. male  with past medical history of lung cancer and COPD who was admitted on 02/10/2016 with acute on chronic respiratory failure.  CXR showed extensive RLL pneumonia.  Rick Clark has been treated aggressively with steroids and antibiotics.  He remains in the ICU, but was transitioned from 35L of high flow to 2L O2 by nasal canula last night.  He has maintained high perfusion levels  since.   Length of Stay: 8  Current Medications: Scheduled Meds:  . budesonide (PULMICORT) nebulizer solution  0.25 mg Nebulization Q6H  . ceFEPime (MAXIPIME) IV  2 g Intravenous Q24H  . enoxaparin (LOVENOX) injection  30 mg Subcutaneous Q24H  . feeding supplement (ENSURE ENLIVE)  237 mL Oral BID BM  . ipratropium-albuterol  3 mL Nebulization Q6H  . mouth rinse  15 mL Mouth Rinse BID  . methylPREDNISolone (SOLU-MEDROL) injection  60 mg Intravenous Q24H  . nicotine  21 mg Transdermal Daily  . polyethylene glycol  17 g Oral Daily  .  sodium chloride flush  3 mL Intravenous Q12H    Continuous Infusions:   PRN Meds: acetaminophen **OR** acetaminophen, albuterol, morphine injection, morphine CONCENTRATE, [DISCONTINUED] ondansetron **OR** ondansetron (ZOFRAN) IV, temazepam  Physical Exam  Cachectic, weak appearance Alert and oriented, sitting up in bed More verbal than yesterday Comfortable, without agitation  Appropriate affect, positive mood   Vital Signs: BP (!) 112/58   Pulse 84   Temp 97.8 F (36.6 C) (Oral)   Resp (!) 22   Ht '5\' 5"'$  (1.651 m)   Wt 36.5 kg (80 lb 8 oz)   SpO2 100%   BMI 13.40 kg/m  SpO2: SpO2: 100 % O2 Device: O2 Device: Nasal Cannula O2 Flow Rate: O2 Flow Rate (L/min): 4 L/min  Intake/output summary:  Intake/Output Summary (Last 24 hours) at 02/18/16 1050 Last data filed at 02/18/16 0848  Gross per 24 hour  Intake               50 ml  Output              825 ml  Net             -775 ml   LBM: Last BM Date: 02/13/16 Baseline Weight: Weight: 36.5 kg (80 lb 9 oz) Most recent weight: Weight: 36.5 kg (80 lb 8 oz)       Palliative Assessment/Data:    Flowsheet Rows   Flowsheet Row Most Recent Value  Intake Tab  Referral Department  Critical care  Unit at Time of Referral  ICU  Palliative Care Primary Diagnosis  Pulmonary  Date Notified  02/16/16  Palliative Care Type  New Palliative care  Reason for referral  Non-pain Symptom, Counsel  Regarding Hospice, End of Life Care Assistance  Date of Admission  02/10/16  Date first seen by Palliative Care  02/17/16  # of days Palliative referral response time  1 Day(s)  # of days IP prior to Palliative referral  6  Clinical Assessment  Palliative Performance Scale Score  20%  Psychosocial & Spiritual Assessment  Palliative Care Outcomes  Patient/Family meeting held?  Yes  Who was at the meeting?  patient, dtr, nehew  Palliative Care Outcomes  Improved non-pain symptom therapy, Clarified goals of care, Counseled regarding hospice, Provided end of life care assistance      Patient Active Problem List   Diagnosis Date Noted  . Palliative care encounter   . Goals of care, counseling/discussion   . Encounter for hospice care discussion   . Elevated troponin   . Acute on chronic respiratory failure with hypoxia ([place]) 02/10/2016  . Hypotension 02/10/2016  . Generalized weakness 02/10/2016  . Sepsis ([place]) 02/04/2016  . Malignant neoplasm of upper lobe of left lung ([place]) 08/05/2015  . Pneumothorax 06/24/2015  . Pneumothorax, left   . Protein-calorie malnutrition, severe ([place]) 06/24/2014  . Parapneumonic effusion 06/23/2014  . Failure to thrive in adult 06/23/2014  . Pneumonia due to infectious organism 06/22/2014    Palliative Care Plan    Recommendations/Plan:  PT/OT evaluation - per pt, has not been out of bed for 8 days, need to assess functionality  [facility] consult if indicated by PT/OT eval vs Palliative w/ PT vs Hospice services at home.   The patient has advanced lung cancer and will continue to decline.  Family's primary interest is his comfort.  Goals of Care and Additional Recommendations:  Limitations on Scope of Treatment: Full Scope Treatment  Code Status:  DNR  Prognosis:   <  6 months due to lung cancer progression  Discharge Planning:  To Be Determined, anticipate home with either [facility] or hospice  Care plan was discussed with patient,  patient's daughter, Rick Clark, and Rick Clark  Thank you for allowing the Palliative Medicine Team to assist in the care of this patient.  Total time spent:  30 minutes     Greater than 50%  of this time was spent counseling and coordinating care related to the above assessment and plan.  Rick Floss, PA-S Imogene Burn, [place] [facility] Pager: [phone]   Please contact Palliative MedicineTeam phone at [phone] for questions and concerns between 7 am - 7 pm.   Please see AMION for individual provider pager numbers.

## 2016-02-18 NOTE — Discharge Summary (Signed)
[facility] NAME: Rick Clark    MR#:  [id number]  DATE OF BIRTH:  10-01-1945  DATE OF ADMISSION:  02/10/2016 ADMITTING PHYSICIAN: Dustin Flock, MD  DATE OF DISCHARGE: 02/18/2016  PRIMARY CARE PHYSICIAN: [name], MD    ADMISSION DIAGNOSIS:  Recurrent PNA, Acute Respiratory Failure with Hypoxia  DISCHARGE DIAGNOSIS:  Active Problems:   Pneumonia due to infectious organism   Acute on chronic respiratory failure with hypoxia (HCC)   Hypotension   Generalized weakness   Elevated troponin   Palliative care encounter   Goals of care, counseling/discussion   Encounter for hospice care discussion   SECONDARY DIAGNOSIS:   Past Medical History:  Diagnosis Date  . Asthma   . COPD (chronic obstructive pulmonary disease) ([place])   . H/O cardiovascular stress test    a. 02/2015 Myoview: no ischemia/infarct, EF 55-65%.  . Lung cancer [facility]) 1990   Patient states RUL Thoracotomy.  . Lung mass    a. spiculated LUL lung mass;  b. 01/2016 Chest CT: LUL nodule 0.9 x 0.9 cm - improved from 1.2 x 1.1 cm.  . Pneumonia    a. 11/2013 with admission 01/2016 as well.    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 Acute on chronic hypoxic respiratory failure in the setting of end-stage COPD and community-acquired pneumonia: His Hospital course was prolonged by increasing oxygen requirements. He was placed on high flow oxygen for several days however he is now back on his baseline at 2 L of oxygen.  2. Acute COPD exacerbation He will be discharged with steroid taper 3. HCAP: He was on cefepime and has a prescription for Levaquin to complete therapy  4. Elevated troponin due to demand ischemia and not ACS  5. Dysphagia due to weakness on  dysphagia diet for now but may resume regular diet as tolerated  He would benefit from [facility] hospice/home health at discharge.   DISCHARGE CONDITIONS AND DIET:   Stable for discharge Diet as above  CONSULTS OBTAINED:  Treatment Team:  Allyne Gee, MD  DRUG ALLERGIES:  No Known Allergies  DISCHARGE MEDICATIONS:   Current Discharge Medication List    START taking these medications   Details  nicotine (NICODERM CQ - DOSED IN MG/24 HOURS) 21 mg/24hr patch Place 1 patch (21 mg total) onto the skin daily. Qty: 28 patch, Refills: 0    predniSONE (DELTASONE) 10 MG tablet Take 1 tablet (10 mg total) by mouth daily with breakfast. 60 mg PO (ORAL) x 2 days 50 mg PO (ORAL)  x 2 days 40 mg PO (ORAL)  x 2 days 30 mg PO  (ORAL)  x 2 days 20 mg PO  (ORAL) x 2 days 10 mg PO  (ORAL) x 2 days then stop Qty: 42 tablet, Refills: 0      CONTINUE these medications which have NOT CHANGED   Details  albuterol (PROVENTIL HFA;VENTOLIN HFA) 108 (90 BASE) MCG/ACT inhaler Inhale 1 puff into the lungs every 6 (six) hours as needed for wheezing or shortness of breath.    budesonide-formoterol (SYMBICORT) 160-4.5 MCG/ACT inhaler Inhale 2 puffs into the lungs 2 (two) times daily.    clotrimazole-betamethasone (LOTRISONE) cream Apply 1 application topically 2 (two) times daily.     tiotropium (SPIRIVA) 18 MCG inhalation  capsule Place 18 mcg into inhaler and inhale daily.    triamcinolone cream (KENALOG) 0.1 % Apply 1 application topically 2 (two) times daily.     aspirin 81 MG chewable tablet Chew 81 mg by mouth every morning.    feeding supplement, ENSURE ENLIVE, (ENSURE ENLIVE) LIQD Take 237 mLs by mouth 2 (two) times daily between meals. Qty: 237 mL, Refills: 12    levofloxacin (LEVAQUIN) 750 MG tablet Take 1 tablet (750 mg total) by mouth daily. Qty: 5 tablet, Refills: 0              Today   CHIEF COMPLAINT:  Patient is doing remarkably well.   VITAL SIGNS:  Blood  pressure (!) 112/58, pulse 84, temperature 97.8 F (36.6 C), temperature source Oral, resp. rate (!) 22, height '5\' 5"'$  (1.651 m), weight 36.5 kg (80 lb 8 oz), SpO2 100 %.   REVIEW OF SYSTEMS:  Review of Systems  Constitutional: Positive for malaise/fatigue. Negative for chills and fever.  HENT: Negative.  Negative for ear discharge, ear pain, hearing loss, nosebleeds and sore throat.   Eyes: Negative.  Negative for blurred vision and pain.  Respiratory: Positive for cough. Negative for hemoptysis, shortness of breath and wheezing.   Cardiovascular: Negative.  Negative for chest pain, palpitations and leg swelling.  Gastrointestinal: Negative.  Negative for abdominal pain, blood in stool, diarrhea, nausea and vomiting.  Genitourinary: Negative.  Negative for dysuria.  Musculoskeletal: Negative.  Negative for back pain.  Skin: Negative.   Neurological: Positive for weakness. Negative for dizziness, tremors, speech change, focal weakness, seizures and headaches.  Endo/Heme/Allergies: Negative.  Does not bruise/bleed easily.  Psychiatric/Behavioral: Negative.  Negative for depression, hallucinations and suicidal ideas.     PHYSICAL EXAMINATION:  GENERAL:  71 y.o.-year-old patient lying in the bed with no acute distress. Very thin and frail NECK:  Supple, no jugular venous distention. No thyroid enlargement, no tenderness.  LUNGS: decreased breath sounds bilaterally, no wheezing, rales,rhonchi  No use of accessory muscles of respiration.  CARDIOVASCULAR: S1, S2 normal. No murmurs, rubs, or gallops.  ABDOMEN: Soft, non-tender, non-distended. Bowel sounds present. No organomegaly or mass.  EXTREMITIES: No pedal edema, cyanosis, or clubbing.  PSYCHIATRIC: The patient is alert and oriented x 3.  SKIN: No obvious rash, lesion, or ulcer.   DATA REVIEW:   CBC  Recent Labs Lab 02/15/16 0632  WBC 25.1*  HGB 12.0*  HCT 38.0*  PLT 644*    Chemistries   Recent Labs Lab 02/18/16 0445   NA 137  K 3.7  CL 96*  CO2 35*  GLUCOSE 167*  BUN 19  CREATININE 0.46*  CALCIUM 8.4*    Cardiac Enzymes No results for input(s): TROPONINI in the last 168 hours.  Microbiology Results  '@MICRORSLT48'$ @  RADIOLOGY:  Dg Chest 1 View  Result Date: 02/18/2016 CLINICAL DATA:  Dyspnea EXAM: CHEST 1 VIEW COMPARISON:  02/14/2016, 02/04/2016, 02/04/2016 FINDINGS: The lungs are hyperinflated. Linear scarring or atelectasis at the left lung base. Postsurgical changes at the right hilum. Slight interval worsening of airspace disease in the right lung base. Continued hazy airspace disease in the right upper lobe. Suspect small right pleural effusion. Stable cardiomediastinal silhouette. No pneumothorax. IMPRESSION: Slight interval worsening of airspace disease in the right lung base with suspected small right effusion. Diffuse right-sided pulmonary infiltrates are otherwise unchanged. Electronically Signed   By: Donavan Foil M.D.   On: 02/18/2016 03:36      Management plans discussed with the patient and family  and they are in agreement. Stable for discharge   Patient should follow up with pcp  CODE STATUS:     Code Status Orders        Start     Ordered   02/15/16 0902  Do not attempt resuscitation (DNR)  Continuous    Question Answer Comment  In the event of cardiac or respiratory ARREST Do not call a "code blue"   In the event of cardiac or respiratory ARREST Do not perform Intubation, CPR, defibrillation or ACLS   In the event of cardiac or respiratory ARREST Use medication by any route, position, wound care, and other measures to relive pain and suffering. May use oxygen, suction and manual treatment of airway obstruction as needed for comfort.      02/15/16 0905    Code Status History    Date Active Date Inactive Code Status Order ID Comments User Context   02/10/2016  2:03 PM 02/15/2016  9:05 AM Full Code [id number]  Theodoro Grist, MD Inpatient   02/04/2016  4:53 PM 02/05/2016   7:57 PM Full Code [id number]  Demetrios Loll, MD Inpatient   06/24/2015  5:15 PM 06/28/2015  1:45 PM Full Code [id number]  [name], MD Inpatient   05/19/2015  4:26 PM 05/22/2015  1:29 PM Full Code [id number]  Gladstone Lighter, MD ED   06/25/2014  2:53 PM 07/02/2014  8:33 PM Full Code [id number]  Inez Catalina, MD Inpatient   06/22/2014 10:59 PM 06/25/2014  2:53 PM Full Code [id number]  Harrie Foreman, MD ED    Advance Directive Documentation   Flowsheet Row Most Recent Value  Type of Advance Directive  Healthcare Power of Attorney, Living will  Pre-existing out of facility DNR order (yellow form or pink MOST form)  No data  "MOST" Form in Place?  No data      TOTAL TIME TAKING CARE OF THIS PATIENT: 38 minutes.    Note: This dictation was prepared with Dragon dictation along with smaller phrase technology. Any transcriptional errors that result from this process are unintentional.  Nahia Nissan M.D on 02/18/2016 at 11:39 AM  Between 7am to 6pm - Pager - [phone] After 6pm go to [email or web address] - password [email or web address] [facility]  Office  [phone]  CC: Primary care physician; [name], MD

## 2016-02-18 NOTE — Progress Notes (Signed)
Patient alert and oriented.  Vitals stable- Patient on 3 liters nasal cannula- Sating at 92%. Orders to keep oxygen sats 85% and above.  Patient did get anxious and short of breath- oxygen sats 82% and patient was given morphine '2mg'$  IV which helped with his breathing. Patient was to be discharged home with home health after PT evaluation but patient and family stated that they would like to stay 1 more day in the hospital.  I relayed this information to Dr. Benjie Karvonen- she ordered to discontinue her discharge order.  Patient to transfer to floor.

## 2016-02-18 NOTE — Care Management (Addendum)
Patient has improved per palliative team. He is now on 2 liters O2 wanting to go home. I have notified kara with [facility] of patient wish. PT/OT evaluation ordered per Dr. Juanell Fairly. If O2 is acute, patient will need O2 assessment/template entered in order to arrange home O2.

## 2016-02-18 NOTE — Progress Notes (Signed)
  Pharmacy Antibiotic Note  Rick Clark is a 71 y.o. male with a h/o COPD and lung CA with recent hospital admission for PNA admitted on 02/10/2016 with pneumonia. Patient transferred to ICU on 02/14/16 and antibiotics broadened. Pharmacy consulted for cefepime dosing.   Plan: Continue cefepime 2g IV Q24hr. Will ask for stop date today.   Height: '5\' 5"'$  (165.1 cm) Weight: 80 lb 8 oz (36.5 kg) IBW/kg (Calculated) : 61.5  Temp (24hrs), Avg:98 F (36.7 C), Min:97.6 F (36.4 C), Max:98.6 F (37 C)   Recent Labs Lab 02/14/16 0642 02/15/16 0632 02/17/16 0441 02/18/16 0445  WBC 21.6* 25.1*  --   --   CREATININE <0.30* 0.41* 0.37* 0.46*    Estimated Creatinine Clearance: 43.1 mL/min (by C-G formula based on SCr of 0.46 mg/dL (L)).    No Known Allergies  Antimicrobials this admission: vancomycin 1/29 >> 1/30, 2/1 >>2/4 Cefepime  1/29 >>  Azithromycin 2/2 >> 2/4  Dose adjustments this admission: 2/5 Cefepime interval changed to Q24hr.   Microbiology results: 1/29 BCx: no growth  1/29 Sputum: normal flora  2/2 MRSA PCR: negative    Pharmacy will continue to monitor and adjust per consult.    Loree Fee, PharmD 02/18/2016 11:37 AM

## 2016-02-18 NOTE — Evaluation (Signed)
Occupational Therapy Evaluation Patient Details Name: Rick Clark MRN: [id number] DOB: [birth year] Today's Date: 02/18/2016    History of Present Illness Rick Clark  is a 71 y.o. male with a known history of COPD, chronic respiratory failure, lung cancer, status post right upper thoracotomy, new. Hospitalist related left upper lobe lung mass, which is improving in size according to most recent CT scan about a week ago, history of recent admission to the hospital for pneumonia, was treated with Rocephin and Zithromax in the hospital and discharged on levofloxacin, comes back to the hospital with significant shortness of breath, not feeling well, very weak, not able to lift up phlegm, rattling in the chest. One to patient, he improved in the hospital when he stated from the 27 to 02/05/2016 on antibiotic therapy, however, over the past 2 or 3 days. He's been having significant worsening of his condition, he is having a runny nose, dry mouth, pains in his shoulders, rattling in the chest, wheezing, shortness of breath, inefficient cough read. He was seen by Dr. Humphrey Rolls, his primary pulmonologist, who sent him as direct admit to the hospital..   Clinical Impression   Pt is 71 year old male who presents to [facility] hospital with above deficits and diagnoses.  Pt currently requires rest breaks and supervision for ADLs due to SOB, decreased endurance for functional tasks and at risk for falls.  This was his baseline prior to admission and could benefit from further OT but declined any further therapy due to having family to assist with anything he was not able to do secondary to SOB, decreased endurance. Pt has all equipment needed at home including a shower chair, hospital bed, urinal, and receiving a BSC today.  Pt seen for evaluation only and no further OT services.  He could benefit from services but declined due to having family to assist as needed and hospice services already in place.       Follow Up Recommendations  No OT follow up    Equipment Recommendations       Recommendations for Other Services       Precautions / Restrictions Precautions Precautions: Fall Restrictions Weight Bearing Restrictions: No      Mobility Bed Mobility                  Transfers                      Balance                                            ADL Overall ADL's : At baseline                                       General ADL Comments: Pt feeling weak but is at baseline for ADLs as far as feeding, grooming skills, using urinal.  He has assistance at home from his grand daughter and his ex-wife who is CNA and plan is to have assist as needed from them.  He will also be receiving Hospice care at home and declined OT services at home.     Vision     Perception     Praxis      Pertinent Vitals/Pain Pain Assessment: No/denies pain  Hand Dominance Right   Extremity/Trunk Assessment Upper Extremity Assessment Upper Extremity Assessment: Generalized weakness   Lower Extremity Assessment Lower Extremity Assessment: Defer to PT evaluation       Communication Communication Communication: Other (comment) (decreased resp support to voice responses but able to for short conversation and one to two word answers, has pen and paper as well)   Cognition Arousal/Alertness: Awake/alert Behavior During Therapy: WFL for tasks assessed/performed Overall Cognitive Status: Within Functional Limits for tasks assessed                 General Comments: Needs rest breaks for SOB and decreased sats due to not being very mobile recently with pneumonia and spitting up a lot of mucus   General Comments       Exercises       Shoulder Instructions      Home Living Family/patient expects to be discharged to:: Private residence Living Arrangements: Children Available Help at Discharge: Family;Available 24  hours/day Type of Home: House Home Access: Stairs to enter [employer] of Steps: 2 Entrance Stairs-Rails: None Home Layout: One level               Home Equipment: None          Prior Functioning/Environment Level of Independence: Independent                 OT Problem List:     OT Treatment/Interventions:      OT Goals(Current goals can be found in the care plan section)    OT Frequency:     Barriers to D/C:            Co-evaluation              End of Session Nurse Communication: Other (comment) (gave him urinal, wash cloth, towels and Kleenex)  Activity Tolerance: Patient tolerated treatment well Patient left: in bed;with family/visitor present;with call bell/phone within reach   Time: 1445-1514 OT Time Calculation (min): 29 min Charges:  OT General Charges $OT Visit: 1 Procedure OT Evaluation $OT Eval Low Complexity: 1 Procedure OT Treatments $Self Care/Home Management : 8-22 mins G-Codes:    Chrys Racer, OTR/L 02/18/16, 3:29 PM

## 2016-02-18 NOTE — Progress Notes (Signed)
No charge note.  Goal is to wean patient to 15L at 35% fiO2 to be able to discharge to hospice house.

## 2016-02-18 NOTE — Evaluation (Addendum)
Physical Therapy RE-Evaluation Patient Details Name: Rick Clark MRN: [id number] DOB: 01-Feb-1945 Today's Date: 02/18/2016   History of Present Illness  Pt admitted with complaints of fatigue and SOB. Now diagnosed with HCAP. History includes COPD, lung cancer, and pneumonia. Stay complicated by CCU admission secondary to respiratory distress requiring hi-flow. Pt now on 4L of O2.  Clinical Impression  Pt is a pleasant 71 year old male who was admitted for pneumonia. Pt performs bed mobility with independence, transfers with min assist, and ambulation with min assist and HHA. Evaluation limited as pt transferring from CCU to regular room. Pt agreeable to attempt to ambulate from CCU bed to transport bed. Pt demonstrates deficits with strength/mobility/endurance. All mobility performed on 4L of O2 with slight SOB symptoms noted. Sats WNL.  Would benefit from skilled PT to address above deficits and promote optimal return to PLOF. Recommend transition to [place] upon discharge from acute hospitalization. Pt plans to dc home with family and hospice services.       Follow Up Recommendations Home health PT;Supervision/Assistance - 24 hour    Equipment Recommendations  Hospital bed;Wheelchair (measurements PT);Rolling walker with 5" wheels;3in1 (PT)    Recommendations for Other Services       Precautions / Restrictions Precautions Precautions: Fall Restrictions Weight Bearing Restrictions: No      Mobility  Bed Mobility Overal bed mobility: Independent             General bed mobility comments: safely moves from supine to sitting   Transfers Overall transfer level: Needs assistance Equipment used: None Transfers: Sit to/from Stand Sit to Stand: Min assist         General transfer comment: transfer requiring HHA. Slight unsteadiness noted with upright position  Ambulation/Gait Ambulation/Gait assistance: Min assist Ambulation Distance (Feet): 10 Feet Assistive  device: None Gait Pattern/deviations: Step-through pattern     General Gait Details: short shuffling gait pattern noted with min assist using HHA. Would benefit from RW for further ambulation  Stairs            Wheelchair Mobility    Modified Rankin (Stroke Patients Only)       Balance Overall balance assessment: Needs assistance Sitting-balance support: No upper extremity supported;Feet unsupported Sitting balance-Leahy Scale: Good     Standing balance support: Single extremity supported Standing balance-Leahy Scale: Fair                               Pertinent Vitals/Pain Pain Assessment: No/denies pain    Home Living Family/patient expects to be discharged to:: Private residence Living Arrangements: Children Available Help at Discharge: Family;Available 24 hours/day Type of Home: House Home Access: Stairs to enter Entrance Stairs-Rails: None Entrance Stairs-Number of Steps: 2 Home Layout: One level Home Equipment: None      Prior Function Level of Independence: Independent         Comments: Pt still working job as part Orthoptist and very active with ambulation without AD     Hand Dominance        Extremity/Trunk Assessment   Upper Extremity Assessment Upper Extremity Assessment: Generalized weakness (appeared grossly 3+/5)    Lower Extremity Assessment Lower Extremity Assessment: Generalized weakness (grossly 4/5)       Communication   Communication: No difficulties  Cognition Arousal/Alertness: Awake/alert Behavior During Therapy: WFL for tasks assessed/performed Overall Cognitive Status: Within Functional Limits for tasks assessed  General Comments      Exercises     Assessment/Plan    PT Assessment Patient needs continued PT services  PT Problem List Decreased strength;Decreased activity tolerance;Decreased balance;Decreased mobility          PT Treatment  Interventions Gait training;Therapeutic activities;Therapeutic exercise    PT Goals (Current goals can be found in the Care Plan section)  Acute Rehab PT Goals Patient Stated Goal: to go home with hospice PT Goal Formulation: With patient Time For Goal Achievement: 03/03/16 Potential to Achieve Goals: Fair    Frequency Min 2X/week   Barriers to discharge        Co-evaluation               End of Session Equipment Utilized During Treatment: Gait belt;Oxygen Activity Tolerance: Patient limited by fatigue;Treatment limited secondary to medical complications (Comment) Patient left: with nursing/sitter in room;in bed Nurse Communication: Mobility status         Time: 9030-0923 PT Time Calculation (min) (ACUTE ONLY): 9 min   Charges:   PT Evaluation $PT Re-evaluation: 1 Procedure     PT G Codes:        Justo Hengel 03-15-16, 9:00 PM  Greggory Stallion, PT, DPT [phone]

## 2016-02-18 NOTE — Progress Notes (Signed)
New referral for hospice at home upon discharge from [facility] on 70 year old gentleman who was admitted on 1.29.18 with Right pneumonia and Acute respiratory failure with hypoxia. Patient has past medical history of squamous cell cancer of the lung, end stage COPD, and asthma.  During this hospital stay, patient required high flow oxygen for several days, but was able to be weaned down to 2-3 liters via nasal cannula.  I met with Rick Clark in the CCU and his daughter to discuss hospice services.  All are in agreement with and request services upon discharge to [facility].  Team wanted to discharge home today, however, Rick Clark verbalized he is not ready to discharge and his daughter needs time to get the house arranged for new need for oxygen and a hospital bed.  Patient is very dyspneic at rest and is unable to say more than a few words due to dyspnea.  He writes down most things.  Spoke with the team at [facility] of concerns of discharging him home too early with the recent transition to nasal cannula, and patient's current dyspnea.  Family and patient really want PT evaluation prior to discharge home since he has been in the bed since hospitalization.  I doubt he will tolerate any activity due to his dyspnea at rest.  DME needs:  Hospital bed, oxygen concentrator and portable tanks, BSC, wheelchair, and over bed table.  Referral information faxed to referral intake.  Plan is for patient to be transferred to the floor and to discharge when patient is more stable.  Thank you for allowing participation in this patient's care. ° °Kara H. Marshall, RN °Clinical Nurse Liaison °[facility] °

## 2016-02-18 NOTE — Progress Notes (Addendum)
[facility] at [place] NAME: Rick Clark    MR#:  [id number]  DATE OF BIRTH:  Apr 20, 1945  SUBJECTIVE:   Patient is on 2 l of O2 now  REVIEW OF SYSTEMS:    Review of Systems  Constitutional: Negative.  Negative for chills, fever and malaise/fatigue.  HENT: Negative.  Negative for ear discharge, ear pain, hearing loss, nosebleeds and sore throat.   Eyes: Negative.  Negative for blurred vision and pain.  Respiratory: Positive for cough. Negative for hemoptysis, shortness of breath and wheezing.   Cardiovascular: Negative.  Negative for chest pain, palpitations and leg swelling.  Gastrointestinal: Negative.  Negative for abdominal pain, blood in stool, diarrhea, nausea and vomiting.  Genitourinary: Negative.  Negative for dysuria.  Musculoskeletal: Negative.  Negative for back pain.  Skin: Negative.   Neurological: Negative for dizziness, tremors, speech change, focal weakness, seizures and headaches.  Endo/Heme/Allergies: Negative.  Does not bruise/bleed easily.  Psychiatric/Behavioral: Negative.  Negative for depression, hallucinations and suicidal ideas.    Tolerating Diet: yes      DRUG ALLERGIES:  No Known Allergies  VITALS:  Blood pressure 124/61, pulse (!) 101, temperature 97.8 F (36.6 C), temperature source Oral, resp. rate (!) 33, height '5\' 5"'$  (1.651 m), weight 36.5 kg (80 lb 8 oz), SpO2 100 %.  PHYSICAL EXAMINATION:   Physical Exam  Constitutional: He is oriented to person, place, and time. No distress.  Frail ill appearing weak  HENT:  Head: Normocephalic.  Eyes: No scleral icterus.  Neck: Normal range of motion. Neck supple. No JVD present. No tracheal deviation present.  Cardiovascular: Normal rate, regular rhythm and normal heart sounds.  Exam reveals no gallop and no friction rub.   No murmur heard. Pulmonary/Chest: Effort normal. No respiratory distress. He has no wheezes. He has no rales. He exhibits no  tenderness.  Decreased throughout Rhonchorous sounds right lung  Abdominal: Soft. Bowel sounds are normal. He exhibits no distension and no mass. There is no tenderness. There is no rebound and no guarding.  Musculoskeletal: Normal range of motion. He exhibits no edema.  Neurological: He is alert and oriented to person, place, and time.  Skin: Skin is warm. No rash noted. No erythema.  Psychiatric: Affect normal.      LABORATORY PANEL:   CBC  Recent Labs Lab 02/15/16 0632  WBC 25.1*  HGB 12.0*  HCT 38.0*  PLT 644*   ------------------------------------------------------------------------------------------------------------------  Chemistries   Recent Labs Lab 02/18/16 0445  NA 137  K 3.7  CL 96*  CO2 35*  GLUCOSE 167*  BUN 19  CREATININE 0.46*  CALCIUM 8.4*   ------------------------------------------------------------------------------------------------------------------  Cardiac Enzymes No results for input(s): TROPONINI in the last 168 hours. ------------------------------------------------------------------------------------------------------------------  RADIOLOGY:  Dg Chest 1 View  Result Date: 02/18/2016 CLINICAL DATA:  Dyspnea EXAM: CHEST 1 VIEW COMPARISON:  02/14/2016, 02/04/2016, 02/04/2016 FINDINGS: The lungs are hyperinflated. Linear scarring or atelectasis at the left lung base. Postsurgical changes at the right hilum. Slight interval worsening of airspace disease in the right lung base. Continued hazy airspace disease in the right upper lobe. Suspect small right pleural effusion. Stable cardiomediastinal silhouette. No pneumothorax. IMPRESSION: Slight interval worsening of airspace disease in the right lung base with suspected small right effusion. Diffuse right-sided pulmonary infiltrates are otherwise unchanged. Electronically Signed   By: Donavan Foil M.D.   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 Acute on chronic hypoxic respiratory failure in the setting of end-stage COPD and community-acquired pneumonia: His Hospital course was prolonged by increasing oxygen requirements. He was placed on high flow oxygen for several days however he is now back on his baseline at 2 L of oxygen.  2. Acute COPD exacerbation He will be discharged with steroid taper.  3. HCAP: He was on cefepime and has a prescription for Levaquin to complete therapy at discahrge  4. Elevated troponin due to demand ischemia and not ACS  5. Dysphagia due to weakness on dysphagia diet for now but may resume regular diet as tolerated Management plans discussed with the patient and daughter and they are in agreement.  CODE STATUS: DNR   TOTAL TIME TAKING CARE OF THIS PATIENT: 22 minutes.     POSSIBLE D/C tomorrow, DEPENDING ON CLINICAL CONDITION.   Viyan Rosamond M.D on 02/18/2016 at 12:08 PM  Between 7am to 6pm - Pager - [phone] After 6pm go to [email or web address] - password [email or web address] [facility]  Office  [phone]  CC: Primary care physician; [name], MD  Note: This dictation was prepared with Dragon dictation along with smaller phrase technology. Any transcriptional errors that result from this process are unintentional.

## 2016-02-18 NOTE — Clinical Social Work Note (Signed)
CSW noticed Palliative Care note is for patient to go to hospice home, however, RN CM states this morning that patient refused hospice home and is going to go home with hospice. Please re-consult CSW should needs arise. Shela Leff MSW,LCSW [phone]

## 2016-02-19 LAB — BASIC METABOLIC PANEL
ANION GAP: 8 (ref 5–15)
BUN: 19 mg/dL (ref 6–20)
CALCIUM: 8.5 mg/dL — AB (ref 8.9–10.3)
CO2: 36 mmol/L — ABNORMAL HIGH (ref 22–32)
Chloride: 96 mmol/L — ABNORMAL LOW (ref 101–111)
Creatinine, Ser: <0.3 mg/dL — ABNORMAL LOW (ref 0.61–1.24)
Glucose, Bld: 137 mg/dL — ABNORMAL HIGH (ref 65–99)
POTASSIUM: 3.8 mmol/L (ref 3.5–5.1)
SODIUM: 140 mmol/L (ref 135–145)

## 2016-02-19 MED ORDER — PREDNISONE 50 MG PO TABS
50.0000 mg | ORAL_TABLET | Freq: Every day | ORAL | Status: DC
  Administered 2016-02-19: 13:00:00 50 mg via ORAL
  Filled 2016-02-19: qty 1

## 2016-02-19 NOTE — Discharge Summary (Signed)
[place] at [place] NAME: Rick Clark    MR#:  [id number]  DATE OF BIRTH:  February 08, 1945  DATE OF ADMISSION:  02/10/2016 ADMITTING PHYSICIAN: Dustin Flock, MD  DATE OF DISCHARGE: 02/19/2016  PRIMARY CARE PHYSICIAN: [name], MD    ADMISSION DIAGNOSIS:  Recurrent PNA, Acute Respiratory Failure with Hypoxia  DISCHARGE DIAGNOSIS:  Active Problems:   Pneumonia due to infectious organism   Acute on chronic respiratory failure with hypoxia (HCC)   Hypotension   Generalized weakness   Elevated troponin   Palliative care encounter   Goals of care, counseling/discussion   Encounter for hospice care discussion   SECONDARY DIAGNOSIS:   Past Medical History:  Diagnosis Date  . Asthma   . COPD (chronic obstructive pulmonary disease) ([place])   . H/O cardiovascular stress test    a. 02/2015 Myoview: no ischemia/infarct, EF 55-65%.  . Lung cancer [facility]) 1990   Patient states RUL Thoracotomy.  . Lung mass    a. spiculated LUL lung mass;  b. 01/2016 Chest CT: LUL nodule 0.9 x 0.9 cm - improved from 1.2 x 1.1 cm.  . Pneumonia    a. 11/2013 with admission 01/2016 as well.    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 Acute on chronic hypoxic respiratory failure in the setting of end-stage COPD and community-acquired pneumonia: His Hospital course was prolonged by increasing oxygen requirements. He was placed on high flow oxygen for several days however he is now back on his baseline at 2 L of oxygen.  2. Acute COPD exacerbation He is discharged with steroid taper 3. HCAP: He was on cefepime and has completed therapy for 10 days.  4. Elevated troponin due to demand ischemia and not ACS  5. Dysphagia due to weakness on dysphagia diet for now but  may resume regular diet as tolerated.   DISCHARGE CONDITIONS AND DIET:  The patient was discharged to home with hospice care.  CONSULTS OBTAINED:  Treatment Team:  Allyne Gee, MD  DRUG ALLERGIES:  No Known Allergies  DISCHARGE MEDICATIONS:   Discharge Medication List as of 02/19/2016 11:30 AM    START taking these medications   Details  nicotine (NICODERM CQ - DOSED IN MG/24 HOURS) 21 mg/24hr patch Place 1 patch (21 mg total) onto the skin daily., Starting Wed 02/19/2016, Normal    predniSONE (DELTASONE) 10 MG tablet Take 1 tablet (10 mg total) by mouth daily with breakfast. 60 mg PO (ORAL) x 2 days 50 mg PO (ORAL)  x 2 days 40 mg PO (ORAL)  x 2 days 30 mg PO  (ORAL)  x 2 days 20 mg PO  (ORAL) x 2 days 10 mg PO  (ORAL) x 2 days then stop, Starting Tue 02/18/2016,  Print      CONTINUE these medications which have NOT CHANGED   Details  albuterol (PROVENTIL HFA;VENTOLIN HFA) 108 (90 BASE) MCG/ACT inhaler Inhale 1 puff into the lungs every 6 (six) hours as needed for wheezing or shortness of breath., Until Discontinued, Historical Med    budesonide-formoterol (SYMBICORT) 160-4.5 MCG/ACT inhaler Inhale 2 puffs into the lungs 2 (two) times daily., Until Discontinued, Historical Med    clotrimazole-betamethasone (LOTRISONE) cream Apply 1 application topically 2 (two) times daily. , Starting Tue 11/12/2015, Historical Med  tiotropium (SPIRIVA) 18 MCG inhalation capsule Place 18 mcg into inhaler and inhale daily., Until Discontinued, Historical Med    triamcinolone cream (KENALOG) 0.1 % Apply 1 application topically 2 (two) times daily. , Starting Tue 11/12/2015, Historical Med    aspirin 81 MG chewable tablet Chew 81 mg by mouth every morning., Until Discontinued, Historical Med    feeding supplement, ENSURE ENLIVE, (ENSURE ENLIVE) LIQD Take 237 mLs by mouth 2 (two) times daily between meals., Starting Wed 02/05/2016, Normal      STOP taking these medications     levofloxacin  (LEVAQUIN) 750 MG tablet               Today   CHIEF COMPLAINT:  Patient is doing remarkably well.   VITAL SIGNS:  Blood pressure (!) 109/47, pulse 75, temperature 97.7 F (36.5 C), temperature source Oral, resp. rate 17, height '5\' 5"'$  (1.651 m), weight 80 lb 8 oz (36.5 kg), SpO2 91 %.   REVIEW OF SYSTEMS:  Review of Systems  Constitutional: Positive for malaise/fatigue. Negative for chills and fever.  HENT: Negative.  Negative for ear discharge, ear pain, hearing loss, nosebleeds and sore throat.   Eyes: Negative.  Negative for blurred vision and pain.  Respiratory: Positive for cough. Negative for hemoptysis, shortness of breath and wheezing.   Cardiovascular: Negative.  Negative for chest pain, palpitations and leg swelling.  Gastrointestinal: Negative.  Negative for abdominal pain, blood in stool, diarrhea, nausea and vomiting.  Genitourinary: Negative.  Negative for dysuria.  Musculoskeletal: Negative.  Negative for back pain.  Skin: Negative.   Neurological: Positive for weakness. Negative for dizziness, tremors, speech change, focal weakness, seizures and headaches.  Endo/Heme/Allergies: Negative.  Does not bruise/bleed easily.  Psychiatric/Behavioral: Negative.  Negative for depression, hallucinations and suicidal ideas.     PHYSICAL EXAMINATION:  GENERAL:  71 y.o.-year-old patient lying in the bed with no acute distress. Very thin and frail NECK:  Supple, no jugular venous distention. No thyroid enlargement, no tenderness.  LUNGS: decreased breath sounds bilaterally, no wheezing, rales,rhonchi  No use of accessory muscles of respiration.  CARDIOVASCULAR: S1, S2 normal. No murmurs, rubs, or gallops.  ABDOMEN: Soft, non-tender, non-distended. Bowel sounds present. No organomegaly or mass.  EXTREMITIES: No pedal edema, cyanosis, or clubbing.  PSYCHIATRIC: The patient is alert and oriented x 3.  SKIN: No obvious rash, lesion, or ulcer.   DATA REVIEW:    CBC  Recent Labs Lab 02/15/16 0632  WBC 25.1*  HGB 12.0*  HCT 38.0*  PLT 644*    Chemistries   Recent Labs Lab 02/19/16 0409  NA 140  K 3.8  CL 96*  CO2 36*  GLUCOSE 137*  BUN 19  CREATININE <0.30*  CALCIUM 8.5*    Cardiac Enzymes No results for input(s): TROPONINI in the last 168 hours.  Microbiology Results  '@MICRORSLT48'$ @  RADIOLOGY:  Dg Chest 1 View  Result Date: 02/18/2016 CLINICAL DATA:  Dyspnea EXAM: CHEST 1 VIEW COMPARISON:  02/14/2016, 02/04/2016, 02/04/2016 FINDINGS: The lungs are hyperinflated. Linear scarring or atelectasis at the left lung base. Postsurgical changes at the right hilum. Slight interval worsening of airspace disease in the right lung base. Continued hazy airspace disease in the right upper lobe. Suspect small right pleural effusion. Stable cardiomediastinal silhouette. No pneumothorax. IMPRESSION: Slight interval worsening of airspace disease in the right lung base with suspected small right effusion. Diffuse right-sided pulmonary infiltrates are otherwise unchanged. Electronically Signed   By: Donavan Foil M.D.   On: 02/18/2016 03:36  Management plans discussed with the patient and his ex-wife and they are in agreement. Stable for discharge   Patient should follow up with pcp  CODE STATUS:     Code Status Orders        Start     Ordered   02/15/16 0902  Do not attempt resuscitation (DNR)  Continuous    Question Answer Comment  In the event of cardiac or respiratory ARREST Do not call a "code blue"   In the event of cardiac or respiratory ARREST Do not perform Intubation, CPR, defibrillation or ACLS   In the event of cardiac or respiratory ARREST Use medication by any route, position, wound care, and other measures to relive pain and suffering. May use oxygen, suction and manual treatment of airway obstruction as needed for comfort.      02/15/16 0905    Code Status History    Date Active Date Inactive Code Status Order  ID Comments User Context   02/10/2016  2:03 PM 02/15/2016  9:05 AM Full Code [id number]  Theodoro Grist, MD Inpatient   02/04/2016  4:53 PM 02/05/2016  7:57 PM Full Code [id number]  Demetrios Loll, MD Inpatient   06/24/2015  5:15 PM 06/28/2015  1:45 PM Full Code [id number]  [name], MD Inpatient   05/19/2015  4:26 PM 05/22/2015  1:29 PM Full Code [id number]  Gladstone Lighter, MD ED   06/25/2014  2:53 PM 07/02/2014  8:33 PM Full Code [id number]  Inez Catalina, MD Inpatient   06/22/2014 10:59 PM 06/25/2014  2:53 PM Full Code [id number]  Harrie Foreman, MD ED    Advance Directive Documentation   Flowsheet Row Most Recent Value  Type of Advance Directive  Healthcare Power of Attorney, Living will  Pre-existing out of facility DNR order (yellow form or pink MOST form)  No data  "MOST" Form in Place?  No data      TOTAL TIME TAKING CARE OF THIS PATIENT: 33 minutes.    Note: This dictation was prepared with Dragon dictation along with smaller phrase technology. Any transcriptional errors that result from this process are unintentional.  Demetrios Loll M.D on 02/19/2016 at 5:27 PM  Between 7am to 6pm - Pager - [phone] After 6pm go to [email or web address] - password [email or web address] [place]  Office  [phone]  CC: Primary care physician; [name], MD

## 2016-02-19 NOTE — Progress Notes (Signed)
Follow up visit made to new [facility] referral for services at home following discharge. Patient seen sitting up in bed, family at bedside. Confirmed discharge for today. DME in place. Patient plans to discharge via car. Referral made aware. Hospital care team aware. Thank you. [name] RN, BSN, [facility], hospital liaison [phone] c

## 2016-02-19 NOTE — Discharge Instructions (Signed)
Regular diet. Hospice care. Continue home O2 [place] 2-4 L

## 2016-02-19 NOTE — Progress Notes (Signed)
Patient d/ced home.  Home Health has provided hospital bed and BSC.  Hospice has been notified. DNR signed by dr.  Renee Rival IVs have been removed.  D/c instructions reviewed.Patient states he feels better.  Lung sounds are clear.  Patient will transport via EMS.

## 2016-02-19 NOTE — Care Management Important Message (Signed)
Important Message  Patient Details  Name: Rick Clark MRN: [id number] Date of Birth: 28-Jan-1945   Medicare Important Message Given:  Yes Copy of initial IM delivered.   Marshell Garfinkel, RN 02/19/2016, 8:09 AM

## 2016-02-19 NOTE — Care Management Note (Signed)
Case Management Note  Patient Details  Name: Rick Clark MRN: [id number] Date of Birth: 1945-09-16  Subjective/Objective:   Notified [name] with [name] of discharge. Medical Necessity completed. Spoke with patients daughter, Ms. [name] and she is in agreement with POC.                  Action/Plan:   Expected Discharge Date:  02/19/16               Expected Discharge Plan:  Home w Hospice Care  In-House Referral:     Discharge planning Services  CM Consult  Post Acute Care Choice:  Hospice Choice offered to:  Patient (Agency in network with health team advantage)  DME Arranged:    DME Agency:     HH Arranged:    [place] Agency:  [facility]  Status of Service:  Completed, signed off  If discussed at [place] of Stay Meetings, dates discussed:    Additional Comments:  Jolly Mango, RN 02/19/2016, 9:18 AM

## 2016-02-19 NOTE — Progress Notes (Signed)
Daily Progress Note   Patient Name: Rick Clark       Date: 02/19/2016 DOB: [birth year]  Age: 71 y.o. MRN#: [id number] Attending Physician: Demetrios Loll, MD Primary Care Physician: [name], MD Admit Date: 02/10/2016  Reason for Consultation/Follow-up: Disposition, Establishing goals of care and Hospice Evaluation in regard to respiratory failure, lung cancer.  Subjective: Rick Clark is looking forward to going home this morning.  Discussed his hospice care with daughter and son.  Son had reservations about discharge.  We discussed the inevitability of decline and eventual passing.  Offered support.  I gave the son my contact information for any further questions.  Family appreciative.   Patient Profile/HPI: 71 y.o. male  with past medical history of lung cancer and COPD who was admitted on 02/10/2016 with acute on chronic respiratory failure.  CXR showed extensive RLL pneumonia.  Mr. Rick Clark has been treated aggressively with steroids and antibiotics.  He remains in the ICU, but was transitioned from 35L of high flow to 2L O2 by nasal canula last night.  He has maintained high perfusion levels since.   Length of Stay: 9  Current Medications: Scheduled Meds:  . budesonide (PULMICORT) nebulizer solution  0.25 mg Nebulization Q6H  . ceFEPime (MAXIPIME) IV  2 g Intravenous Q24H  . enoxaparin (LOVENOX) injection  30 mg Subcutaneous Q24H  . feeding supplement (ENSURE ENLIVE)  237 mL Oral BID BM  . ipratropium-albuterol  3 mL Nebulization Q6H  . mouth rinse  15 mL Mouth Rinse BID  . nicotine  21 mg Transdermal Daily  . polyethylene glycol  17 g Oral Daily  . predniSONE  50 mg Oral Q breakfast  . sodium chloride flush  3 mL Intravenous Q12H    Continuous Infusions:   PRN  Meds: acetaminophen **OR** acetaminophen, albuterol, morphine injection, morphine CONCENTRATE, [DISCONTINUED] ondansetron **OR** ondansetron (ZOFRAN) IV, temazepam  Physical Exam  Cachectic, weak appearance, smiling. Tanned skin Alert and oriented, sitting up in bed Comfortable, without agitation  Appropriate affect, positive mood   Vital Signs: BP (!) 109/47 (BP Location: Left Arm)   Pulse 75   Temp 97.7 F (36.5 C) (Oral)   Resp 17   Ht '5\' 5"'$  (1.651 m)   Wt 36.5 kg (80 lb 8 oz)   SpO2 92%   BMI 13.40  kg/m  SpO2: SpO2: 92 % O2 Device: O2 Device: Nasal Cannula O2 Flow Rate: O2 Flow Rate (L/min): 4 L/min  Intake/output summary:   Intake/Output Summary (Last 24 hours) at 02/19/16 0948 Last data filed at 02/18/16 2108  Gross per 24 hour  Intake              240 ml  Output              200 ml  Net               40 ml   LBM: Last BM Date: 02/13/16 Baseline Weight: Weight: 36.5 kg (80 lb 9 oz) Most recent weight: Weight: 36.5 kg (80 lb 8 oz)       Palliative Assessment/Data:    Flowsheet Rows   Flowsheet Row Most Recent Value  Intake Tab  Referral Department  Critical care  Unit at Time of Referral  ICU  Palliative Care Primary Diagnosis  Pulmonary  Date Notified  02/16/16  Palliative Care Type  New Palliative care  Reason for referral  Non-pain Symptom, Counsel Regarding Hospice, End of Life Care Assistance  Date of Admission  02/10/16  Date first seen by Palliative Care  02/17/16  # of days Palliative referral response time  1 Day(s)  # of days IP prior to Palliative referral  6  Clinical Assessment  Palliative Performance Scale Score  20%  Psychosocial & Spiritual Assessment  Palliative Care Outcomes  Patient/Family meeting held?  Yes  Who was at the meeting?  patient, dtr, nehew  Palliative Care Outcomes  Improved non-pain symptom therapy, Clarified goals of care, Counseled regarding hospice, Provided end of life care assistance      Patient Active  Problem List   Diagnosis Date Noted  . Palliative care encounter   . Goals of care, counseling/discussion   . Encounter for hospice care discussion   . Elevated troponin   . Acute on chronic respiratory failure with hypoxia ([place]) 02/10/2016  . Hypotension 02/10/2016  . Generalized weakness 02/10/2016  . Sepsis ([place]) 02/04/2016  . Malignant neoplasm of upper lobe of left lung ([place]) 08/05/2015  . Pneumothorax 06/24/2015  . Pneumothorax, left   . Protein-calorie malnutrition, severe ([place]) 06/24/2014  . Parapneumonic effusion 06/23/2014  . Failure to thrive in adult 06/23/2014  . Pneumonia due to infectious organism 06/22/2014    Palliative Care Plan    Recommendations/Plan:  Home with hospice services. Oxygen/hospital bed.  For D/c today.  Goals of Care and Additional Recommendations:  Limitations on Scope of Treatment: Full Comfort Care and Full Scope Treatment  Code Status:  DNR  Prognosis:   < 6 months due to lung cancer progression  Discharge Planning:  Home with hospice.  Care plan was discussed with patient, patient's family at bedside.  Thank you for allowing the Palliative Medicine Team to assist in the care of this patient.  Total time spent:  15 minutes     Greater than 50%  of this time was spent counseling and coordinating care related to the above assessment and plan.  Olene Floss, PA-S [facility]ogene [facility], [place] [facility] Pager: [phone]   Please contact Palliative MedicineTeam phone at [phone] for questions and concerns between 7 am - 7 pm.   Please see AMION for individual provider pager numbers.

## 2016-02-20 ENCOUNTER — Telehealth: Payer: Self-pay | Admitting: *Deleted

## 2016-02-20 NOTE — Telephone Encounter (Signed)
Ok, that is appropriate.  Thanks.

## 2016-02-20 NOTE — Telephone Encounter (Signed)
Pt recently hospitalized and is now home but is under hospice care. Pt requests to cancel all appts and will follow up as needed. Message sent to scheduling to cancel all appts.

## 2016-02-22 ENCOUNTER — Inpatient Hospital Stay
Admission: EM | Admit: 2016-02-22 | Discharge: 2016-02-24 | DRG: 871 | Disposition: A | Discharge status: Hospice | Attending: Internal Medicine | Admitting: Internal Medicine

## 2016-02-22 ENCOUNTER — Encounter: Payer: Self-pay | Admitting: Emergency Medicine

## 2016-02-22 ENCOUNTER — Emergency Department

## 2016-02-22 DIAGNOSIS — Z66 Do not resuscitate: Secondary | ICD-10-CM | POA: Diagnosis present

## 2016-02-22 DIAGNOSIS — R0602 Shortness of breath: Secondary | ICD-10-CM | POA: Diagnosis not present

## 2016-02-22 DIAGNOSIS — R0600 Dyspnea, unspecified: Secondary | ICD-10-CM | POA: Diagnosis not present

## 2016-02-22 DIAGNOSIS — R262 Difficulty in walking, not elsewhere classified: Secondary | ICD-10-CM

## 2016-02-22 DIAGNOSIS — J189 Pneumonia, unspecified organism: Secondary | ICD-10-CM | POA: Diagnosis present

## 2016-02-22 DIAGNOSIS — Z833 Family history of diabetes mellitus: Secondary | ICD-10-CM

## 2016-02-22 DIAGNOSIS — C3492 Malignant neoplasm of unspecified part of left bronchus or lung: Secondary | ICD-10-CM | POA: Diagnosis present

## 2016-02-22 DIAGNOSIS — Y95 Nosocomial condition: Secondary | ICD-10-CM | POA: Diagnosis present

## 2016-02-22 DIAGNOSIS — I10 Essential (primary) hypertension: Secondary | ICD-10-CM | POA: Diagnosis present

## 2016-02-22 DIAGNOSIS — Z7952 Long term (current) use of systemic steroids: Secondary | ICD-10-CM | POA: Diagnosis not present

## 2016-02-22 DIAGNOSIS — A419 Sepsis, unspecified organism: Secondary | ICD-10-CM | POA: Diagnosis not present

## 2016-02-22 DIAGNOSIS — M6281 Muscle weakness (generalized): Secondary | ICD-10-CM

## 2016-02-22 DIAGNOSIS — J441 Chronic obstructive pulmonary disease with (acute) exacerbation: Secondary | ICD-10-CM | POA: Diagnosis not present

## 2016-02-22 DIAGNOSIS — J9621 Acute and chronic respiratory failure with hypoxia: Secondary | ICD-10-CM | POA: Diagnosis not present

## 2016-02-22 DIAGNOSIS — F17210 Nicotine dependence, cigarettes, uncomplicated: Secondary | ICD-10-CM | POA: Diagnosis not present

## 2016-02-22 DIAGNOSIS — E43 Unspecified severe protein-calorie malnutrition: Secondary | ICD-10-CM | POA: Diagnosis present

## 2016-02-22 DIAGNOSIS — Z79899 Other long term (current) drug therapy: Secondary | ICD-10-CM | POA: Diagnosis not present

## 2016-02-22 DIAGNOSIS — Z8249 Family history of ischemic heart disease and other diseases of the circulatory system: Secondary | ICD-10-CM | POA: Diagnosis not present

## 2016-02-22 DIAGNOSIS — Z681 Body mass index (BMI) 19 or less, adult: Secondary | ICD-10-CM

## 2016-02-22 DIAGNOSIS — J440 Chronic obstructive pulmonary disease with acute lower respiratory infection: Secondary | ICD-10-CM | POA: Diagnosis present

## 2016-02-22 DIAGNOSIS — J9601 Acute respiratory failure with hypoxia: Secondary | ICD-10-CM | POA: Diagnosis not present

## 2016-02-22 DIAGNOSIS — Z9981 Dependence on supplemental oxygen: Secondary | ICD-10-CM

## 2016-02-22 DIAGNOSIS — Z923 Personal history of irradiation: Secondary | ICD-10-CM | POA: Diagnosis not present

## 2016-02-22 DIAGNOSIS — D638 Anemia in other chronic diseases classified elsewhere: Secondary | ICD-10-CM | POA: Diagnosis present

## 2016-02-22 DIAGNOSIS — C3490 Malignant neoplasm of unspecified part of unspecified bronchus or lung: Secondary | ICD-10-CM | POA: Diagnosis not present

## 2016-02-22 DIAGNOSIS — Z85118 Personal history of other malignant neoplasm of bronchus and lung: Secondary | ICD-10-CM | POA: Diagnosis not present

## 2016-02-22 DIAGNOSIS — Z515 Encounter for palliative care: Secondary | ICD-10-CM | POA: Diagnosis not present

## 2016-02-22 DIAGNOSIS — J449 Chronic obstructive pulmonary disease, unspecified: Secondary | ICD-10-CM | POA: Diagnosis not present

## 2016-02-22 LAB — BLOOD GAS, ARTERIAL
Acid-Base Excess: 15.7 mmol/L — ABNORMAL HIGH (ref 0.0–2.0)
BICARBONATE: 43.5 mmol/L — AB (ref 20.0–28.0)
DELIVERY SYSTEMS: POSITIVE
EXPIRATORY PAP: 10
FIO2: 1
INSPIRATORY PAP: 16
O2 Saturation: 73.3 %
Patient temperature: 37
RATE: 14 resp/min
pCO2 arterial: 67 mmHg (ref 32.0–48.0)
pH, Arterial: 7.42 (ref 7.350–7.450)
pO2, Arterial: 38 mmHg — CL (ref 83.0–108.0)

## 2016-02-22 LAB — CBC
HCT: 46.8 % (ref 40.0–52.0)
HEMOGLOBIN: 15 g/dL (ref 13.0–18.0)
MCH: 25.4 pg — AB (ref 26.0–34.0)
MCHC: 32 g/dL (ref 32.0–36.0)
MCV: 79.3 fL — AB (ref 80.0–100.0)
Platelets: 718 10*3/uL — ABNORMAL HIGH (ref 150–440)
RBC: 5.89 MIL/uL (ref 4.40–5.90)
RDW: 15.3 % — ABNORMAL HIGH (ref 11.5–14.5)
WBC: 23.8 10*3/uL — ABNORMAL HIGH (ref 3.8–10.6)

## 2016-02-22 LAB — COMPREHENSIVE METABOLIC PANEL
ALK PHOS: 106 U/L (ref 38–126)
ALT: 56 U/L (ref 17–63)
AST: 66 U/L — ABNORMAL HIGH (ref 15–41)
Albumin: 2.9 g/dL — ABNORMAL LOW (ref 3.5–5.0)
Anion gap: 9 (ref 5–15)
BUN: 21 mg/dL — ABNORMAL HIGH (ref 6–20)
CALCIUM: 9.2 mg/dL (ref 8.9–10.3)
CO2: 36 mmol/L — ABNORMAL HIGH (ref 22–32)
CREATININE: 0.52 mg/dL — AB (ref 0.61–1.24)
Chloride: 94 mmol/L — ABNORMAL LOW (ref 101–111)
Glucose, Bld: 123 mg/dL — ABNORMAL HIGH (ref 65–99)
Potassium: 4.5 mmol/L (ref 3.5–5.1)
Sodium: 139 mmol/L (ref 135–145)
Total Bilirubin: 0.7 mg/dL (ref 0.3–1.2)
Total Protein: 9 g/dL — ABNORMAL HIGH (ref 6.5–8.1)

## 2016-02-22 LAB — MRSA PCR SCREENING: MRSA BY PCR: NEGATIVE

## 2016-02-22 LAB — LACTIC ACID, PLASMA
Lactic Acid, Venous: 2.3 mmol/L (ref 0.5–1.9)
Lactic Acid, Venous: 2.1 mmol/L (ref 0.5–1.9)

## 2016-02-22 LAB — BRAIN NATRIURETIC PEPTIDE: B NATRIURETIC PEPTIDE 5: 143 pg/mL — AB (ref 0.0–100.0)

## 2016-02-22 LAB — TROPONIN I

## 2016-02-22 MED ORDER — CEFEPIME-DEXTROSE 2 GM/50ML IV SOLR
2.0000 g | Freq: Two times a day (BID) | INTRAVENOUS | Status: DC
  Administered 2016-02-22 – 2016-02-23 (×3): 2 g via INTRAVENOUS
  Filled 2016-02-22 (×6): qty 50

## 2016-02-22 MED ORDER — SODIUM CHLORIDE 0.9 % IV SOLN
INTRAVENOUS | Status: AC
  Administered 2016-02-22 – 2016-02-23 (×3): via INTRAVENOUS

## 2016-02-22 MED ORDER — PANTOPRAZOLE SODIUM 40 MG IV SOLR
40.0000 mg | Freq: Every day | INTRAVENOUS | Status: DC
  Administered 2016-02-22 – 2016-02-24 (×3): 40 mg via INTRAVENOUS
  Filled 2016-02-22 (×3): qty 40

## 2016-02-22 MED ORDER — ONDANSETRON HCL 4 MG PO TABS: 4.0000 mg | ORAL_TABLET | Freq: Four times a day (QID) | ORAL | Status: DC | PRN

## 2016-02-22 MED ORDER — METHYLPREDNISOLONE SODIUM SUCC 125 MG IJ SOLR
60.0000 mg | Freq: Four times a day (QID) | INTRAMUSCULAR | Status: DC
  Administered 2016-02-22 – 2016-02-23 (×4): 60 mg via INTRAVENOUS
  Filled 2016-02-22 (×4): qty 2

## 2016-02-22 MED ORDER — ONDANSETRON HCL 4 MG/2ML IJ SOLN: 4.0000 mg | Freq: Four times a day (QID) | INTRAMUSCULAR | Status: DC | PRN

## 2016-02-22 MED ORDER — MORPHINE SULFATE 10 MG/5ML PO SOLN: 5.0000 mg | ORAL | Status: DC | PRN

## 2016-02-22 MED ORDER — SODIUM CHLORIDE 0.9 % IV BOLUS (SEPSIS)
1000.0000 mL | Freq: Once | INTRAVENOUS | Status: AC
  Administered 2016-02-22: 1000 mL via INTRAVENOUS

## 2016-02-22 MED ORDER — IPRATROPIUM-ALBUTEROL 0.5-2.5 (3) MG/3ML IN SOLN
RESPIRATORY_TRACT | Status: AC
  Filled 2016-02-22: qty 6

## 2016-02-22 MED ORDER — CLOTRIMAZOLE-BETAMETHASONE 1-0.05 % EX CREA
1.0000 "application " | TOPICAL_CREAM | Freq: Two times a day (BID) | CUTANEOUS | Status: DC | PRN
  Filled 2016-02-22: qty 15

## 2016-02-22 MED ORDER — CEFEPIME-DEXTROSE 2 GM/50ML IV SOLR
2.0000 g | Freq: Once | INTRAVENOUS | Status: AC
  Administered 2016-02-22: 2 g via INTRAVENOUS
  Filled 2016-02-22: qty 50

## 2016-02-22 MED ORDER — VANCOMYCIN HCL 500 MG IV SOLR
500.0000 mg | INTRAVENOUS | Status: DC
  Administered 2016-02-23: 500 mg via INTRAVENOUS
  Filled 2016-02-22 (×2): qty 500

## 2016-02-22 MED ORDER — METHYLPREDNISOLONE SODIUM SUCC 125 MG IJ SOLR
125.0000 mg | Freq: Once | INTRAMUSCULAR | Status: AC
  Administered 2016-02-22: 125 mg via INTRAVENOUS
  Filled 2016-02-22: qty 2

## 2016-02-22 MED ORDER — IPRATROPIUM-ALBUTEROL 0.5-2.5 (3) MG/3ML IN SOLN
3.0000 mL | Freq: Once | RESPIRATORY_TRACT | Status: AC
  Administered 2016-02-22: 3 mL via RESPIRATORY_TRACT

## 2016-02-22 MED ORDER — LEVOFLOXACIN IN D5W 750 MG/150ML IV SOLN
750.0000 mg | Freq: Once | INTRAVENOUS | Status: AC
  Administered 2016-02-22: 750 mg via INTRAVENOUS
  Filled 2016-02-22: qty 150

## 2016-02-22 MED ORDER — IPRATROPIUM-ALBUTEROL 0.5-2.5 (3) MG/3ML IN SOLN
3.0000 mL | Freq: Four times a day (QID) | RESPIRATORY_TRACT | Status: DC
  Administered 2016-02-22: 20:00:00 3 mL via RESPIRATORY_TRACT
  Filled 2016-02-22: qty 3

## 2016-02-22 MED ORDER — SENNOSIDES-DOCUSATE SODIUM 8.6-50 MG PO TABS
1.0000 | ORAL_TABLET | Freq: Every evening | ORAL | Status: DC | PRN
  Administered 2016-02-22 – 2016-02-23 (×2): 1 via ORAL
  Filled 2016-02-22 (×2): qty 1

## 2016-02-22 MED ORDER — ALBUTEROL SULFATE (2.5 MG/3ML) 0.083% IN NEBU: 2.5000 mg | INHALATION_SOLUTION | RESPIRATORY_TRACT | Status: DC | PRN

## 2016-02-22 MED ORDER — VANCOMYCIN HCL IN DEXTROSE 1-5 GM/200ML-% IV SOLN
1000.0000 mg | Freq: Once | INTRAVENOUS | Status: AC
  Administered 2016-02-22: 1000 mg via INTRAVENOUS
  Filled 2016-02-22: qty 200

## 2016-02-22 MED ORDER — TRIAMCINOLONE ACETONIDE 0.1 % EX CREA
1.0000 "application " | TOPICAL_CREAM | Freq: Two times a day (BID) | CUTANEOUS | Status: DC | PRN
  Filled 2016-02-22: qty 15

## 2016-02-22 MED ORDER — LORAZEPAM 0.5 MG PO TABS
0.5000 mg | ORAL_TABLET | ORAL | Status: DC | PRN
  Administered 2016-02-22 – 2016-02-23 (×2): 0.5 mg via ORAL
  Filled 2016-02-22 (×2): qty 1

## 2016-02-22 NOTE — ED Notes (Signed)
Critical lactic reported to [facility]

## 2016-02-22 NOTE — Progress Notes (Signed)
Pharmacy Antibiotic Note  Rick Clark is a 71 y.o. male admitted on 02/22/2016 with HCAP.  Pharmacy has been consulted for cefepime and vancomycin dosing. Pt received vancomycin, cefepime and levofloxacin in the ED. Pt was discharged on 2/7 after being admitted and treated for CAP  Plan: Begin cefepime 2 g IV q 12 hours based on renal function for pseudomonal coverage  Begin vancomycin 500 mg q 18 hours (stacked dosing) with goal trough 15-20. Cmin at ss estimated to be 16.5 PK: t1/2 = 16.12 vd = 27.3 ke = 0.043 Trough ordered prior to fifth dose 2/13 @ 1130 MRSA PCR ordered  Weight: 85 lb 12.8 oz (38.9 kg)  Temp (24hrs), Avg:96.7 F (35.9 C), Min:96.7 F (35.9 C), Max:96.7 F (35.9 C)   Recent Labs Lab 02/17/16 0441 02/18/16 0445 02/19/16 0409 02/22/16 0913 02/22/16 0914  WBC  --   --   --  23.8*  --   CREATININE 0.37* 0.46* <0.30* 0.52*  --   LATICACIDVEN  --   --   --   --  2.3*    Estimated Creatinine Clearance: 45.9 mL/min (by C-G formula based on SCr of 0.52 mg/dL (L)).    No Known Allergies  Antimicrobials this admission: vancomycin 2/10 >>  cefepime 2/10 >>  Levofloxacin 2/10 x 1  Dose adjustments this admission:  Microbiology results: 2/10 BCx: pending   2/10 MRSA PCR: sent  Thank you for allowing pharmacy to be a part of this patient's care.  Darrow Bussing, PharmD Pharmacy Resident 02/22/2016 2:16 PM

## 2016-02-22 NOTE — ED Provider Notes (Signed)
[facility] Emergency Department Provider Note  Time seen: 9:03 AM  I have reviewed the triage vital signs and the nursing notes.   HISTORY  Chief Complaint Shortness of Breath    HPI Rick Clark is a 72 y.o. 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 The patient was recently discharged from the hospital 02/19/16 after an admission for pneumonia with acute respiratory failure and end-stage COPD. Patient went home on hospice care. EMS states the hospice nurse gave the patient morphine at home and shortly after he became very hypoxic. Patient has a oxygen saturation of 66% with a great waveform on 15 L via nonrebreather. Patient states mild shortness of breath denies any chest pain or abdominal pain. No leg pain or swelling. Patient is lying in bed calm and cooperative, no distress.  Past Medical History:  Diagnosis Date  . Asthma   . COPD (chronic obstructive pulmonary disease) ([place])   . H/O cardiovascular stress test    a. 02/2015 Myoview: no ischemia/infarct, EF 55-65%.  . Lung cancer [facility]) 1990   Patient states RUL Thoracotomy.  . Lung mass    a. spiculated LUL lung mass;  b. 01/2016 Chest CT: LUL nodule 0.9 x 0.9 cm - improved from 1.2 x 1.1 cm.  . Pneumonia    a. 11/2013 with admission 01/2016 as well.    Patient Active Problem List   Diagnosis Date Noted  . Palliative care encounter   . Goals of care, counseling/discussion   . Encounter for hospice care discussion   . Elevated troponin   . Acute on chronic respiratory failure with hypoxia ([place]) 02/10/2016  . Hypotension 02/10/2016  . Generalized weakness 02/10/2016  . Sepsis ([place]) 02/04/2016  . Malignant neoplasm of upper lobe of left lung ([place]) 08/05/2015  . Pneumothorax 06/24/2015  . Pneumothorax, left   . Protein-calorie malnutrition, severe ([place]) 06/24/2014  . Parapneumonic effusion 06/23/2014  .  Failure to thrive in adult 06/23/2014  . Pneumonia due to infectious organism 06/22/2014    Past Surgical History:  Procedure Laterality Date  . FLEXIBLE BRONCHOSCOPY Bilateral 09/18/2014   Procedure: FLEXIBLE BRONCHOSCOPY;  Surgeon: Allyne Gee, MD;  Location: [facility] ORS;  Service: Pulmonary;  Laterality: Bilateral;  . LUNG LOBECTOMY Right    Right upper lobe    Prior to Admission medications   Medication Sig Start Date End Date Taking? Authorizing Provider  albuterol (PROVENTIL HFA;VENTOLIN HFA) 108 (90 BASE) MCG/ACT inhaler Inhale 1 puff into the lungs every 6 (six) hours as needed for wheezing or shortness of breath.    Historical Provider, MD  aspirin 81 MG chewable tablet Chew 81 mg by mouth every morning.    Historical Provider, MD  budesonide-formoterol (SYMBICORT) 160-4.5 MCG/ACT inhaler Inhale 2 puffs into the lungs 2 (two) times daily.    Historical Provider, MD  clotrimazole-betamethasone (LOTRISONE) cream Apply 1 application topically 2 (two) times daily.  11/12/15   Historical Provider, MD  feeding supplement, ENSURE ENLIVE, (ENSURE ENLIVE) LIQD Take 237 mLs by mouth 2 (two) times daily between meals. 02/05/16   Bettey Costa, MD  nicotine (NICODERM CQ - DOSED IN MG/24 HOURS) 21 mg/24hr patch Place 1 patch (21 mg total) onto the skin daily. 02/19/16   Bettey Costa, MD  predniSONE (DELTASONE) 10 MG tablet Take 1 tablet (10 mg total) by mouth daily with breakfast. 60 mg PO (ORAL) x 2 days 50 mg PO (ORAL)  x 2 days 40 mg PO (ORAL)  x 2 days 30 mg PO  (ORAL)  x 2 days 20 mg PO  (ORAL) x 2 days 10 mg PO  (ORAL) x 2 days then stop 02/18/16   Bettey Costa, MD  tiotropium (SPIRIVA) 18 MCG inhalation capsule Place 18 mcg into inhaler and inhale daily.    Historical Provider, MD  triamcinolone cream (KENALOG) 0.1 % Apply 1 application topically 2 (two) times daily.  11/12/15   Historical Provider, MD    No Known Allergies  Family History  Problem Relation Age of Onset  . Coronary artery  disease Father   . Diabetes Mother     Social History Social History  Substance Use Topics  . Smoking status: Current Every Day Smoker    Packs/day: 0.50    Years: 55.00    Types: Cigarettes  . Smokeless tobacco: Never Used  . Alcohol use No    Review of Systems Constitutional: Negative for fever. Cardiovascular: Negative for chest pain. Respiratory: Mild shortness of breath. Gastrointestinal: Negative for abdominal pain Neurological: Negative for headache 10-point ROS otherwise negative.  ____________________________________________   PHYSICAL EXAM:  Constitutional: Alert and oriented. Well appearing and in no distress. Eyes: Normal exam ENT   Head: Normocephalic and atraumatic   Mouth/Throat: Mucous membranes are moist. Cardiovascular: Normal rate, regular rhythm. No murmur Respiratory: Mild tachypnea. Clear lung sounds were very decreased substantially bilaterally. No obvious wheezes rales or rhonchi. Gastrointestinal: Soft and nontender. No distention.  Very thin abdomen. Musculoskeletal: Nontender with normal range of motion in all extremities. No edema. Very thin extremities. Neurologic:  Normal speech and language. No gross focal neurologic deficits Skin:  Skin is warm, dry and intact.  Psychiatric: Mood and affect are normal.   ____________________________________________    EKG  EKG reviewed and interpreted by myself shows normal sinus rhythm at 95 bpm, narrow QRS, some right axis deviation largely normal intervals with nonspecific ST changes without ST elevation.  ____________________________________________    RADIOLOGY  COPD with continued right-sided pneumonia  ____________________________________________   INITIAL IMPRESSION / ASSESSMENT AND PLAN / ED COURSE  Pertinent labs & imaging results that were available during my care of the patient were reviewed by me and considered in my medical decision making (see chart for  details).  Patient presents the emergency department with shortness of breath. Patient has end-stage COPD with hospice care at home. Patient is currently 66% on 15 L nonrebreather. On BiPAP the patient is 56%. We'll obtain a portal chest x-ray to rule out pneumothorax, we'll obtain labs and continue to closely monitor in the emergency department while awaiting family arrival.  Daughter is now here who states they have had conversations about his directives, and at this time he wishes to be resuscitated if needed but does not want to be on life support. However the daughter states that the patient is going to die she wants him to be on life support until family could arrive to say their goodbyes.  So at this time patient is full code, but we will attempt to not intubate unless patient becomes comatose or stops breathing, or it becomes absolutely necessary (no other options), these are the patients wishes per daughter.  Patient with continued hypoxia around 80% on BiPAP. Patient has an elevated white blood cell count and continued pneumonia on the right side. Patient will be admitted to the hospital for further treatment.  CRITICAL CARE Performed by: Harvest Dark   Total critical care time: 30  minutes  Critical care time was exclusive of separately billable procedures and treating other patients.  Critical care was necessary to treat or prevent imminent or life-threatening deterioration.  Critical care was time spent personally by me on the following activities: development of treatment plan with patient and/or surrogate as well as nursing, discussions with consultants, evaluation of patient's response to treatment, examination of patient, obtaining history from patient or surrogate, ordering and performing treatments and interventions, ordering and review of laboratory studies, ordering and review of radiographic studies, pulse oximetry and re-evaluation of patient's  condition.   ____________________________________________   FINAL CLINICAL IMPRESSION(S) / ED DIAGNOSES  Dyspnea sepsis pneumonia   Harvest Dark, MD 02/22/16 1119

## 2016-02-22 NOTE — H&P (Addendum)
[facility] [facility] [place] NAME: Rick Clark    MR#:  [id number]  DATE OF BIRTH:  08-Dec-1945  DATE OF ADMISSION:  02/22/2016  PRIMARY CARE PHYSICIAN: [name], MD   REQUESTING/REFERRING PHYSICIAN: Harvest Dark, MD  CHIEF COMPLAINT:  SOB  HISTORY OF PRESENT ILLNESS:  Rick Clark  is a 71 y.o. male with a known history of End-stage COPD, chronic hypoxic respiratory failure on 6 L of oxygen via nasal cannula, lung cancer was just admitted to the hospital and recently discharged on 02/19/2016 with acute on chronic hypoxic respiratory failure secondary to end-stage COPD and pneumonia. The patient was discharged home with hospice care at that time. Patient was brought into the emergency department today as he was extremely short of breath and pulse ox was at 66%. He was placed on nonrebreather. Family members at bedside and wants to try BiPAP until other significant family members arrive and they are aware that the patient is actively dying. ED physician has discussed with the pulmonologist who has requested hospitalist to admit the patient and family is not considering life-support or intubation at this time  PAST MEDICAL HISTORY:   Past Medical History:  Diagnosis Date  . Asthma   . COPD (chronic obstructive pulmonary disease) ([place])   . H/O cardiovascular stress test    a. 02/2015 Myoview: no ischemia/infarct, EF 55-65%.  . Lung cancer [facility]) 1990   Patient states RUL Thoracotomy.  . Lung mass    a. spiculated LUL lung mass;  b. 01/2016 Chest CT: LUL nodule 0.9 x 0.9 cm - improved from 1.2 x 1.1 cm.  . Pneumonia    a. 11/2013 with admission 01/2016 as well.    PAST SURGICAL HISTOIRY:   Past Surgical History:  Procedure Laterality Date  . FLEXIBLE BRONCHOSCOPY Bilateral 09/18/2014   Procedure: FLEXIBLE BRONCHOSCOPY;  Surgeon: Allyne Gee, MD;  Location: [facility] ORS;  Service: Pulmonary;  Laterality: Bilateral;  . LUNG  LOBECTOMY Right    Right upper lobe    SOCIAL HISTORY:   Social History  Substance Use Topics  . Smoking status: Current Every Day Smoker    Packs/day: 0.50    Years: 55.00    Types: Cigarettes  . Smokeless tobacco: Never Used  . Alcohol use No    FAMILY HISTORY:   Family History  Problem Relation Age of Onset  . Coronary artery disease Father   . Diabetes Mother     DRUG ALLERGIES:  No Known Allergies  REVIEW OF SYSTEMS:  Review of system unobtainable as the patient is on BiPAP  MEDICATIONS AT HOME:   Prior to Admission medications   Medication Sig Start Date End Date Taking? Authorizing Provider  albuterol (PROVENTIL HFA;VENTOLIN HFA) 108 (90 BASE) MCG/ACT inhaler Inhale 1 puff into the lungs every 6 (six) hours as needed for wheezing or shortness of breath.   Yes Historical Provider, MD  clotrimazole-betamethasone (LOTRISONE) cream Apply 1 application topically 2 (two) times daily.  11/12/15  Yes Historical Provider, MD  ipratropium-albuterol (DUONEB) 0.5-2.5 (3) MG/3ML SOLN Take 3 mLs by nebulization every 8 (eight) hours as needed.   Yes Historical Provider, MD  LORazepam (ATIVAN) 0.5 MG tablet Take 0.5-1 mg by mouth every 4 (four) hours as needed for anxiety.   Yes Historical Provider, MD  MORPHINE SULFATE PO Take 0-20 mg by mouth every hour as needed for pain. Morphine Sulfate '100mg'$ /98m solution- Take 0.25 ml to 0.5 ml by mouth every 1  hour as needed for pain/dyspnea.   Yes Historical Provider, MD  predniSONE (DELTASONE) 10 MG tablet Take 1 tablet (10 mg total) by mouth daily with breakfast. 60 mg PO (ORAL) x 2 days 50 mg PO (ORAL)  x 2 days 40 mg PO (ORAL)  x 2 days 30 mg PO  (ORAL)  x 2 days 20 mg PO  (ORAL) x 2 days 10 mg PO  (ORAL) x 2 days then stop 02/18/16  Yes Sital Mody, MD  triamcinolone cream (KENALOG) 0.1 % Apply 1 application topically 2 (two) times daily.  11/12/15  Yes Historical Provider, MD  feeding supplement, ENSURE ENLIVE, (ENSURE ENLIVE) LIQD  Take 237 mLs by mouth 2 (two) times daily between meals. 02/05/16   Bettey Costa, MD  nicotine (NICODERM CQ - DOSED IN MG/24 HOURS) 21 mg/24hr patch Place 1 patch (21 mg total) onto the skin daily. Patient not taking: Reported on 02/22/2016 02/19/16   Bettey Costa, MD      VITAL SIGNS:  Blood pressure (!) 163/75, pulse (!) 108, temperature (!) 96.7 F (35.9 C), temperature source Rectal, resp. rate (!) 28, weight 38.9 kg (85 lb 12.8 oz), SpO2 (!) 81 %.  PHYSICAL EXAMINATION:  GENERAL:  71 y.o.-year-old patient lying in the bed with acute distress. Cachectic EYES: Pupils equal, round, reactive to light and accommodation. No scleral icterus.  HEENT: Head atraumatic, normocephalic. Oropharynx and nasopharynx clear.  NECK:  Supple, no jugular venous distention. No thyroid enlargement, no tenderness.  LUNGS: Diminished breath sounds bilaterally, diffuse wheezing and no rales,rhonchi or crepitation. Positive use of accessory muscles of respiration.  CARDIOVASCULAR: S1, S2 normal. No murmurs, rubs, or gallops.  ABDOMEN: Soft, nontender, nondistended. Bowel sounds present. No organomegaly or mass.  EXTREMITIES: No pedal edema, cyanosis, or clubbing.  NEUROLOGIC: Patient is on BiPAP . Gait not checked.  PSYCHIATRIC: The patient is alert and on BiPAP and hypoxic SKIN: No obvious rash, lesion, or ulcer.   LABORATORY PANEL:   CBC  Recent Labs Lab 02/22/16 0913  WBC 23.8*  HGB 15.0  HCT 46.8  PLT 718*   ------------------------------------------------------------------------------------------------------------------  Chemistries   Recent Labs Lab 02/22/16 0913  NA 139  K 4.5  CL 94*  CO2 36*  GLUCOSE 123*  BUN 21*  CREATININE 0.52*  CALCIUM 9.2  AST 66*  ALT 56  ALKPHOS 106  BILITOT 0.7   ------------------------------------------------------------------------------------------------------------------  Cardiac Enzymes  Recent Labs Lab 02/22/16 0913  TROPONINI <0.03    ------------------------------------------------------------------------------------------------------------------  RADIOLOGY:  Dg Chest Portable 1 View  Result Date: 02/22/2016 CLINICAL DATA:  Shortness of breath, hypoxia EXAM: PORTABLE CHEST 1 VIEW COMPARISON:  02/18/2016 FINDINGS: Diffuse right lung airspace disease again noted pattern with pneumonia. Left lung is clear. Heart is borderline in size. Probable small right effusion. Underlying COPD. IMPRESSION: COPD. Diffuse right lung airspace disease and small right effusion, unchanged. Electronically Signed   By: Rolm Baptise M.D.   On: 02/22/2016 09:21    EKG:   Orders placed or performed during the hospital encounter of 02/22/16  . EKG 12-Lead  . EKG 12-Lead    IMPRESSION AND PLAN:  Rick Clark  is a 71 y.o. male with a known history of End-stage COPD, chronic hypoxic respiratory failure on 6 L of oxygen via nasal cannula, lung cancer was just admitted to the hospital and recently discharged on 02/19/2016 with acute on chronic hypoxic respiratory failure secondary to end-stage COPD and pneumonia. The patient was discharged home with hospice care at that time. Patient  was brought into the emergency department today as he was extremely short of breath and pulse ox was at 66%. He was placed on nonrebreather. Family members at bedside and wants to try BiPAP  #Acute on chronic hypoxic respiratory failure with end-stage COPD, healthcare associated pneumonia and history of lung cancer Admit to MedSurg unit On BiPAP family is not considering intubation Patient is actively dying at this stage and family members are aware Wants to continue BiPAP until significant family members arrive  They'll consider comfort care measures if the patient's situation is getting deteriorated and if patient is turning around they would continue BiPAP  #Acute COPD exacerbation Solu-Medrol, nebulizer treatments Supportive treatment BiPAP pulmonology  consult  #Healthcare associated pneumonia Patient was just discharged from hospital on 02/19/2016 and was treated with antibiotics for community-acquired pneumonia We will get sputum culture and sensitivity Cefepime and vancomycin  #History of lung cancer At this point patient is DO NOT RESUSCITATE and family is considering hospice care and was discharged home in previous admission with hospice care   GI and DVT prophylaxis  All the records are reviewed and case discussed with ED provider. Management plans discussed with the patient, family and they are in agreement.  CODE STATUS: DNR,Daughter Katharine Look healthcare power of attorney  TOTAL TIME TAKING CARE OF THIS PATIENT: 45 minutes.   Note: This dictation was prepared with Dragon dictation along with smaller phrase technology. Any transcriptional errors that result from this process are unintentional.  Nicholes Mango M.D on 02/22/2016 at 11:43 AM  Between 7am to 6pm - Pager - [phone]  After 6pm go to [email or web address] - password [email or web address] [facility]  Office  [phone]  CC: Primary care physician; [name], MD

## 2016-02-22 NOTE — Progress Notes (Signed)
O2 sat checked - 98% on 6l - reduced to 5L. Respiratory notified.

## 2016-02-22 NOTE — Progress Notes (Signed)
Patient arrived in room  And settled in bed. Alert and oriented. On Bipap on arrival. O2 sats high 90's. Breaths shallow but no distress. Respiratory tech in room - changed patient to nasal cannula 6 L. O2 sat 90 at present. Lungs very diminished - loose cough - oral suction at bedside. IV fluids as ordered. Condom cath in place, draining well. Pink foam dressing on sacrum and back to protect skin. No distress noted.

## 2016-02-22 NOTE — Progress Notes (Signed)
Family Meeting Note  Advance Directive:yes  Today a meeting took place with the Patient,spouse , daughter Katharine Look who is the healthcare power of attorney in patient's room  Patient is unable to participate due RT:MYTRZN capacity bipap   The following clinical team members were present during this meeting:MD  The following were discussed:Patient's diagnosis: , Patient's progosis: Hours - Days and Goals for treatment: DNR, agreeable with palliative care. If no improvement on BiPAP ,after family members arrive in few hours will consider comfort care. They are aware of the very poor prognosis. If patient turns around on BiPAP would follow up with pulmonology  Additional follow-up to be provided: Hospitalist, palliative care, pulmonology  Time spent during discussion:20 minutes  Lainie Daubert, Illene Silver, MD

## 2016-02-22 NOTE — ED Triage Notes (Signed)
Patient from home with c/o dyspnea. Patient has hx lung cancer and copd. Patient's home hospice RN gave him '4mg'$  oral morphine and noted SPO2 in the "70's". EMS Arrival confirmed SPO2 and administered 15L NRB which resulted in oxygen saturation in the "mid 70's to low 80's" (per ems)   Patient arrives on 15L NRB with SPO2 @ 67%, RRT at beside. MD at bedside

## 2016-02-22 NOTE — Plan of Care (Signed)
Problem: Education: Goal: Knowledge of [place] [facility] Education information/materials will improve Outcome: Progressing Patient oriented to room and settled. Family at bedside

## 2016-02-23 LAB — CBC
HCT: 31.1 % — ABNORMAL LOW (ref 40.0–52.0)
HEMOGLOBIN: 10 g/dL — AB (ref 13.0–18.0)
MCH: 25.2 pg — ABNORMAL LOW (ref 26.0–34.0)
MCHC: 32 g/dL (ref 32.0–36.0)
MCV: 79 fL — ABNORMAL LOW (ref 80.0–100.0)
PLATELETS: 363 10*3/uL (ref 150–440)
RBC: 3.94 MIL/uL — AB (ref 4.40–5.90)
RDW: 14.9 % — ABNORMAL HIGH (ref 11.5–14.5)
WBC: 11.4 10*3/uL — ABNORMAL HIGH (ref 3.8–10.6)

## 2016-02-23 LAB — EXPECTORATED SPUTUM ASSESSMENT W REFEX TO RESP CULTURE

## 2016-02-23 LAB — EXPECTORATED SPUTUM ASSESSMENT W GRAM STAIN, RFLX TO RESP C

## 2016-02-23 MED ORDER — METHYLPREDNISOLONE SODIUM SUCC 125 MG IJ SOLR
60.0000 mg | Freq: Two times a day (BID) | INTRAMUSCULAR | Status: DC
  Administered 2016-02-23 – 2016-02-24 (×2): 60 mg via INTRAVENOUS
  Filled 2016-02-23 (×2): qty 2

## 2016-02-23 MED ORDER — NICOTINE 21 MG/24HR TD PT24
21.0000 mg | MEDICATED_PATCH | Freq: Every day | TRANSDERMAL | Status: DC
  Filled 2016-02-23: qty 1

## 2016-02-23 MED ORDER — ENSURE ENLIVE PO LIQD
237.0000 mL | Freq: Two times a day (BID) | ORAL | Status: DC
  Administered 2016-02-23 – 2016-02-24 (×2): 237 mL via ORAL

## 2016-02-23 MED ORDER — IPRATROPIUM-ALBUTEROL 0.5-2.5 (3) MG/3ML IN SOLN
3.0000 mL | Freq: Three times a day (TID) | RESPIRATORY_TRACT | Status: DC
  Administered 2016-02-23 – 2016-02-24 (×5): 3 mL via RESPIRATORY_TRACT
  Filled 2016-02-23 (×5): qty 3

## 2016-02-23 MED ORDER — ENOXAPARIN SODIUM 30 MG/0.3ML ~~LOC~~ SOLN
30.0000 mg | SUBCUTANEOUS | Status: DC
  Administered 2016-02-23: 30 mg via SUBCUTANEOUS
  Filled 2016-02-23: qty 0.3

## 2016-02-23 NOTE — Plan of Care (Signed)
Problem: Physical Regulation: Goal: Will remain free from infection Outcome: Progressing Pt receiving IV antibiotics  Problem: Nutrition: Goal: Adequate nutrition will be maintained Outcome: Progressing Pt now started on ensures

## 2016-02-23 NOTE — Progress Notes (Signed)
[facility] [place] NAME: Rick Clark    MR#:  [id number]  DATE OF BIRTH:  January 28, 1945  SUBJECTIVE:  CHIEF COMPLAINT:   Chief Complaint  Patient presents with  . Shortness of Breath   - appears very cachectic, third hospitalization in last month - family at bedside  REVIEW OF SYSTEMS:  Review of Systems  Constitutional: Positive for malaise/fatigue and weight loss. Negative for chills and fever.  HENT: Negative for hearing loss.   Eyes: Negative for blurred vision and double vision.  Respiratory: Positive for cough. Negative for shortness of breath and wheezing.   Cardiovascular: Negative for chest pain, palpitations and leg swelling.  Gastrointestinal: Negative for abdominal pain, constipation, diarrhea, nausea and vomiting.  Genitourinary: Negative for dysuria.  Musculoskeletal: Negative for myalgias.  Neurological: Negative for dizziness, speech change, focal weakness, seizures and headaches.  Psychiatric/Behavioral: Negative for depression.    DRUG ALLERGIES:  No Known Allergies  VITALS:  Blood pressure (!) 97/38, pulse 75, temperature 97.4 F (36.3 C), temperature source Oral, resp. rate 20, weight 38.9 kg (85 lb 12.8 oz), SpO2 96 %.  PHYSICAL EXAMINATION:  Physical Exam  GENERAL:  71 y.o.-year-old malnourished, cachectic patient lying in the bed with no acute distress.  EYES: Pupils equal, round, reactive to light and accommodation. No scleral icterus. Extraocular muscles intact.  HEENT: Head atraumatic, normocephalic. Oropharynx and nasopharynx clear.  NECK:  Supple, no jugular venous distention. No thyroid enlargement, no tenderness.  LUNGS: scant breath sounds bilaterally, right basilar rhonchi,  no wheezing, rales or crepitation. No use of accessory muscles of respiration.  CARDIOVASCULAR: S1, S2 normal. No murmurs, rubs, or gallops.  ABDOMEN: Soft, nontender, nondistended. Bowel sounds present. No organomegaly  or mass.  EXTREMITIES: No pedal edema, cyanosis, or clubbing.  NEUROLOGIC: Cranial nerves II through XII are intact. Muscle strength 5/5 in all extremities. Sensation intact. Gait not checked.  PSYCHIATRIC: The patient is alert and oriented. Does not speak much  SKIN: No obvious rash, lesion, or ulcer.    LABORATORY PANEL:   CBC  Recent Labs Lab 02/23/16 0431  WBC 11.4*  HGB 10.0*  HCT 31.1*  PLT 363   ------------------------------------------------------------------------------------------------------------------  Chemistries   Recent Labs Lab 02/22/16 0913  NA 139  K 4.5  CL 94*  CO2 36*  GLUCOSE 123*  BUN 21*  CREATININE 0.52*  CALCIUM 9.2  AST 66*  ALT 56  ALKPHOS 106  BILITOT 0.7   ------------------------------------------------------------------------------------------------------------------  Cardiac Enzymes  Recent Labs Lab 02/22/16 0913  TROPONINI <0.03   ------------------------------------------------------------------------------------------------------------------  RADIOLOGY:  Dg Chest Portable 1 View  Result Date: 02/22/2016 CLINICAL DATA:  Shortness of breath, hypoxia EXAM: PORTABLE CHEST 1 VIEW COMPARISON:  02/18/2016 FINDINGS: Diffuse right lung airspace disease again noted pattern with pneumonia. Left lung is clear. Heart is borderline in size. Probable small right effusion. Underlying COPD. IMPRESSION: COPD. Diffuse right lung airspace disease and small right effusion, unchanged. Electronically Signed   By: Rolm Baptise M.D.   On: 02/22/2016 09:21    EKG:   Orders placed or performed during the hospital encounter of 02/22/16  . EKG 12-Lead  . 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  #1 acute on chronic hypoxic respiratory  failure-secondary to right lower lobe pneumonia and COPD exacerbation. -Repeated admissions for the same. Discharged with hospice at home couple days ago. -Family still deciding about sending home back with hospice. Decreased the dose of his steroids. Continue cefepime and vancomycin for now -Check MRSA PCR in blood cultures. -Improving WBC -Continue oxygen support as needed. -Discussed and no further escalation of care. If patient's condition were to deteriorate, family agreeable for comfort measures in the hospital.  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 Follows at the cancer center. Status post radiation therapy. Currently on monitoring on telemetry.  #3 severe malnourishment-dietitian consult and ensure  #4 tobacco use disorder-nicotine patch  #5 DVT prophylaxis-Lovenox  #6 anemia-anemia of chronic disease. Baseline hemoglobin around 11. The drop since admission is purely dilutional. Recheck in a.m.   Discussed with son, daughter and other family members.   All the records are reviewed and case discussed with Care Management/Social Workerr. Management plans discussed with the patient, family and they are in agreement.  CODE STATUS: DNR  TOTAL TIME TAKING CARE OF THIS PATIENT: 38 minutes.   POSSIBLE D/C IN 1-2 DAYS, DEPENDING ON CLINICAL CONDITION.   Gladstone Lighter M.D on 02/23/2016 at 11:04 AM  Between 7am to 6pm - Pager - [phone]  After 6pm go to [email or web address] - password [email or web address] [facility]  Office  [phone]  CC: Primary care physician; [name], MD

## 2016-02-23 NOTE — Evaluation (Signed)
Physical Therapy Evaluation Patient Details Name: Rick Clark MRN: [id number] DOB: Jul 21, 1945 Today's Date: 02/23/2016   History of Present Illness  Rick Clark  is a 71 y.o. male with a known history of End-stage COPD, chronic hypoxic respiratory failure on 6 L of oxygen via nasal cannula, lung cancer was just admitted to the hospital and recently discharged on 02/19/2016 with acute on chronic hypoxic respiratory failure secondary to end-stage COPD and pneumonia.     Clinical Impression  Pt presents to PT with weakness, decreased functional mobility, 4-6L O2 requirements, and weight loss and would benefit from acute PT services to address objective findings.  Pt able to transfer to side of bed with Min A and tolerated amb for 5 feet on 6 L O2 with sats dropping into mid 70's. Sats rebounded to low 90's quickly with seated rest break.  Pt returned to bed with family at bedside.  Pt and family education on positioning to prevent skin breakdown and bed exercises for strength/ROM.  Recommend home with HHPT to assit with mobility and 27/7 supervision/assist.     Follow Up Recommendations Supervision/Assistance - 24 hour;Home health PT    Equipment Recommendations  Hospital bed;Wheelchair (measurements PT);Rolling walker with 5" wheels;3in1 (PT)    Recommendations for Other Services       Precautions / Restrictions Precautions Precautions: Fall Restrictions Weight Bearing Restrictions: No      Mobility  Bed Mobility Overal bed mobility: Needs Assistance Bed Mobility: Supine to Sit;Sit to Supine     Supine to sit: Min assist Sit to supine: Min assist   General bed mobility comments: Supine<>sit with Min A and HOB elevated to lift trunk off bed.  Transfers Overall transfer level: Needs assistance Equipment used: Rolling walker (2 wheeled) Transfers: Sit to/from Stand Sit to Stand: Min assist         General transfer comment: Slow to rise using bed to push  off  Ambulation/Gait Ambulation/Gait assistance: Min assist Ambulation Distance (Feet): 5 Feet Assistive device: Rolling walker (2 wheeled) Gait Pattern/deviations: Step-through pattern     General Gait Details: short step length, shuffling with increased work of breath on 6 L O2  Stairs            Wheelchair Mobility    Modified Rankin (Stroke Patients Only)       Balance Overall balance assessment: Needs assistance Sitting-balance support: No upper extremity supported;Feet unsupported Sitting balance-Leahy Scale: Fair   Postural control: Posterior lean Standing balance support: Bilateral upper extremity supported;During functional activity Standing balance-Leahy Scale: Fair                               Pertinent Vitals/Pain Pain Assessment: No/denies pain    Home Living Family/patient expects to be discharged to:: Private residence Living Arrangements: Other relatives (grandchildren) Available Help at Discharge: Family;Available 24 hours/day Type of Home: House Home Access: Stairs to enter Entrance Stairs-Rails: None Entrance Stairs-Number of Steps: 2 Home Layout: One level Home Equipment: Walker - 2 wheels;Bedside commode      Prior Function Level of Independence: Needs assistance   Gait / Transfers Assistance Needed: Has not been able to walk since last admission with d/c 2/7  ADL's / Homemaking Assistance Needed: assist for bathing and dressing  Comments: Pt was working part time and ambulating without AD up until a month ago when he was admitted to the hospital; since d/c home with hospice on 2/7 pt was  mostly bed bound.  Has not walked in about 12 days per family.     Hand Dominance        Extremity/Trunk Assessment   Upper Extremity Assessment Upper Extremity Assessment: Generalized weakness    Lower Extremity Assessment Lower Extremity Assessment: Generalized weakness    Cervical / Trunk Assessment Cervical / Trunk  Assessment: Kyphotic  Communication   Communication: Expressive difficulties (due to SOB)  Cognition Arousal/Alertness: Awake/alert Behavior During Therapy: WFL for tasks assessed/performed Overall Cognitive Status: Within Functional Limits for tasks assessed                      General Comments General comments (skin integrity, edema, etc.): very thin and cacehtic    Exercises Other Exercises Other Exercises: Demonstration and verbal review of supine B LE including AP, HS, GS, ABB/ADD, and SLR Other Exercises: Pt and family education of pressure relief with pillow under hips   Assessment/Plan    PT Assessment Patient needs continued PT services  PT Problem List Decreased strength;Decreased activity tolerance;Decreased balance;Decreased mobility;Cardiopulmonary status limiting activity;Decreased skin integrity          PT Treatment Interventions Gait training;Therapeutic activities;Therapeutic exercise;DME instruction;Balance training;Functional mobility training;Patient/family education    PT Goals (Current goals can be found in the Care Plan section)  Acute Rehab PT Goals Patient Stated Goal: to go home with hospice PT Goal Formulation: With patient Time For Goal Achievement: 03/17/16 Potential to Achieve Goals: Good    Frequency Min 2X/week   Barriers to discharge        Co-evaluation               End of Session Equipment Utilized During Treatment: Gait belt;Oxygen Activity Tolerance: Patient tolerated treatment well (limited by O2 demands) Patient left: in bed;with call bell/phone within reach;with nursing/sitter in room;with family/visitor present Nurse Communication: Mobility status         Time: 6967-8938 PT Time Calculation (min) (ACUTE ONLY): 39 min   Charges:   PT Evaluation $PT Eval Moderate Complexity: 1 Procedure PT Treatments $Gait Training: 8-22 mins $Therapeutic Exercise: 8-22 mins   PT G Codes:        Axelle Szwed A [place],  PT 02/23/2016, 1:45 PM

## 2016-02-23 NOTE — Consult Note (Signed)
Pulmonary Critical Care  Initial Consult Note   Rick Clark DJM:[id number] DOB: [birth year] DOA: 02/22/2016  Referring physician: Ricka Burdock, MD PCP: [name], MD   Chief Complaint: SOB  HPI: Rick Clark is a 71 y.o. male with severe COPD and also has h/o lung cancer came into the hospital for increased SOB. Patient has had cough and congestion noted. He has had a general decline in his status. Recently was in the hospital for COPD with pneumonia and was sent home with hospice. Patient came back because of worsening of symptoms. In addition his family states he left the hospital too soon. On initial admission he was started on BIPAP and now he is off BIPAP. Patient seems to be doing a little better now   Review of Systems:  Constitutional:  +weight loss, no night sweats, Fevers, chills, +fatigue.  HEENT:  No headaches, nasal congestion, post nasal drip,  Cardio-vascular:  No chest pain, palpitations  GI:  No heartburn, indigestion, abdominal pain, nausea, vomiting, diarrhea  Resp:  + shortness of breath with exertion and at rest. +productive cough, No coughing up of blood. +wheezing Skin:  no rash or lesions.  Musculoskeletal:  No joint pain or swelling.   Remainder ROS performed and is unremarkable other than noted in HPI  Past Medical History:  Diagnosis Date  . Asthma   . COPD (chronic obstructive pulmonary disease) ([place])   . H/O cardiovascular stress test    a. 02/2015 Myoview: no ischemia/infarct, EF 55-65%.  . Lung cancer [facility]) 1990   Patient states RUL Thoracotomy.  . Lung mass    a. spiculated LUL lung mass;  b. 01/2016 Chest CT: LUL nodule 0.9 x 0.9 cm - improved from 1.2 x 1.1 cm.  . Pneumonia    a. 11/2013 with admission 01/2016 as well.   Past Surgical History:  Procedure Laterality Date  . FLEXIBLE BRONCHOSCOPY Bilateral 09/18/2014   Procedure: FLEXIBLE BRONCHOSCOPY;  Surgeon: Allyne Gee, MD;  Location: [facility] ORS;  Service: Pulmonary;   Laterality: Bilateral;  . LUNG LOBECTOMY Right    Right upper lobe   Social History:  reports that he has been smoking Cigarettes.  He has a 27.50 pack-year smoking history. He has never used smokeless tobacco. He reports that he does not drink alcohol or use drugs.  No Known Allergies  Family History  Problem Relation Age of Onset  . Coronary artery disease Father   . Diabetes Mother     Prior to Admission medications   Medication Sig Start Date End Date Taking? Authorizing Provider  albuterol (PROVENTIL HFA;VENTOLIN HFA) 108 (90 BASE) MCG/ACT inhaler Inhale 1 puff into the lungs every 6 (six) hours as needed for wheezing or shortness of breath.   Yes Historical Provider, MD  clotrimazole-betamethasone (LOTRISONE) cream Apply 1 application topically 2 (two) times daily.  11/12/15  Yes Historical Provider, MD  ipratropium-albuterol (DUONEB) 0.5-2.5 (3) MG/3ML SOLN Take 3 mLs by nebulization every 8 (eight) hours as needed.   Yes Historical Provider, MD  LORazepam (ATIVAN) 0.5 MG tablet Take 0.5-1 mg by mouth every 4 (four) hours as needed for anxiety.   Yes Historical Provider, MD  MORPHINE SULFATE PO Take 0-20 mg by mouth every hour as needed for pain. Morphine Sulfate '100mg'$ /40m solution- Take 0.25 ml to 0.5 ml by mouth every 1 hour as needed for pain/dyspnea.   Yes Historical Provider, MD  predniSONE (DELTASONE) 10 MG tablet Take 1 tablet (10 mg total) by mouth daily with  breakfast. 60 mg PO (ORAL) x 2 days 50 mg PO (ORAL)  x 2 days 40 mg PO (ORAL)  x 2 days 30 mg PO  (ORAL)  x 2 days 20 mg PO  (ORAL) x 2 days 10 mg PO  (ORAL) x 2 days then stop 02/18/16  Yes Bettey Costa, MD  triamcinolone cream (KENALOG) 0.1 % Apply 1 application topically 2 (two) times daily.  11/12/15  Yes Historical Provider, MD  feeding supplement, ENSURE ENLIVE, (ENSURE ENLIVE) LIQD Take 237 mLs by mouth 2 (two) times daily between meals. 02/05/16   Bettey Costa, MD  nicotine (NICODERM CQ - DOSED IN MG/24 HOURS) 21  mg/24hr patch Place 1 patch (21 mg total) onto the skin daily. Patient not taking: Reported on 02/22/2016 02/19/16   Bettey Costa, MD   Physical Exam: Vitals:   02/22/16 1945 02/22/16 1959 02/23/16 0454 02/23/16 1350  BP:  (!) 102/42 (!) 97/38 (!) 105/33  Pulse:  90 75 96  Resp:  20 20   Temp:  97.4 F (36.3 C) 97.4 F (36.3 C) 97.5 F (36.4 C)  TempSrc:  Oral Oral Oral  SpO2: 98% 100% 96% 90%  Weight:        Wt Readings from Last 3 Encounters:  02/22/16 38.9 kg (85 lb 12.8 oz)  02/10/16 36.5 kg (80 lb 8 oz)  02/04/16 35 kg (77 lb 3.2 oz)    General:  Appears calm and comfortable Eyes: PERRL, normal lids, irises & conjunctiva ENT: grossly normal hearing, lips & tongue Neck: no LAD, masses or thyromegaly Cardiovascular: RRR, no m/r/g. No LE edema. Respiratory: coarse bilaterally, few rhonchi. Normal respiratory effort. Abdomen: soft, nontender Skin: no rash or induration seen on limited exam Musculoskeletal: grossly normal tone BUE/BLE Psychiatric: grossly normal mood and affect Neurologic: grossly non-focal.          Labs on Admission:  Basic Metabolic Panel:  Recent Labs Lab 02/17/16 0441 02/18/16 0445 02/19/16 0409 02/22/16 0913  NA  --  137 140 139  K  --  3.7 3.8 4.5  CL  --  96* 96* 94*  CO2  --  35* 36* 36*  GLUCOSE  --  167* 137* 123*  BUN  --  19 19 21*  CREATININE 0.37* 0.46* <0.30* 0.52*  CALCIUM  --  8.4* 8.5* 9.2   Liver Function Tests:  Recent Labs Lab 02/22/16 0913  AST 66*  ALT 56  ALKPHOS 106  BILITOT 0.7  PROT 9.0*  ALBUMIN 2.9*   No results for input(s): LIPASE, AMYLASE in the last 168 hours. No results for input(s): AMMONIA in the last 168 hours. CBC:  Recent Labs Lab 02/22/16 0913 02/23/16 0431  WBC 23.8* 11.4*  HGB 15.0 10.0*  HCT 46.8 31.1*  MCV 79.3* 79.0*  PLT 718* 363   Cardiac Enzymes:  Recent Labs Lab 02/22/16 0913  TROPONINI <0.03    BNP (last 3 results)  Recent Labs  02/22/16 0913  BNP 143.0*     ProBNP (last 3 results) No results for input(s): PROBNP in the last 8760 hours.  CBG: No results for input(s): GLUCAP in the last 168 hours.  Radiological Exams on Admission: Dg Chest Portable 1 View  Result Date: 02/22/2016 CLINICAL DATA:  Shortness of breath, hypoxia EXAM: PORTABLE CHEST 1 VIEW COMPARISON:  02/18/2016 FINDINGS: Diffuse right lung airspace disease again noted pattern with pneumonia. Left lung is clear. Heart is borderline in size. Probable small right effusion. Underlying COPD. IMPRESSION: COPD. Diffuse right lung  airspace disease and small right effusion, unchanged. Electronically Signed   By: Rolm Baptise M.D.   On: 02/22/2016 09:21    EKG: Independently reviewed.  Assessment/Plan Active Problems:   Acute respiratory failure with hypoxia (HCC)   1. Acute on Chronic respiratory failure with hypoxia Continue with oxygen therapy Right now is off bipap will monitor -titrated oxygen down as tolerated  2. COPD endstage -on inhalers will continue -continue steroids  3. Lung cancer -patient is on hospice -palliative care  Code Status: DNR  Family Communication: full family present Disposition Plan: home hospice  Time spent: 25mn    I have personally obtained a history, examined the patient, evaluated laboratory and imaging results, formulated the assessment and plan and placed orders.  The Patient requires high complexity decision making for assessment and support.    SAllyne Gee MD FPiedmont Columdus Regional NorthsidePulmonary Critical Care Medicine Sleep Medicine

## 2016-02-24 DIAGNOSIS — Z515 Encounter for palliative care: Secondary | ICD-10-CM

## 2016-02-24 DIAGNOSIS — J9601 Acute respiratory failure with hypoxia: Secondary | ICD-10-CM

## 2016-02-24 DIAGNOSIS — J441 Chronic obstructive pulmonary disease with (acute) exacerbation: Secondary | ICD-10-CM

## 2016-02-24 LAB — BASIC METABOLIC PANEL
Anion gap: 3 — ABNORMAL LOW (ref 5–15)
BUN: 24 mg/dL — AB (ref 6–20)
CHLORIDE: 103 mmol/L (ref 101–111)
CO2: 33 mmol/L — ABNORMAL HIGH (ref 22–32)
Calcium: 8 mg/dL — ABNORMAL LOW (ref 8.9–10.3)
Creatinine, Ser: 0.33 mg/dL — ABNORMAL LOW (ref 0.61–1.24)
GFR calc Af Amer: >60 mL/min (ref >60)
GFR calc non Af Amer: >60 mL/min (ref >60)
GLUCOSE: 141 mg/dL — AB (ref 65–99)
POTASSIUM: 3.3 mmol/L — AB (ref 3.5–5.1)
Sodium: 139 mmol/L (ref 135–145)

## 2016-02-24 LAB — CBC
HCT: 29.4 % — ABNORMAL LOW (ref 40.0–52.0)
HEMOGLOBIN: 9.5 g/dL — AB (ref 13.0–18.0)
MCH: 25.6 pg — AB (ref 26.0–34.0)
MCHC: 32.2 g/dL (ref 32.0–36.0)
MCV: 79.4 fL — AB (ref 80.0–100.0)
PLATELETS: 325 10*3/uL (ref 150–440)
RBC: 3.7 MIL/uL — AB (ref 4.40–5.90)
RDW: 14.9 % — ABNORMAL HIGH (ref 11.5–14.5)
WBC: 10.3 10*3/uL (ref 3.8–10.6)

## 2016-02-24 MED ORDER — AMOXICILLIN-POT CLAVULANATE 875-125 MG PO TABS: 1.0000 | ORAL_TABLET | Freq: Two times a day (BID) | ORAL | 0 refills | Status: AC

## 2016-02-24 MED ORDER — PREDNISONE 10 MG (21) PO TBPK: 10.0000 mg | ORAL_TABLET | Freq: Every day | ORAL | 0 refills | Status: AC

## 2016-02-24 MED ORDER — POTASSIUM CHLORIDE CRYS ER 20 MEQ PO TBCR
40.0000 meq | EXTENDED_RELEASE_TABLET | Freq: Once | ORAL | Status: AC
  Administered 2016-02-24: 40 meq via ORAL
  Filled 2016-02-24: qty 2

## 2016-02-24 MED ORDER — SENNOSIDES-DOCUSATE SODIUM 8.6-50 MG PO TABS: 1.0000 | ORAL_TABLET | Freq: Every evening | ORAL | 0 refills | Status: AC | PRN

## 2016-02-24 MED ORDER — DEXTROSE 5 % IV SOLN
2.0000 g | Freq: Two times a day (BID) | INTRAVENOUS | Status: DC
  Administered 2016-02-24: 2 g via INTRAVENOUS
  Filled 2016-02-24 (×3): qty 2

## 2016-02-24 NOTE — Clinical Social Work Note (Signed)
Pt is ready for discharge today and will go to [facility]. Hospice Liaison made arrangements for transfer. CSW signing off as no further needs identified.   Darden Dates, MSW, LCSW  Clinical Social Worker  [phone]

## 2016-02-24 NOTE — Consult Note (Signed)
Consultation Note Date: 02/24/2016   Patient Name: Rick Clark  DOB: 1945-06-29  MRN: [id number]  Age / Sex: 71 y.o., male  PCP: [name], MD Referring Physician: Gladstone Lighter, MD  Reason for Consultation: Establishing goals of care, Non pain symptom management and Psychosocial/spiritual support  HPI/Patient Profile: 71 y.o. male   admitted on 02/22/2016  with a known history of End-stage COPD, chronic hypoxic respiratory failure on 6 L of oxygen via nasal cannula, lung cancer was just admitted to the hospital and recently discharged on 02/19/2016 with acute on chronic hypoxic respiratory failure secondary to end-stage COPD and pneumonia.   The patient was discharged home with hospice care at that time.   Patient was brought into the emergency department today as he was extremely short of breath and pulse ox was at 66%. He was placed on nonrebreather.  Family face advanced directive decisions and anticipatory care needs   Clinical Assessment and Goals of Care:    This NP Wadie Lessen reviewed medical records, received report from team, assessed the patient and then meet at the patient's bedside along with his family to include three children  to discuss diagnosis, prognosis, GOC, EOL wishes disposition and options.  A detailed discussion was had today regarding advanced directives.  Concepts specific to code status, artifical feeding and hydration, continued IV antibiotics and rehospitalization was had.  The difference between an aggressive medical intervention path and a palliative comfort path was detailed.  Values and goals of care important to patient and family were attempted to be elicited.   Natural trajectory and expectations at EOL were discussed.  Questions and concerns addressed.   Family encouraged to call with questions or concerns.  PMT will continue to support  holistically.  HCPOA    SUMMARY OF RECOMMENDATIONS    Code Status/Advance Care Planning:  DNR   Palliative Prophylaxis:   Aspiration, Bowel Regimen, Delirium Protocol and Oral Care  Additional Recommendations (Limitations, Scope, Preferences):  Full Comfort Care  Psycho-social/Spiritual:   Desire for further Chaplaincy support:no  Additional Recommendations: Education on Hospice  Prognosis:   < 3 months, dependant on desire for life prolonging measures.  Discharge Planning:   After some discussion and patient's request family has decided to "try to take him home one more time".          Home with Hospice      Primary Diagnoses: Present on Admission: . Acute respiratory failure with hypoxia ([place])   I have reviewed the medical record, interviewed the patient and family, and examined the patient. The following aspects are pertinent.  Past Medical History:  Diagnosis Date  . Asthma   . COPD (chronic obstructive pulmonary disease) ([place])   . H/O cardiovascular stress test    a. 02/2015 Myoview: no ischemia/infarct, EF 55-65%.  . Lung cancer [facility]) 1990   Patient states RUL Thoracotomy.  . Lung mass    a. spiculated LUL lung mass;  b. 01/2016 Chest CT: LUL nodule 0.9 x 0.9 cm - improved from  1.2 x 1.1 cm.  . Pneumonia    a. 11/2013 with admission 01/2016 as well.   Social History   Social History  . Marital status: Single    Spouse name: N/A  . Number of children: N/A  . Years of education: N/A   Social History Main Topics  . Smoking status: Current Every Day Smoker    Packs/day: 0.50    Years: 55.00    Types: Cigarettes  . Smokeless tobacco: Never Used  . Alcohol use No  . Drug use: No  . Sexual activity: Yes   Other Topics Concern  . None   Social History Narrative   Lives at home with granddaughter.   Very weak   Family History  Problem Relation Age of Onset  . Coronary artery disease Father   . Diabetes Mother    Scheduled Meds: . ceFEPime  (MAXIPIME) IV  2 g Intravenous Q12H  . enoxaparin (LOVENOX) injection  30 mg Subcutaneous Q24H  . feeding supplement (ENSURE ENLIVE)  237 mL Oral BID BM  . ipratropium-albuterol  3 mL Nebulization TID  . methylPREDNISolone (SOLU-MEDROL) injection  60 mg Intravenous Q12H  . nicotine  21 mg Transdermal Daily  . pantoprazole (PROTONIX) IV  40 mg Intravenous Daily  . potassium chloride  40 mEq Oral Once   Continuous Infusions: PRN Meds:.albuterol, clotrimazole-betamethasone, LORazepam, morphine, ondansetron **OR** ondansetron (ZOFRAN) IV, senna-docusate, triamcinolone cream Medications Prior to Admission:  Prior to Admission medications   Medication Sig Start Date End Date Taking? Authorizing Provider  albuterol (PROVENTIL HFA;VENTOLIN HFA) 108 (90 BASE) MCG/ACT inhaler Inhale 1 puff into the lungs every 6 (six) hours as needed for wheezing or shortness of breath.   Yes Historical Provider, MD  clotrimazole-betamethasone (LOTRISONE) cream Apply 1 application topically 2 (two) times daily.  11/12/15  Yes Historical Provider, MD  ipratropium-albuterol (DUONEB) 0.5-2.5 (3) MG/3ML SOLN Take 3 mLs by nebulization every 8 (eight) hours as needed.   Yes Historical Provider, MD  LORazepam (ATIVAN) 0.5 MG tablet Take 0.5-1 mg by mouth every 4 (four) hours as needed for anxiety.   Yes Historical Provider, MD  MORPHINE SULFATE PO Take 0-20 mg by mouth every hour as needed for pain. Morphine Sulfate '100mg'$ /10m solution- Take 0.25 ml to 0.5 ml by mouth every 1 hour as needed for pain/dyspnea.   Yes Historical Provider, MD  predniSONE (DELTASONE) 10 MG tablet Take 1 tablet (10 mg total) by mouth daily with breakfast. 60 mg PO (ORAL) x 2 days 50 mg PO (ORAL)  x 2 days 40 mg PO (ORAL)  x 2 days 30 mg PO  (ORAL)  x 2 days 20 mg PO  (ORAL) x 2 days 10 mg PO  (ORAL) x 2 days then stop 02/18/16  Yes Sital Mody, MD  triamcinolone cream (KENALOG) 0.1 % Apply 1 application topically 2 (two) times daily.  11/12/15  Yes  Historical Provider, MD  feeding supplement, ENSURE ENLIVE, (ENSURE ENLIVE) LIQD Take 237 mLs by mouth 2 (two) times daily between meals. 02/05/16   SBettey Costa MD  nicotine (NICODERM CQ - DOSED IN MG/24 HOURS) 21 mg/24hr patch Place 1 patch (21 mg total) onto the skin daily. Patient not taking: Reported on 02/22/2016 02/19/16   SBettey Costa MD   No Known Allergies Review of Systems  Constitutional: Positive for fatigue.  Respiratory: Positive for shortness of breath.   Neurological: Positive for weakness.    Physical Exam  Constitutional: He appears cachectic. He appears ill. Nasal cannula in  place.  Cardiovascular: Tachycardia present.   Pulmonary/Chest: Tachypnea noted.  Musculoskeletal:  generalized weakness and atrophy  Skin: Skin is warm and dry.    Vital Signs: BP (!) 104/43 (BP Location: Left Arm)   Pulse 67   Temp 97.5 F (36.4 C) (Oral)   Resp 20   Wt 38.9 kg (85 lb 12.8 oz)   SpO2 96%   BMI 14.28 kg/m  Pain Assessment: No/denies pain   Pain Score: 0-No pain   SpO2: SpO2: 96 % O2 Device:SpO2: 96 % O2 Flow Rate: .O2 Flow Rate (L/min): 4 L/min  IO: Intake/output summary:  Intake/Output Summary (Last 24 hours) at 02/24/16 1109 Last data filed at 02/24/16 1024  Gross per 24 hour  Intake              600 ml  Output             1200 ml  Net             -600 ml    LBM: Last BM Date: 02/23/16 Baseline Weight: Weight: 38.9 kg (85 lb 12.8 oz) Most recent weight: Weight: 38.9 kg (85 lb 12.8 oz)     Palliative Assessment/Data: 30 % at best   Flowsheet Rows   Flowsheet Row Most Recent Value  Intake Tab  Referral Department  Hospitalist  Unit at Time of Referral  Oncology Unit  Palliative Care Primary Diagnosis  Cancer  Date Notified  02/22/16  Palliative Care Type  Return patient Palliative Care  Reason for referral  Clarify Goals of Care  Date of Admission  02/22/16  # of days IP prior to Palliative referral  0  Clinical Assessment  Psychosocial & Spiritual  Assessment  Palliative Care Outcomes     Discussed with dr Tressia Miners  Time In: 1000 Time Out: 1115 Time Total: 75 min Greater than 50%  of this time was spent counseling and coordinating care related to the above assessment and plan.  Signed by: Wadie Lessen, NP   Please contact Palliative Medicine Team phone at [phone] for questions and concerns.  For individual provider: See Shea Evans

## 2016-02-24 NOTE — Progress Notes (Signed)
Visit made. Patient is currently followed by [facility] with a hospice diagnosis of COPD. He was a DNR at discharge on 2/7, but chose to be a full code at home. Currently a DNR per hospital chart. Patient was readmitted to [facility] on 2/10 for evaluation of respiratory failure, he required Bi pap in the ED, is currently on 4 liters of oxygen via nasal cannula. Family and patient met with Palliative Medicine NP Wadie Lessen this morning prior to writer's visit, at that meeting family was planning on taking Mr. Coopman home and requested a suction machine.  Family approached writer in the hallway to express their concerns regarding patient's safety at home and discussed the possibility of him going to the hospice home. Patient wants to return home, family plans to speak with him again and Buyer, retail of the decision. [place] made aware. Thank you. [name] RN, BSN, [facility], [facility] [phone] c

## 2016-02-24 NOTE — Progress Notes (Signed)
[facility] [place] NAME: Rick Clark    MR#:  [id number]  DATE OF BIRTH:  02/03/1945  SUBJECTIVE:  CHIEF COMPLAINT:   Chief Complaint  Patient presents with  . Shortness of Breath   - feels stronger, family at bedside - awaiting palliative care consult  REVIEW OF SYSTEMS:  Review of Systems  Constitutional: Positive for malaise/fatigue and weight loss. Negative for chills and fever.  HENT: Negative for hearing loss.   Eyes: Negative for blurred vision and double vision.  Respiratory: Positive for cough. Negative for shortness of breath and wheezing.   Cardiovascular: Negative for chest pain, palpitations and leg swelling.  Gastrointestinal: Negative for abdominal pain, constipation, diarrhea, nausea and vomiting.  Genitourinary: Negative for dysuria.  Musculoskeletal: Negative for myalgias.  Neurological: Negative for dizziness, speech change, focal weakness, seizures and headaches.  Psychiatric/Behavioral: Negative for depression.    DRUG ALLERGIES:  No Known Allergies  VITALS:  Blood pressure (!) 104/43, pulse 67, temperature 97.5 F (36.4 C), temperature source Oral, resp. rate 20, weight 38.9 kg (85 lb 12.8 oz), SpO2 96 %.  PHYSICAL EXAMINATION:  Physical Exam  GENERAL:  71 y.o.-year-old malnourished, cachectic patient lying in the bed with no acute distress.  EYES: Pupils equal, round, reactive to light and accommodation. No scleral icterus. Extraocular muscles intact.  HEENT: Head atraumatic, normocephalic. Oropharynx and nasopharynx clear.  NECK:  Supple, no jugular venous distention. No thyroid enlargement, no tenderness.  LUNGS: scant breath sounds bilaterally, right basilar rhonchi,  no wheezing, rales or crepitation. No use of accessory muscles of respiration.  CARDIOVASCULAR: S1, S2 normal. No murmurs, rubs, or gallops.  ABDOMEN: Soft, nontender, nondistended. Bowel sounds present. No organomegaly or mass.    EXTREMITIES: No pedal edema, cyanosis, or clubbing.  NEUROLOGIC: Cranial nerves II through XII are intact. Muscle strength 5/5 in all extremities. Sensation intact. Gait not checked.  PSYCHIATRIC: The patient is alert and oriented.  SKIN: No obvious rash, lesion, or ulcer.    LABORATORY PANEL:   CBC  Recent Labs Lab 02/24/16 0419  WBC 10.3  HGB 9.5*  HCT 29.4*  PLT 325   ------------------------------------------------------------------------------------------------------------------  Chemistries   Recent Labs Lab 02/22/16 0913 02/24/16 0419  NA 139 139  K 4.5 3.3*  CL 94* 103  CO2 36* 33*  GLUCOSE 123* 141*  BUN 21* 24*  CREATININE 0.52* 0.33*  CALCIUM 9.2 8.0*  AST 66*  --   ALT 56  --   ALKPHOS 106  --   BILITOT 0.7  --    ------------------------------------------------------------------------------------------------------------------  Cardiac Enzymes  Recent Labs Lab 02/22/16 0913  TROPONINI <0.03   ------------------------------------------------------------------------------------------------------------------  RADIOLOGY:  No results found.  EKG:   Orders placed or performed during the hospital encounter of 02/22/16  . EKG 12-Lead  . EKG 12-Lead    ASSESSMENT AND PLAN:   71 year old male with past medical history significant for end-stage COPD on 4 L home oxygen, left upper lobe lung cancer which is stable, hypertension, recurrent admissions for pneumonia in the last month presents to hospital secondary to respiratory failure.  #1 acute on chronic hypoxic respiratory failure-secondary to right lower lobe pneumonia and COPD exacerbation. -Repeated admissions for the same. Discharged with hospice at home couple days ago. -Family Concerned about the level of care at home. Interested in hospice facility at this time. -Awaiting palliative care consultation for this same. Decreased the dose of his steroids.  -On cefepime. Discontinued  vancomycin- -blood cultures  are negative so far -Improving WBC -Continue oxygen support as needed. -Discussed and no further escalation of care. If patient's condition were to deteriorate, family agreeable for comfort measures in the hospital.  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 Follows at the cancer center. Status post radiation therapy. Currently on monitoring on telemetry.  #3 severe malnourishment-dietitian consult and ensure  #4 tobacco use disorder-nicotine patch  #5 DVT prophylaxis-Lovenox  #6 anemia-anemia of chronic disease. Baseline hemoglobin around 11. Hemoglobin at 9.5 today. No active bleeding. No indication for transfusion. Likely dilutional. Monitor  Discussed with son, daughter and other family members.   All the records are reviewed and case discussed with Care Management/Social Workerr. Management plans discussed with the patient, family and they are in agreement.  CODE STATUS: DNR  TOTAL TIME TAKING CARE OF THIS PATIENT: 35 minutes.   POSSIBLE D/C TODAY, DEPENDING ON CLINICAL CONDITION.   Gladstone Lighter M.D on 02/24/2016 at 9:44 AM  Between 7am to 6pm - Pager - [phone]  After 6pm go to [email or web address] - password [email or web address]  Office  [phone]  CC: Primary care physician; [name], MD

## 2016-02-24 NOTE — Progress Notes (Signed)
Pt has been discharge to hospice home. Discharge packet in pt's slot. AVS explained to pt and family. Pt and family verbalized understanding. AVS placed in discharge packet. Transported by EMS.

## 2016-02-24 NOTE — Care Management (Signed)
Admitted to [facility] with the diagnosis of sepsis. Lives with family. Daughter is Kathrin Greathouse [phone]). Discharged from this facility to home 02/19/16. Followed by [facility] in the home. Spoke with daughter at the bedside. States that Hospice has been to the home x 2 since  discharged 02/19/16. Requested a suction machine in the home. Palliative Care consult in progress. Shelbie Ammons RN MSN CCM Care Management

## 2016-02-24 NOTE — Progress Notes (Signed)
Writer spoke again with patient's family and with patient. He is agreeable to transfer to the hospice home today. Hospital care team made aware. Report called to the hospice home, updated notes faxed to referral, EMS notified for transport. Signed DNR in place in patient's discharge packet. Thank you. [name] RN, BSN, [facility], hospital Liaison [phone]

## 2016-02-24 NOTE — Discharge Summary (Addendum)
[facility] [place] NAME: Rick Clark    MR#:  [id number]  DATE OF BIRTH:  Sep 11, 1945  DATE OF ADMISSION:  02/22/2016   ADMITTING PHYSICIAN: Nicholes Mango, MD  DATE OF DISCHARGE: 02/24/2016  PRIMARY CARE PHYSICIAN: [name], MD   ADMISSION DIAGNOSIS:   COPD exacerbation ([place]) [J44.1] HCAP (healthcare-associated pneumonia) [J18.9] Sepsis, due to unspecified organism ([place]) [A41.9]  DISCHARGE DIAGNOSIS:   Active Problems:   Acute respiratory failure with hypoxia ([place])   SECONDARY DIAGNOSIS:   Past Medical History:  Diagnosis Date  . Asthma   . COPD (chronic obstructive pulmonary disease) ([place])   . H/O cardiovascular stress test    a. 02/2015 Myoview: no ischemia/infarct, EF 55-65%.  . Lung cancer [facility]) 1990   Patient states RUL Thoracotomy.  . Lung mass    a. spiculated LUL lung mass;  b. 01/2016 Chest CT: LUL nodule 0.9 x 0.9 cm - improved from 1.2 x 1.1 cm.  . Pneumonia    a. 11/2013 with admission 01/2016 as well.    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  #1 acute on chronic hypoxic respiratory failure-secondary to right lower lobe pneumonia and COPD exacerbation. -Repeated admissions for the same. Discharged with hospice at home couple days ago. -Family Concerned about the level of care at home. Interested in hospice facility at this time. -appreciate palliative care consultation for this same. On prednisone taper.  -received vancomycin and cefepime- discharged on augmentin -blood cultures are negative so far -Improving WBC -Continue oxygen support as needed. -Discussed and no further escalation of care. family agreeable for comfort measures.  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 Follows at the cancer center. Status post radiation therapy. Currently on monitoring on telemetry.  #3 severe malnourishment-dietitian consult and ensure  #4 tobacco use disorder-nicotine patch while in the hospital  #5 anemia-anemia of chronic disease. Baseline hemoglobin around 11. Hemoglobin at 9.5 today. No active bleeding. No indication for transfusion. Likely dilutional. Monitor  Discussed with son, daughter and other family members. Discharge to hospice home  DISCHARGE CONDITIONS:   Critical  CONSULTS OBTAINED:   Treatment Team:  Allyne Gee, MD  DRUG ALLERGIES:   No Known Allergies DISCHARGE MEDICATIONS:   Allergies as of 02/24/2016   No Known Allergies     Medication List    STOP taking these medications   predniSONE 10 MG tablet Commonly known as:  DELTASONE Replaced by:  predniSONE 10 MG (21) Tbpk tablet     TAKE these medications   albuterol 108 (90 Base) MCG/ACT inhaler Commonly known as:  PROVENTIL HFA;VENTOLIN HFA Inhale 1 puff into the lungs every 6 (six) hours as needed for wheezing or shortness of breath.   amoxicillin-clavulanate 875-125 MG tablet Commonly known as:  AUGMENTIN Take 1 tablet by mouth 2 (two) times daily. X 10 days   clotrimazole-betamethasone cream Commonly known as:  LOTRISONE Apply 1 application topically 2 (two) times daily.   feeding supplement (ENSURE ENLIVE) Liqd Take 237 mLs by mouth 2 (two) times daily between meals.   ipratropium-albuterol 0.5-2.5 (3) MG/3ML Soln Commonly known as:  DUONEB Take 3 mLs by nebulization every 8 (eight) hours as needed.  LORazepam 0.5 MG tablet Commonly known as:  ATIVAN Take 0.5-1 mg by mouth every 4 (four) hours as needed for anxiety.   MORPHINE SULFATE PO Take 0-20 mg by mouth every hour as needed for pain. Morphine Sulfate '100mg'$ /76m solution- Take 0.25 ml to 0.5 ml by mouth every 1 hour as needed for  pain/dyspnea.   nicotine 21 mg/24hr patch Commonly known as:  NICODERM CQ - dosed in mg/24 hours Place 1 patch (21 mg total) onto the skin daily.   predniSONE 10 MG (21) Tbpk tablet Commonly known as:  STERAPRED UNI-PAK 21 TAB Take 1 tablet (10 mg total) by mouth daily. 6 tabs PO x 1 day 5 tabs PO x 1 day 4 tabs PO x 1 day 3 tabs PO x 1 day 2 tabs PO x 1 day 1 tab PO x 1 day and stop Replaces:  predniSONE 10 MG tablet   senna-docusate 8.6-50 MG tablet Commonly known as:  Senokot-S Take 1 tablet by mouth at bedtime as needed for mild constipation.   triamcinolone cream 0.1 % Commonly known as:  KENALOG Apply 1 application topically 2 (two) times daily.            Durable Medical Equipment        Start     Ordered   02/24/16 1305  For home use only DME Suction  Once    Question:  Suction  Answer:  Oral   02/24/16 1304       DISCHARGE INSTRUCTIONS:   1. Discharge to hospice facility  DIET:   Cardiac diet  ACTIVITY:   Activity as tolerated  OXYGEN:   Home Oxygen: Yes.    Oxygen Delivery: 4 liters/min via Patient connected to nasal cannula oxygen  DISCHARGE LOCATION:   Hospice home   If you experience worsening of your admission symptoms, develop shortness of breath, life threatening emergency, suicidal or homicidal thoughts you must seek medical attention immediately by calling 911 or calling your MD immediately  if symptoms less severe.  You Must read complete instructions/literature along with all the possible adverse reactions/side effects for all the Medicines you take and that have been prescribed to you. Take any new Medicines after you have completely understood and accpet all the possible adverse reactions/side effects.   Please note  You were cared for by a hospitalist during your hospital stay. If you have any questions about your discharge medications or the care you received while you were in the hospital after you are discharged, you can call  the unit and asked to speak with the hospitalist on call if the hospitalist that took care of you is not available. Once you are discharged, your primary care physician will handle any further medical issues. Please note that NO REFILLS for any discharge medications will be authorized once you are discharged, as it is imperative that you return to your primary care physician (or establish a relationship with a primary care physician if you do not have one) for your aftercare needs so that they can reassess your need for medications and monitor your lab values.    On the day of Discharge:  VITAL SIGNS:   Blood pressure (!) 104/43, pulse 67, temperature 97.5 F (36.4 C), temperature source Oral, resp. rate 20, weight 38.9 kg (85 lb 12.8 oz), SpO2 96 %.  PHYSICAL EXAMINATION:    GENERAL:  71y.o.-year-old malnourished, cachectic patient lying in the bed with no acute distress.  EYES: Pupils equal, round, reactive to light  and accommodation. No scleral icterus. Extraocular muscles intact.  HEENT: Head atraumatic, normocephalic. Oropharynx and nasopharynx clear.  NECK:  Supple, no jugular venous distention. No thyroid enlargement, no tenderness.  LUNGS: scant breath sounds bilaterally, right basilar rhonchi,  no wheezing, rales or crepitation. No use of accessory muscles of respiration.  CARDIOVASCULAR: S1, S2 normal. No murmurs, rubs, or gallops.  ABDOMEN: Soft, nontender, nondistended. Bowel sounds present. No organomegaly or mass.  EXTREMITIES: No pedal edema, cyanosis, or clubbing.  NEUROLOGIC: Cranial nerves II through XII are intact. Muscle strength 5/5 in all extremities. Sensation intact. Gait not checked.  PSYCHIATRIC: The patient is alert and oriented.  SKIN: No obvious rash, lesion, or ulcer.    DATA REVIEW:   CBC  Recent Labs Lab 02/24/16 0419  WBC 10.3  HGB 9.5*  HCT 29.4*  PLT 325    Chemistries   Recent Labs Lab 02/22/16 0913 02/24/16 0419  NA 139 139  K 4.5  3.3*  CL 94* 103  CO2 36* 33*  GLUCOSE 123* 141*  BUN 21* 24*  CREATININE 0.52* 0.33*  CALCIUM 9.2 8.0*  AST 66*  --   ALT 56  --   ALKPHOS 106  --   BILITOT 0.7  --      Microbiology Results  Results for orders placed or performed during the hospital encounter of 02/22/16  Blood culture (routine x 2)     Status: None (Preliminary result)   Collection Time: 02/22/16  9:13 AM  Result Value Ref Range Status   Specimen Description BLOOD LEFT HAND  Final   Special Requests   Final    BOTTLES DRAWN AEROBIC AND ANAEROBIC  AEROBIC 2CC, ANAEROBIC 1CC   Culture NO GROWTH 2 DAYS  Final   Report Status PENDING  Incomplete  Blood culture (routine x 2)     Status: None (Preliminary result)   Collection Time: 02/22/16  9:14 AM  Result Value Ref Range Status   Specimen Description BLOOD RIGHT FOREARM  Final   Special Requests   Final    BOTTLES DRAWN AEROBIC AND ANAEROBIC  AEROBIC 5CC, ANAEROBIC 1CC   Culture NO GROWTH 2 DAYS  Final   Report Status PENDING  Incomplete  MRSA PCR Screening     Status: None   Collection Time: 02/22/16  6:22 PM  Result Value Ref Range Status   MRSA by PCR NEGATIVE NEGATIVE Final    Comment:        The GeneXpert MRSA Assay (FDA approved for NASAL specimens only), is one component of a comprehensive MRSA colonization surveillance program. It is not intended to diagnose MRSA infection nor to guide or monitor treatment for MRSA infections.   Culture, sputum-assessment     Status: None   Collection Time: 02/22/16 11:36 PM  Result Value Ref Range Status   Specimen Description SPUTUM  Final   Special Requests NONE  Final   Sputum evaluation THIS SPECIMEN IS ACCEPTABLE FOR SPUTUM CULTURE  Final   Report Status 02/23/2016 FINAL  Final  Culture, respiratory (NON-Expectorated)     Status: None (Preliminary result)   Collection Time: 02/22/16 11:36 PM  Result Value Ref Range Status   Specimen Description SPUTUM  Final   Special Requests NONE Reflexed from  [id number]  Final   Gram Stain   Final    MODERATE WBC PRESENT, PREDOMINANTLY PMN FEW GRAM POSITIVE COCCI IN CLUSTERS RARE GRAM POSITIVE RODS    Culture   Final    CULTURE REINCUBATED FOR BETTER GROWTH  Performed at [facility], [place] [address] [place], [place] [address]    Report Status PENDING  Incomplete    RADIOLOGY:  No results found.   Management plans discussed with the patient, family and they are in agreement.  CODE STATUS:     Code Status Orders        Start     Ordered   02/22/16 7416  Do not attempt resuscitation (DNR)  Continuous    Question Answer Comment  In the event of cardiac or respiratory ARREST Do not call a "code blue"   In the event of cardiac or respiratory ARREST Do not perform Intubation, CPR, defibrillation or ACLS   In the event of cardiac or respiratory ARREST Use medication by any route, position, wound care, and other measures to relive pain and suffering. May use oxygen, suction and manual treatment of airway obstruction as needed for comfort.   Comments RN may pronounce      02/22/16 1644    Code Status History    Date Active Date Inactive Code Status Order ID Comments User Context   02/15/2016  9:05 AM 02/19/2016  4:30 PM DNR [id number]  Wilhelmina Mcardle, MD Inpatient   02/10/2016  2:03 PM 02/15/2016  9:05 AM Full Code [id number]  Theodoro Grist, MD Inpatient   02/04/2016  4:53 PM 02/05/2016  7:57 PM Full Code [id number]  Demetrios Loll, MD Inpatient   06/24/2015  5:15 PM 06/28/2015  1:45 PM Full Code [id number]  [name], MD Inpatient   05/19/2015  4:26 PM 05/22/2015  1:29 PM Full Code [id number]  Gladstone Lighter, MD ED   06/25/2014  2:53 PM 07/02/2014  8:33 PM Full Code [id number]  Inez Catalina, MD Inpatient   06/22/2014 10:59 PM 06/25/2014  2:53 PM Full Code [id number]  Harrie Foreman, MD ED    Advance Directive Documentation   Flowsheet Row Most Recent Value  Type of Advance Directive  Healthcare Power of Attorney, Living will  Pre-existing out of  facility DNR order (yellow form or pink MOST form)  No data  "MOST" Form in Place?  No data      TOTAL TIME TAKING CARE OF THIS PATIENT: 38 minutes.    Gladstone Lighter M.D on 02/24/2016 at 1:27 PM  Between 7am to 6pm - Pager - [phone]  After 6pm go to [email or web address] - Proofreader  [facility]  Office  [phone]  CC: Primary care physician; [name], MD   Note: This dictation was prepared with Dragon dictation along with smaller phrase technology. Any transcriptional errors that result from this process are unintentional.

## 2016-02-24 NOTE — Care Management Important Message (Signed)
Important Message  Patient Details  Name: Rick Clark MRN: [id number] Date of Birth: October 11, 1945   Medicare Important Message Given:  Yes    Shelbie Ammons, RN 02/24/2016, 8:30 AM

## 2016-02-25 LAB — CULTURE, RESPIRATORY

## 2016-02-25 LAB — CULTURE, RESPIRATORY W GRAM STAIN: Culture: NORMAL

## 2016-02-27 LAB — CULTURE, BLOOD (ROUTINE X 2)
Culture: NO GROWTH
Culture: NO GROWTH

## 2016-03-02 DIAGNOSIS — J441 Chronic obstructive pulmonary disease with (acute) exacerbation: Secondary | ICD-10-CM

## 2016-03-02 DIAGNOSIS — Z515 Encounter for palliative care: Secondary | ICD-10-CM

## 2016-03-03 ENCOUNTER — Other Ambulatory Visit: Payer: PPO

## 2016-03-03 ENCOUNTER — Ambulatory Visit: Payer: PPO

## 2016-03-05 ENCOUNTER — Ambulatory Visit: Payer: PPO | Admitting: Oncology

## 2016-03-12 DEATH — deceased

## 2016-08-07 ENCOUNTER — Ambulatory Visit: Payer: PPO | Admitting: Radiation Oncology

## 2017-05-03 IMAGING — CR DG CHEST 2V
1 series · 2 of 2 positions shown · non-contrast
Comparison: 06/26/2014

CLINICAL DATA: Pleural effusion, chest tube

EXAM:
CHEST  2 VIEW

[Series 1: dg chest 2 view · 0.14mm/px · 2 of 2 slices shown]
[im 1/2]
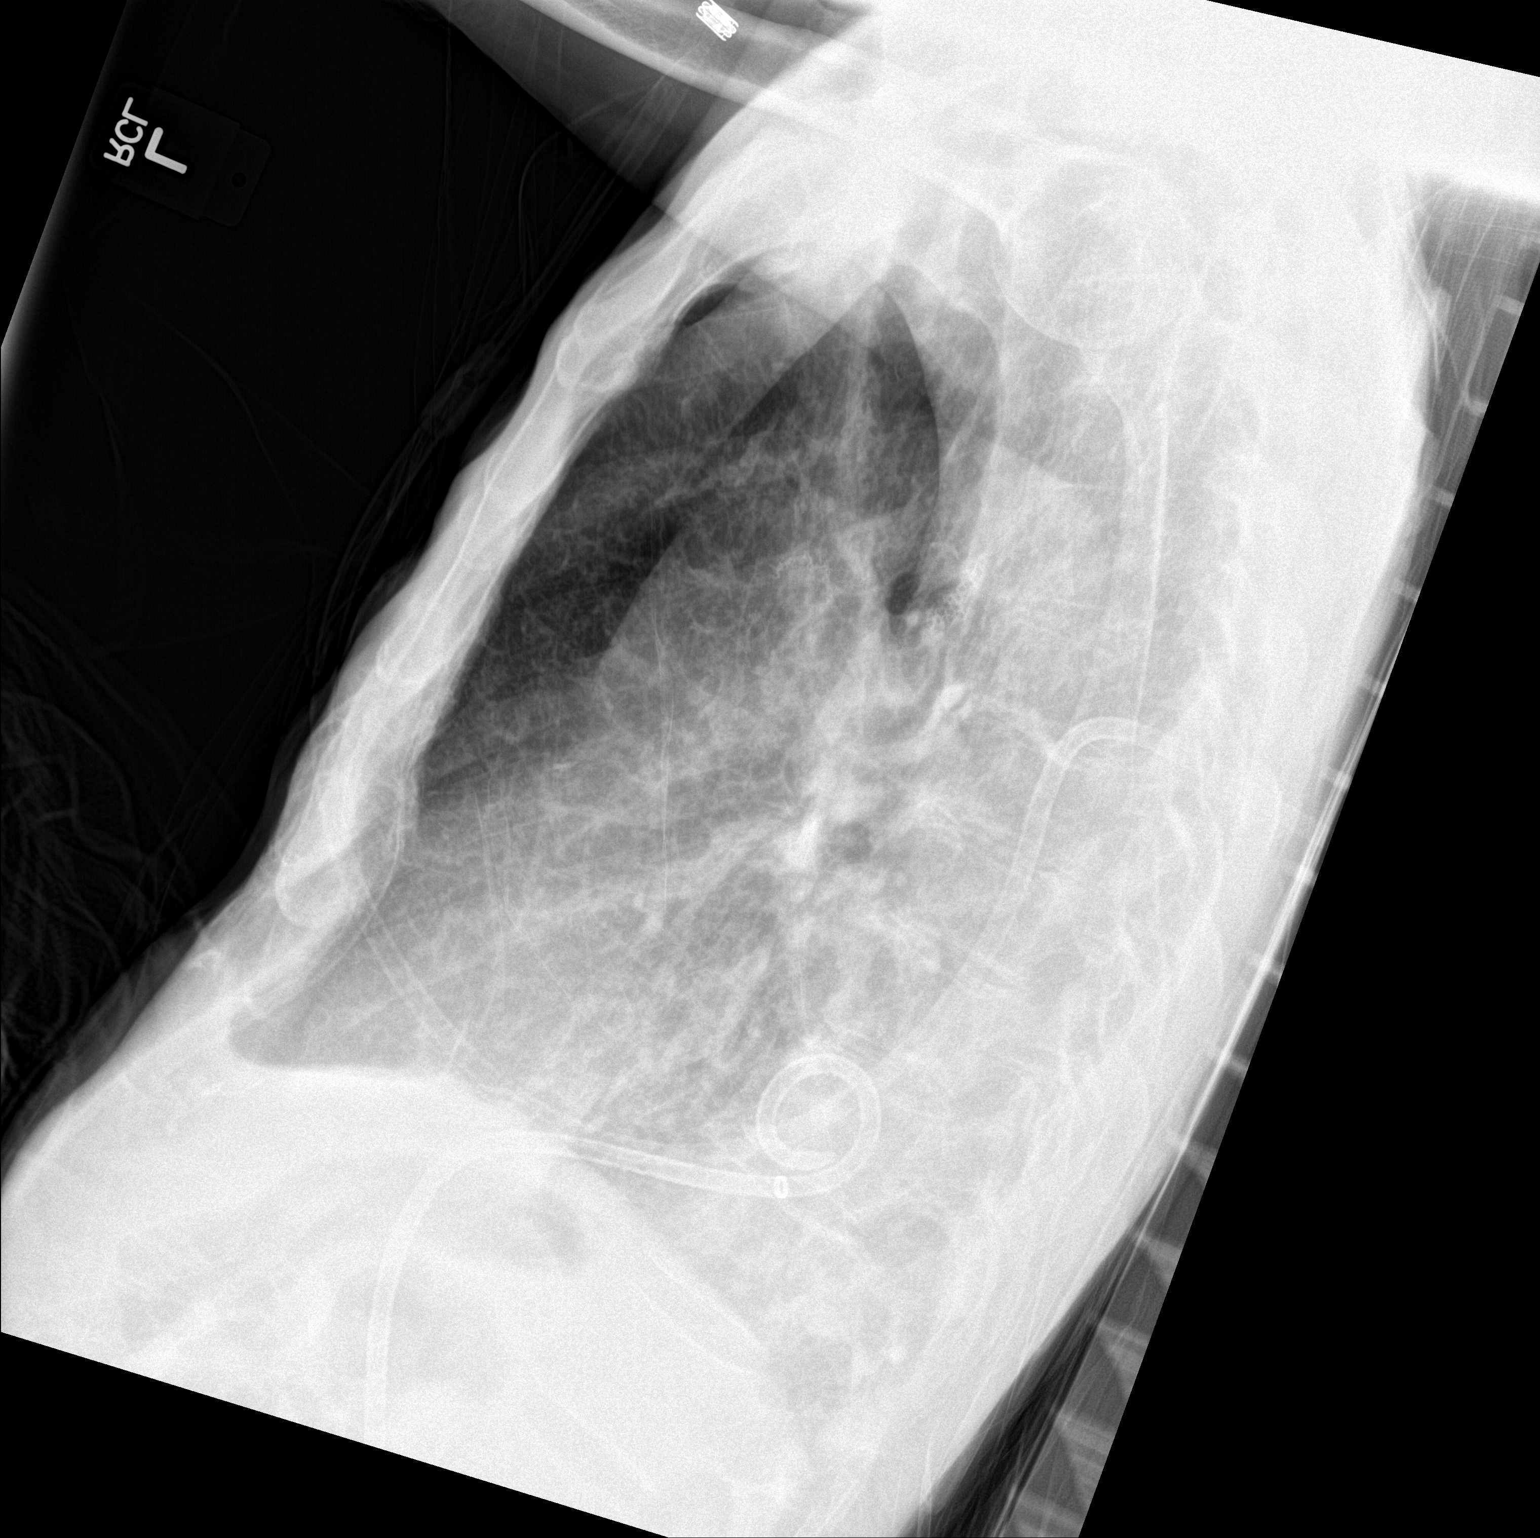
[im 2/2]
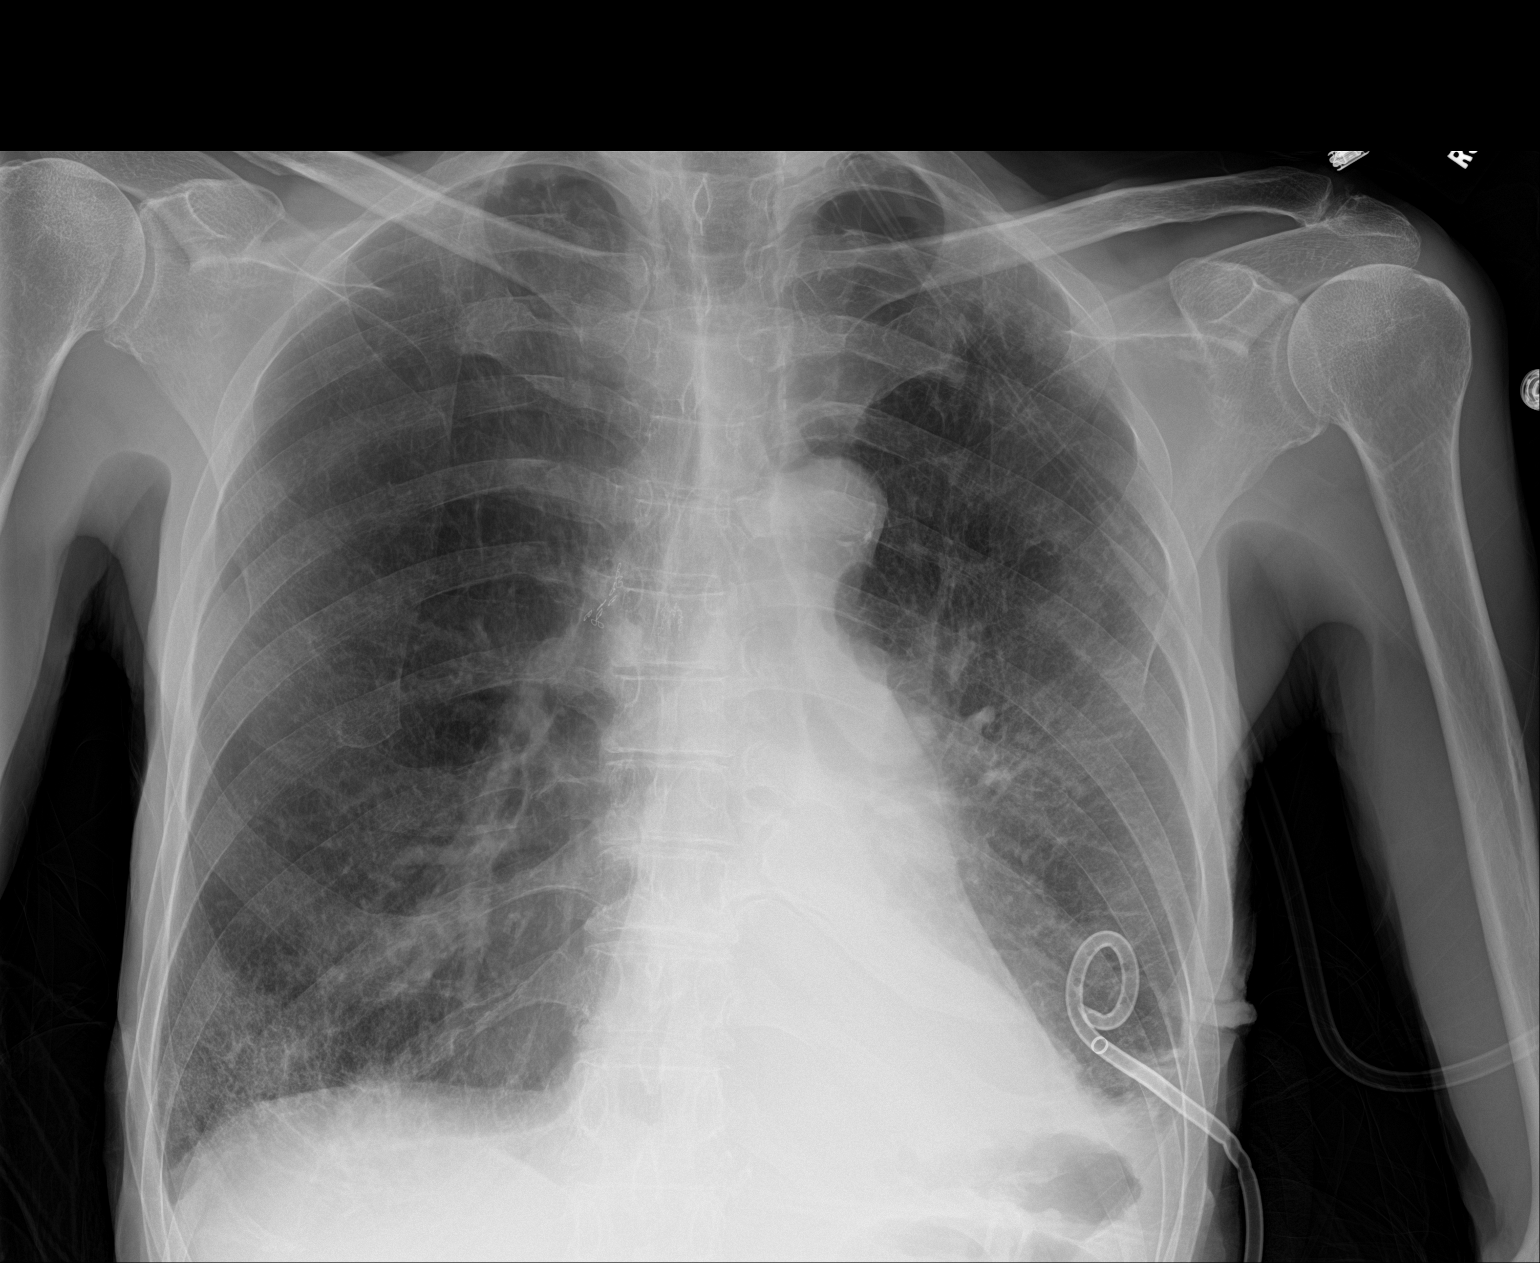

[2 of 2 positions shown; findings below may reference images not displayed]

FINDINGS: Cardiomediastinal silhouette is stable. Hyperinflation again noted.
Residual bilateral small basilar infiltrates with improvement from
prior exam. Stable left basilar pigtail pleural catheter. There is
no pneumothorax. No pulmonary edema.
IMPRESSION: Hyperinflation again noted. Residual bilateral basilar infiltrates
with improvement from prior exam. Stable left basilar pigtail
pleural catheter. No pneumothorax.

## 2018-04-28 IMAGING — CT CT CHEST W/ CM
2 of 3 series · 16 of 46 positions shown, 18 images · IV contrast (iopamidol)
Comparison: 05/19/2015.

CLINICAL DATA: Lung cancer, cough for 1 week, left upper lobe mass.

EXAM:
CT CHEST WITH CONTRAST
TECHNIQUE: Multidetector CT imaging of the chest was performed during
intravenous contrast administration.
CONTRAST:  75mL OJPFJO-CHH IOPAMIDOL (OJPFJO-CHH) INJECTION 61%

[Series 2: routine chest with · axial · 0.61mm/px · z∈[-17,+298]mm · 13 of 73 slices shown, 15 images]
[im 5/73  soft-tissue]
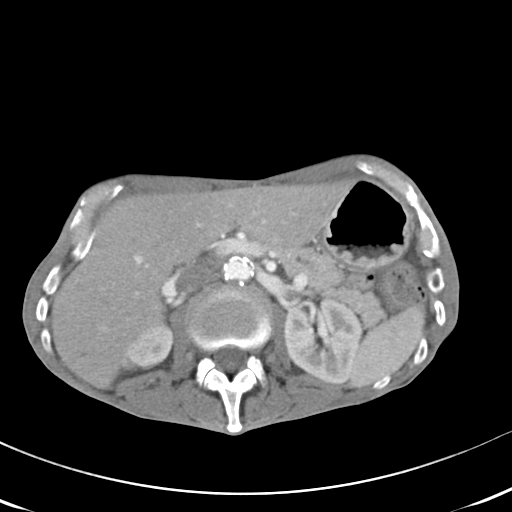
[im 5/73  bone]
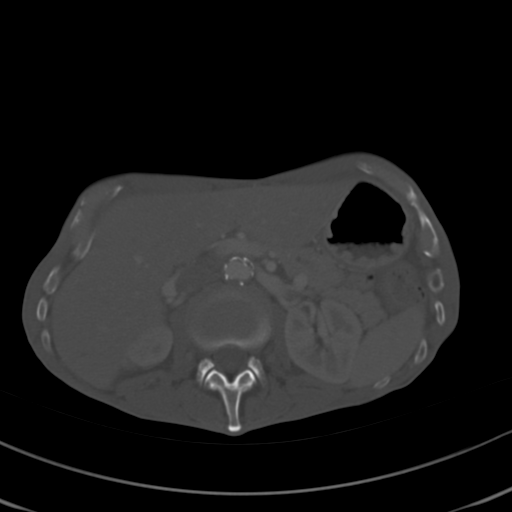
[im 10/73  soft-tissue]
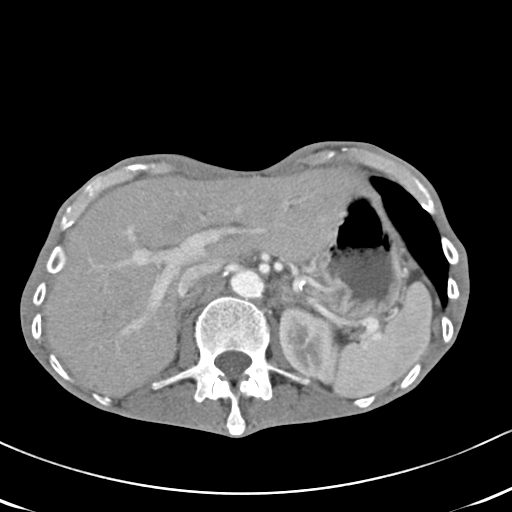
[im 14/73  soft-tissue]
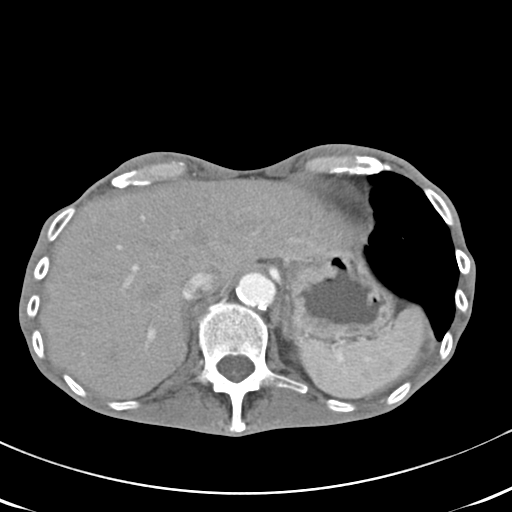
[im 21/73  soft-tissue]
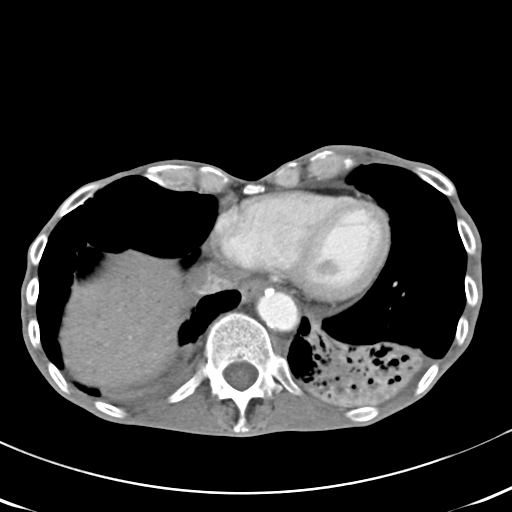
[im 26/73  soft-tissue]
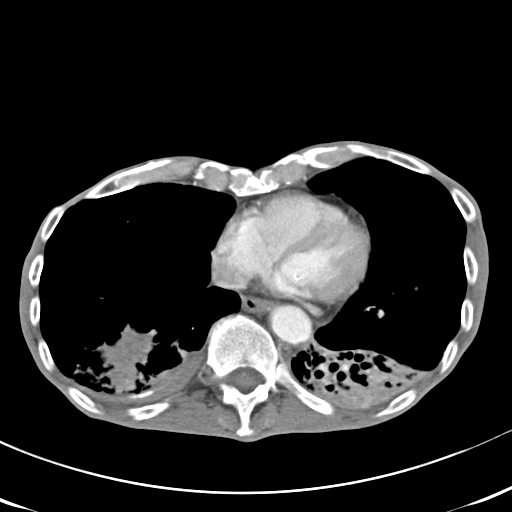
[im 31/73  soft-tissue]
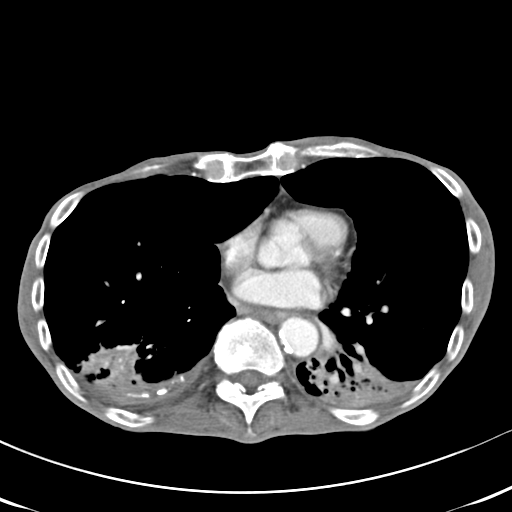
[im 38/73  soft-tissue]
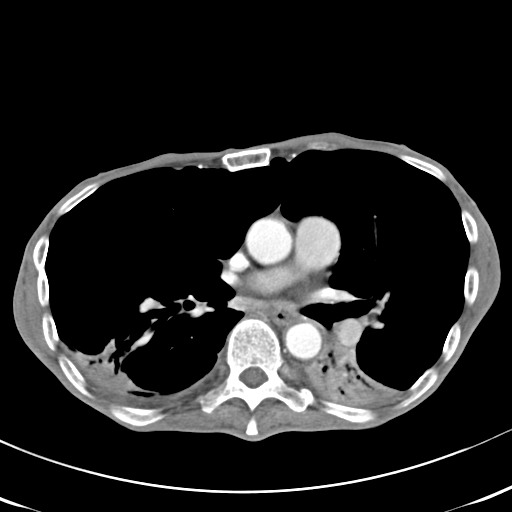
[im 42/73  soft-tissue]
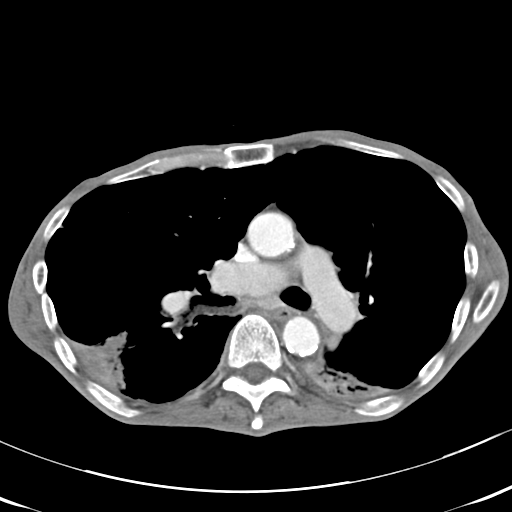
[im 47/73  soft-tissue]
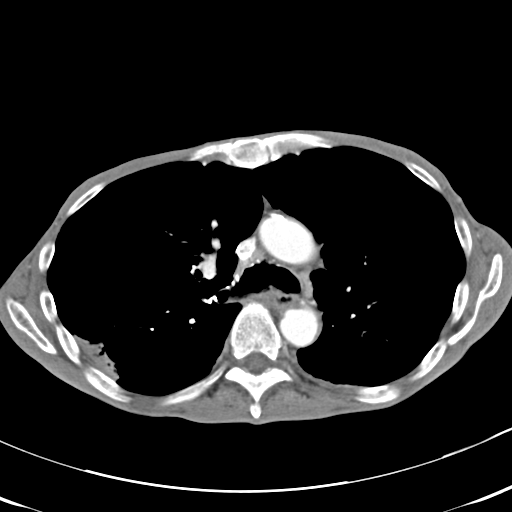
[im 47/73  bone]
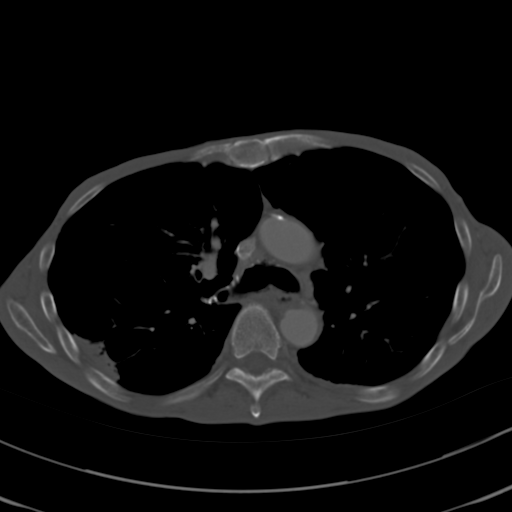
[im 52/73  soft-tissue]
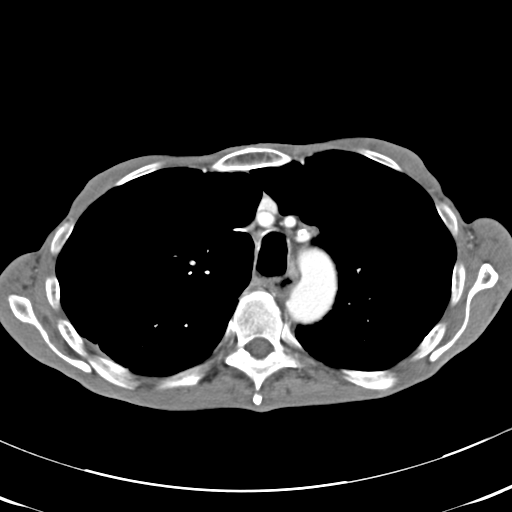
[im 59/73  soft-tissue]
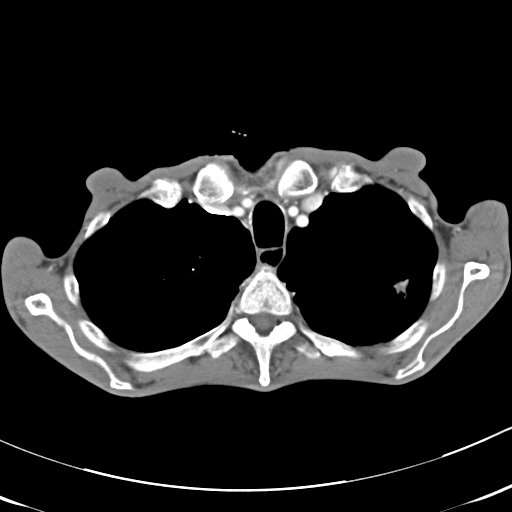
[im 63/73  soft-tissue]
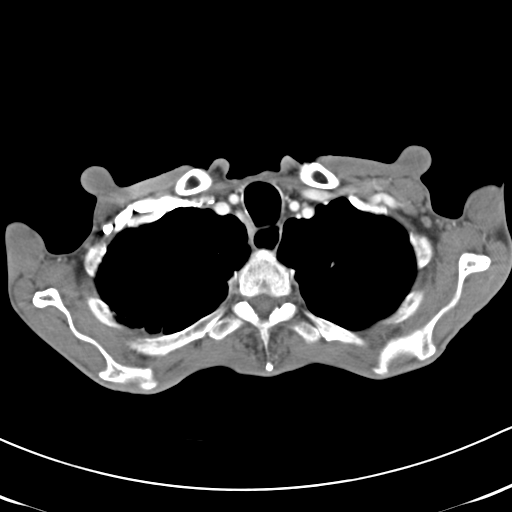
[im 68/73  soft-tissue]
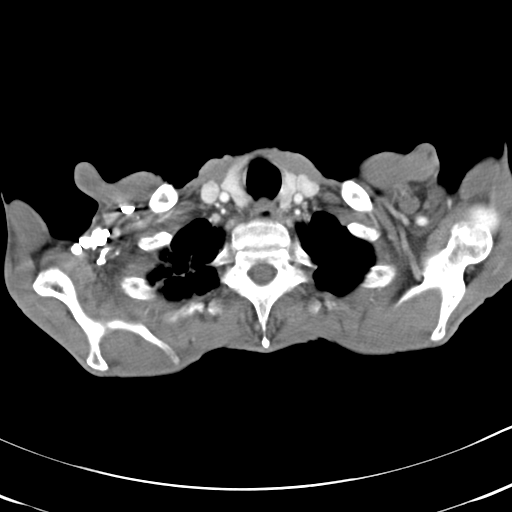

[Series 5: routine chest with cor · coronal · 0.59mm/px · 3 of 93 slices shown]
[im 31/93  soft-tissue]
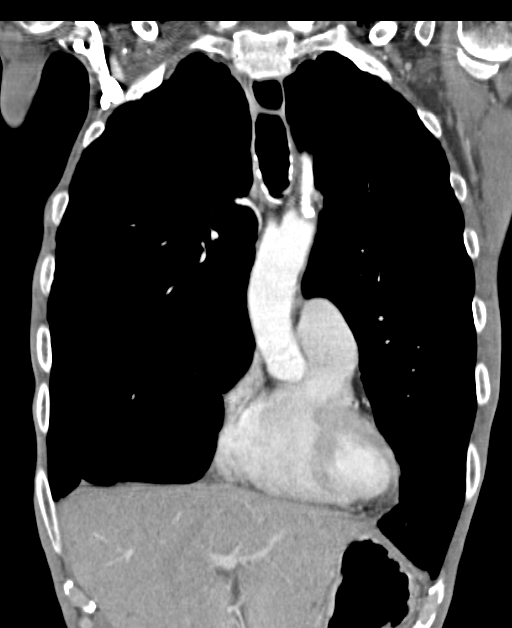
[im 41/93  soft-tissue]
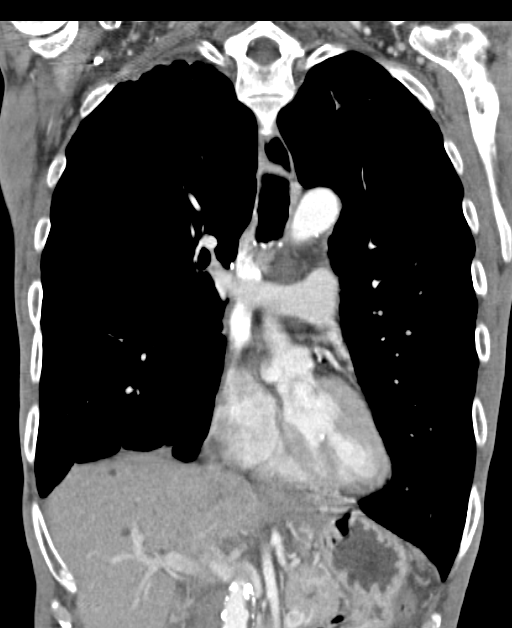
[im 52/93  soft-tissue]
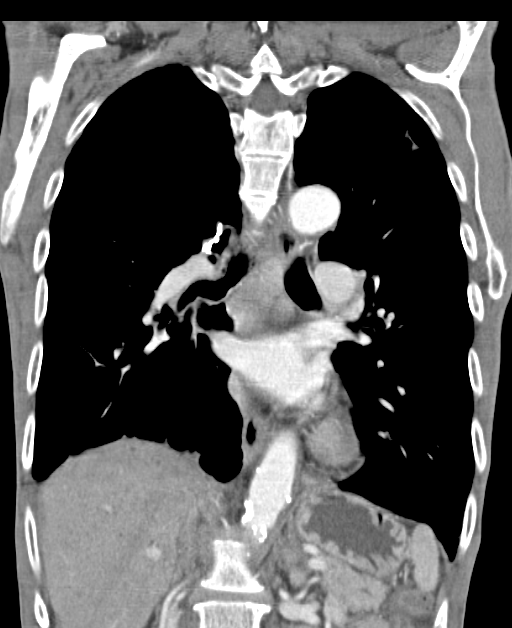

[16 of 46 positions shown; findings below may reference images not displayed]

FINDINGS: Mediastinum/Nodes: No pathologically enlarged mediastinal, hilar or
axillary lymph nodes. Atherosclerotic calcification of the arterial
vasculature, including coronary arteries. Heart size normal. Small
amount of pericardial fluid appears slightly increased from
05/19/2015.

Lungs/Pleura: Spiculated left upper lobe nodule measures 1.3 x
cm, previously 0.9 x 1.4 cm. Small to moderate left pneumothorax,
new. Severe centrilobular and paraseptal emphysema.
Peribronchovascular nodularity in the left lower lobe is new.
Collapse/ consolidation in the left lower lobe persists. Spiculated
masslike consolidation in the right lower lobe, slightly improved.
Trace right pleural effusion with pleural thickening. Debris is seen
dependently in the lower trachea and both mainstem bronchi.

Upper abdomen: Numerous sub cm low-attenuation lesions are again
seen in the liver and are too small to characterize. Biliary
hamartomas can have this appearance. Right adrenal gland is
unremarkable. There may be left adrenal thickening, stable.
Visualized portions of the kidneys, spleen, pancreas, stomach and
bowel are grossly unremarkable.

Musculoskeletal: No worrisome lytic or sclerotic lesions.
IMPRESSION: 1. New small to moderate left pneumothorax, possibly due to bleb
rupture. Critical Value/emergent results were called by telephone at
the time of interpretation on 06/24/2015 at [DATE] to LORENZ JUMPER,
PA , who verbally acknowledged these results.
2. Spiculated left upper lobe nodule, stable to minimally larger
than on 05/19/2015.
3. Spiculated masslike consolidation in the right lower lobe,
slightly improved, suggesting resolving pneumonia.
4. Persistent left lower lobe collapse/consolidation.
5. Small amount of pericardial fluid appears slightly increased from
05/19/2015.
6. Ill-defined peribronchovascular nodularity in the left lower lobe
is new and most indicative of an infectious bronchiolitis.
